# Patient Record
Sex: Female | Born: 1970
Health system: Southern US, Community
[De-identification: ages and names within clinical notes are randomized; demographics above are authoritative.]

## PROBLEM LIST (undated history)

## (undated) DIAGNOSIS — F32A Depression, unspecified: Secondary | ICD-10-CM

## (undated) DIAGNOSIS — A499 Bacterial infection, unspecified: Secondary | ICD-10-CM

## (undated) DIAGNOSIS — J45909 Unspecified asthma, uncomplicated: Secondary | ICD-10-CM

## (undated) DIAGNOSIS — Z87442 Personal history of urinary calculi: Secondary | ICD-10-CM

## (undated) DIAGNOSIS — F329 Major depressive disorder, single episode, unspecified: Secondary | ICD-10-CM

## (undated) DIAGNOSIS — K219 Gastro-esophageal reflux disease without esophagitis: Secondary | ICD-10-CM

## (undated) DIAGNOSIS — I10 Essential (primary) hypertension: Secondary | ICD-10-CM

## (undated) DIAGNOSIS — K5792 Diverticulitis of intestine, part unspecified, without perforation or abscess without bleeding: Secondary | ICD-10-CM

## (undated) DIAGNOSIS — Z8711 Personal history of peptic ulcer disease: Secondary | ICD-10-CM

## (undated) DIAGNOSIS — E785 Hyperlipidemia, unspecified: Secondary | ICD-10-CM

## (undated) DIAGNOSIS — Z8719 Personal history of other diseases of the digestive system: Secondary | ICD-10-CM

## (undated) DIAGNOSIS — R519 Headache, unspecified: Secondary | ICD-10-CM

## (undated) DIAGNOSIS — R51 Headache: Secondary | ICD-10-CM

## (undated) DIAGNOSIS — N390 Urinary tract infection, site not specified: Secondary | ICD-10-CM

## (undated) HISTORY — DX: Essential (primary) hypertension: I10

## (undated) HISTORY — DX: Bacterial infection, unspecified: N39.0

## (undated) HISTORY — DX: Bacterial infection, unspecified: A49.9

## (undated) HISTORY — PX: TONSILLECTOMY: SUR1361

## (undated) HISTORY — DX: Personal history of peptic ulcer disease: Z87.11

## (undated) HISTORY — DX: Personal history of other diseases of the digestive system: Z87.19

## (undated) HISTORY — DX: Unspecified asthma, uncomplicated: J45.909

## (undated) HISTORY — DX: Gastro-esophageal reflux disease without esophagitis: K21.9

## (undated) HISTORY — DX: Personal history of urinary calculi: Z87.442

## (undated) HISTORY — DX: Headache: R51

## (undated) HISTORY — DX: Hyperlipidemia, unspecified: E78.5

## (undated) HISTORY — DX: Major depressive disorder, single episode, unspecified: F32.9

## (undated) HISTORY — DX: Diverticulitis of intestine, part unspecified, without perforation or abscess without bleeding: K57.92

## (undated) HISTORY — PX: LEEP: SHX91

## (undated) HISTORY — DX: Headache, unspecified: R51.9

## (undated) HISTORY — DX: Depression, unspecified: F32.A

---
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
## 2001-07-06 ENCOUNTER — Inpatient Hospital Stay (HOSPITAL_COMMUNITY): Admission: AD | Admit: 2001-07-06 | Discharge: 2001-07-08 | Payer: Self-pay | Admitting: *Deleted

## 2001-07-07 ENCOUNTER — Encounter: Payer: Self-pay | Admitting: Obstetrics & Gynecology

## 2003-07-26 HISTORY — PX: ABDOMINAL HYSTERECTOMY: SHX81

## 2010-07-25 HISTORY — PX: HERNIA REPAIR: SHX51

## 2010-11-11 ENCOUNTER — Other Ambulatory Visit: Payer: Self-pay | Admitting: Surgery

## 2010-11-19 ENCOUNTER — Ambulatory Visit: Payer: Self-pay | Admitting: Surgery

## 2011-06-03 ENCOUNTER — Ambulatory Visit: Payer: Self-pay | Admitting: Unknown Physician Specialty

## 2011-06-06 ENCOUNTER — Ambulatory Visit: Payer: Self-pay | Admitting: Unknown Physician Specialty

## 2011-06-07 LAB — PATHOLOGY REPORT

## 2011-11-28 LAB — HM COLONOSCOPY: HM Colonoscopy: NEGATIVE

## 2013-11-25 LAB — HM PAP SMEAR: HM Pap smear: NORMAL

## 2013-11-25 LAB — HM MAMMOGRAPHY: HM Mammogram: NORMAL

## 2015-04-24 ENCOUNTER — Ambulatory Visit (INDEPENDENT_AMBULATORY_CARE_PROVIDER_SITE_OTHER): Payer: 59 | Admitting: Nurse Practitioner

## 2015-04-24 ENCOUNTER — Ambulatory Visit (INDEPENDENT_AMBULATORY_CARE_PROVIDER_SITE_OTHER)
Admission: RE | Admit: 2015-04-24 | Discharge: 2015-04-24 | Disposition: A | Discharge status: Home / Self Care | Payer: 59 | Source: Ambulatory Visit | Attending: Nurse Practitioner | Admitting: Nurse Practitioner

## 2015-04-24 ENCOUNTER — Encounter: Payer: Self-pay | Admitting: Nurse Practitioner

## 2015-04-24 VITALS — BP 118/88 | HR 92 | Temp 98.4°F | Resp 14 | Ht 62.25 in | Wt 139.2 lb

## 2015-04-24 DIAGNOSIS — R51 Headache: Secondary | ICD-10-CM

## 2015-04-24 DIAGNOSIS — M25561 Pain in right knee: Secondary | ICD-10-CM | POA: Insufficient documentation

## 2015-04-24 DIAGNOSIS — E663 Overweight: Secondary | ICD-10-CM

## 2015-04-24 DIAGNOSIS — Z8711 Personal history of peptic ulcer disease: Secondary | ICD-10-CM

## 2015-04-24 DIAGNOSIS — K219 Gastro-esophageal reflux disease without esophagitis: Secondary | ICD-10-CM | POA: Insufficient documentation

## 2015-04-24 DIAGNOSIS — E1169 Type 2 diabetes mellitus with other specified complication: Secondary | ICD-10-CM | POA: Insufficient documentation

## 2015-04-24 DIAGNOSIS — K5730 Diverticulosis of large intestine without perforation or abscess without bleeding: Secondary | ICD-10-CM | POA: Insufficient documentation

## 2015-04-24 DIAGNOSIS — R519 Headache, unspecified: Secondary | ICD-10-CM | POA: Insufficient documentation

## 2015-04-24 DIAGNOSIS — Z23 Encounter for immunization: Secondary | ICD-10-CM | POA: Diagnosis not present

## 2015-04-24 DIAGNOSIS — Z8709 Personal history of other diseases of the respiratory system: Secondary | ICD-10-CM | POA: Insufficient documentation

## 2015-04-24 DIAGNOSIS — I10 Essential (primary) hypertension: Secondary | ICD-10-CM | POA: Insufficient documentation

## 2015-04-24 DIAGNOSIS — F418 Other specified anxiety disorders: Secondary | ICD-10-CM | POA: Diagnosis not present

## 2015-04-24 DIAGNOSIS — M25562 Pain in left knee: Secondary | ICD-10-CM | POA: Insufficient documentation

## 2015-04-24 DIAGNOSIS — Z87442 Personal history of urinary calculi: Secondary | ICD-10-CM | POA: Insufficient documentation

## 2015-04-24 DIAGNOSIS — E785 Hyperlipidemia, unspecified: Secondary | ICD-10-CM | POA: Insufficient documentation

## 2015-04-24 DIAGNOSIS — Z7689 Persons encountering health services in other specified circumstances: Secondary | ICD-10-CM | POA: Insufficient documentation

## 2015-04-24 DIAGNOSIS — Z8719 Personal history of other diseases of the digestive system: Secondary | ICD-10-CM

## 2015-04-24 DIAGNOSIS — Z7189 Other specified counseling: Secondary | ICD-10-CM

## 2015-04-24 MED ORDER — ESCITALOPRAM OXALATE 10 MG PO TABS: 10.0000 mg | ORAL_TABLET | Freq: Every day | ORAL | Status: DC

## 2015-04-24 MED ORDER — BUSPIRONE HCL 7.5 MG PO TABS: 7.5000 mg | ORAL_TABLET | Freq: Three times a day (TID) | ORAL | Status: DC

## 2015-04-24 NOTE — Patient Instructions (Addendum)
[place] at [facility] Practice Address:  Address: [address] [place], [place] [address]  [phone] X-rays until 4 pm.   See you in 1 month, nice to meet you!

## 2015-04-24 NOTE — Assessment & Plan Note (Signed)
No formal diet or exercise. Pt is a vegetarian. Encouraged exercise.

## 2015-04-24 NOTE — Assessment & Plan Note (Signed)
Will follow. Need records with last labs

## 2015-04-24 NOTE — Assessment & Plan Note (Signed)
No recent concerns. Will watch NSAIDs and prednisone use

## 2015-04-24 NOTE — Progress Notes (Signed)
Pre visit review using our clinic review tool, if applicable. No additional management support is needed unless otherwise documented below in the visit note. 

## 2015-04-24 NOTE — Assessment & Plan Note (Signed)
Switching off of Celexa. Will follow up in 1 month

## 2015-04-24 NOTE — Assessment & Plan Note (Signed)
Colonoscopy in 2013. No concerns since then.

## 2015-04-24 NOTE — Assessment & Plan Note (Addendum)
Wt Readings from Last 3 Encounters:  04/24/15 139 lb 3.2 oz (63.141 kg)   BMI 25. Pt is not doing any formal diet or exercise at this time. Encouraged both. Will follow.

## 2015-04-24 NOTE — Assessment & Plan Note (Signed)
GERD is stable. No formal medications used. Pt tried to control with diet

## 2015-04-24 NOTE — Progress Notes (Signed)
Patient ID: Annette Gonzales, female    DOB: April 16, 1971  Age: 44 y.o. MRN: [id number]  CC: Establish Care   HPI Annette Gonzales presents for establishing care today and CC of Celexa giving her headaches.  1) New pt info:   Immunizations- Unknown, will obtain records  Mammogram- 2015   Pap- 2015, abnormal 2001, LEEP   Colonoscopy- 2013  Eye Exam- not UTD  Dental Exam- Needs to establish care   LMP- 2005 Partial hysterectomy (Still has Cervix)   2) Chronic Problems-  Asthma- Early 20's, not used inhaler in years   Depression/Anxiety- Zoloft and Wellbutrin were not helpful, Celexa- headache, but good results, taking 30 mg, seeing Dr. On demand, Klonopin by Dr. Laurel Dimmer.   Diverticulosis- Colonoscopy in 2013, no "itis"   Frequent HA- 2 months of taking Celexa and having the headaches- frontal to back of skull "like a hat".   GERD- Intermittent, worsening, taking Raw potato and helpful   Ulcers- Long ago 1 episode  Arrhythmias- Tachycardia borderline   HTN/HLD- High triglycerides   Kidney stones- 2 episodes this year  Overweight-  Vegetarian- eats some fish  Exercise- no formal   3) Acute Problems-  Headaches- Celexa stopped it today and no headache   Willing to switch to something else   Right knee- worsening over 2 weeks, grinding, swelling, can't climb steps- very painful.   Dr. Gillian Scarce- Dr. On Demand  Dr. Sima Matas- Gynecology   History Avalie has a past medical history of Asthma; Depression; Diverticulitis; Frequent headaches; GERD (gastroesophageal reflux disease); Hypertension; Hyperlipidemia; Cardiac arrhythmia due to congenital heart disease; History of kidney stones; Urinary tract bacterial infections; and History of stomach ulcers (history of mouth ulcers).   She has past surgical history that includes Appendectomy (1985); Tonsillectomy and adenoidectomy (1989); Abdominal hysterectomy (2005); and Hernia repair (2012).   Her family history includes Arthritis in her  maternal grandmother, mother, and paternal grandmother; Cancer in her maternal grandfather and mother; Diabetes in her maternal grandfather; Heart disease in her father and maternal grandfather; Hyperlipidemia in her father; Hypertension in her father; Mental illness in her maternal grandmother; Stroke in her father and maternal grandfather.She reports that she has never smoked. She has never used smokeless tobacco. She reports that she drinks alcohol. She reports that she does not use illicit drugs.  No outpatient prescriptions prior to visit.   No facility-administered medications prior to visit.    ROS Review of Systems  Constitutional: Negative for fever, chills, diaphoresis and fatigue.  Respiratory: Negative for chest tightness, shortness of breath and wheezing.   Cardiovascular: Negative for chest pain, palpitations and leg swelling.  Gastrointestinal: Negative for nausea, vomiting and diarrhea.  Skin: Negative for rash.  Neurological: Negative for dizziness, weakness, numbness and headaches.  Psychiatric/Behavioral: The patient is not nervous/anxious.     Objective:  BP 118/88 mmHg  Pulse 92  Temp(Src) 98.4 F (36.9 C)  Resp 14  Ht 5' 2.25" (1.581 m)  Wt 139 lb 3.2 oz (63.141 kg)  BMI 25.26 kg/m2  SpO2 97%  Physical Exam  Constitutional: She is oriented to person, place, and time. She appears well-developed and well-nourished. No distress.  HENT:  Head: Normocephalic and atraumatic.  Right Ear: External ear normal.  Left Ear: External ear normal.  Cardiovascular: Normal rate, regular rhythm and normal heart sounds.   Pulmonary/Chest: Effort normal and breath sounds normal. No respiratory distress. She has no wheezes. She has no rales. She exhibits no tenderness.  Neurological: She is alert  and oriented to person, place, and time. No cranial nerve deficit. She exhibits normal muscle tone. Coordination normal.  Skin: Skin is warm and dry. No rash noted. She is not  diaphoretic.  Psychiatric: She has a normal mood and affect. Her behavior is normal. Judgment and thought content normal.   Assessment & Plan:   Annette Gonzales was seen today for establish care.  Diagnoses and all orders for this visit:  Right knee pain -     DG Knee Complete 4 Views Right; Future  History of asthma  Depression with anxiety  Diverticulosis of colon without hemorrhage  Frequent headaches  Gastroesophageal reflux disease without esophagitis  History of gastric ulcer  Benign essential HTN  HLD (hyperlipidemia)  History of nephrolithiasis  Overweight  Encounter to establish care  Other orders -     busPIRone (BUSPAR) 7.5 MG tablet; Take 1 tablet (7.5 mg total) by mouth 3 (three) times daily. -     escitalopram (LEXAPRO) 10 MG tablet; Take 1 tablet (10 mg total) by mouth daily.   I have discontinued Ms. Ode's citalopram, citalopram, and clonazePAM. I am also having her start on busPIRone and escitalopram.  Meds ordered this encounter  Medications  . DISCONTD: citalopram (CELEXA) 20 MG tablet    Sig: Take by mouth daily.    Refill:  2  . DISCONTD: citalopram (CELEXA) 10 MG tablet    Sig: Take by mouth daily.    Refill:  2  . DISCONTD: clonazePAM (KLONOPIN) 1 MG tablet    Sig: Take by mouth as needed.  . busPIRone (BUSPAR) 7.5 MG tablet    Sig: Take 1 tablet (7.5 mg total) by mouth 3 (three) times daily.    Dispense:  90 tablet    Refill:  0    Order Specific Question:  Supervising Marica Trentham    Answer:  Duncan Dull L [[id number]]  . escitalopram (LEXAPRO) 10 MG tablet    Sig: Take 1 tablet (10 mg total) by mouth daily.    Dispense:  30 tablet    Refill:  0    Order Specific Question:  Supervising Ozzy Bohlken    Answer:  Sherlene Shams [[id number]]     Follow-up: Return in about 1 month (around 05/24/2015) for Follow up .

## 2015-04-24 NOTE — Assessment & Plan Note (Signed)
2 episodes this year. Will follow

## 2015-04-24 NOTE — Assessment & Plan Note (Signed)
Discussed acute and chronic issues. Reviewed health maintenance measures, PFSHx, and immunizations. Obtain records from previous facility.   

## 2015-04-24 NOTE — Assessment & Plan Note (Signed)
Pt did not like Celexa due to frequent headaches. She has been on this for 2 months and stopped it this morning and did not have a headache. Will switch (after discussion) to Lexapro 10 mg daily. Stopping Klonopin and switching to Buspar. Will follow up next month

## 2015-04-24 NOTE — Assessment & Plan Note (Signed)
Stable. No inhalers since early 20's. Will follow

## 2015-04-24 NOTE — Assessment & Plan Note (Signed)
Right knee pain worsening over 2 weeks, decreased ROM, swelling. Failed NSAIDs, elevation, and ice. Will obtain right knee exercise.

## 2015-05-06 ENCOUNTER — Encounter: Payer: Self-pay | Admitting: Nurse Practitioner

## 2015-05-16 ENCOUNTER — Emergency Department
Admission: EM | Admit: 2015-05-16 | Discharge: 2015-05-16 | Disposition: A | Discharge status: Home / Self Care | Payer: 59 | Attending: Emergency Medicine | Admitting: Emergency Medicine

## 2015-05-16 ENCOUNTER — Emergency Department: Payer: 59

## 2015-05-16 DIAGNOSIS — Z79899 Other long term (current) drug therapy: Secondary | ICD-10-CM | POA: Diagnosis not present

## 2015-05-16 DIAGNOSIS — R110 Nausea: Secondary | ICD-10-CM | POA: Insufficient documentation

## 2015-05-16 DIAGNOSIS — R109 Unspecified abdominal pain: Secondary | ICD-10-CM | POA: Diagnosis present

## 2015-05-16 DIAGNOSIS — N390 Urinary tract infection, site not specified: Secondary | ICD-10-CM | POA: Insufficient documentation

## 2015-05-16 DIAGNOSIS — I10 Essential (primary) hypertension: Secondary | ICD-10-CM | POA: Insufficient documentation

## 2015-05-16 LAB — URINALYSIS COMPLETE WITH MICROSCOPIC (ARMC ONLY)
BILIRUBIN URINE: NEGATIVE
GLUCOSE, UA: NEGATIVE mg/dL
HGB URINE DIPSTICK: NEGATIVE
Ketones, ur: NEGATIVE mg/dL
NITRITE: NEGATIVE
PH: 7 (ref 5.0–8.0)
Protein, ur: NEGATIVE mg/dL
SPECIFIC GRAVITY, URINE: 1.015 (ref 1.005–1.030)

## 2015-05-16 LAB — CBC
HEMATOCRIT: 41.9 % (ref 35.0–47.0)
Hemoglobin: 14.1 g/dL (ref 12.0–16.0)
MCH: 28.5 pg (ref 26.0–34.0)
MCHC: 33.7 g/dL (ref 32.0–36.0)
MCV: 84.8 fL (ref 80.0–100.0)
Platelets: 271 10*3/uL (ref 150–440)
RBC: 4.94 MIL/uL (ref 3.80–5.20)
RDW: 13.3 % (ref 11.5–14.5)
WBC: 8 10*3/uL (ref 3.6–11.0)

## 2015-05-16 LAB — COMPREHENSIVE METABOLIC PANEL
ALT: 24 U/L (ref 14–54)
ANION GAP: 8 (ref 5–15)
AST: 28 U/L (ref 15–41)
Albumin: 4.2 g/dL (ref 3.5–5.0)
Alkaline Phosphatase: 78 U/L (ref 38–126)
BUN: 14 mg/dL (ref 6–20)
CO2: 24 mmol/L (ref 22–32)
Calcium: 8.9 mg/dL (ref 8.9–10.3)
Chloride: 106 mmol/L (ref 101–111)
Creatinine, Ser: 0.89 mg/dL (ref 0.44–1.00)
GFR calc non Af Amer: >60 mL/min (ref >60)
GLUCOSE: 119 mg/dL — AB (ref 65–99)
POTASSIUM: 3.8 mmol/L (ref 3.5–5.1)
Sodium: 138 mmol/L (ref 135–145)
TOTAL PROTEIN: 7.1 g/dL (ref 6.5–8.1)
Total Bilirubin: 0.5 mg/dL (ref 0.3–1.2)

## 2015-05-16 MED ORDER — MORPHINE SULFATE (PF) 2 MG/ML IV SOLN
INTRAVENOUS | Status: AC
  Administered 2015-05-16: 2 mg via INTRAVENOUS
  Filled 2015-05-16: qty 1

## 2015-05-16 MED ORDER — ONDANSETRON HCL 4 MG/2ML IJ SOLN
INTRAMUSCULAR | Status: AC
  Administered 2015-05-16: 4 mg
  Filled 2015-05-16: qty 2

## 2015-05-16 MED ORDER — HYDROCODONE-ACETAMINOPHEN 5-325 MG PO TABS: 1.0000 | ORAL_TABLET | ORAL | Status: DC | PRN

## 2015-05-16 MED ORDER — MORPHINE SULFATE (PF) 2 MG/ML IV SOLN
2.0000 mg | Freq: Once | INTRAVENOUS | Status: AC
  Administered 2015-05-16: 2 mg via INTRAVENOUS

## 2015-05-16 MED ORDER — KETOROLAC TROMETHAMINE 30 MG/ML IJ SOLN
30.0000 mg | Freq: Once | INTRAMUSCULAR | Status: AC
  Administered 2015-05-16: 30 mg via INTRAVENOUS
  Filled 2015-05-16: qty 1

## 2015-05-16 MED ORDER — SODIUM CHLORIDE 0.9 % IV SOLN
1000.0000 mL | Freq: Once | INTRAVENOUS | Status: AC
  Administered 2015-05-16: 1000 mL via INTRAVENOUS

## 2015-05-16 MED ORDER — CIPROFLOXACIN HCL 500 MG PO TABS: 500.0000 mg | ORAL_TABLET | Freq: Two times a day (BID) | ORAL | Status: DC

## 2015-05-16 NOTE — ED Notes (Signed)
Patient medicated for pain. To CT for renal study.

## 2015-05-16 NOTE — Discharge Instructions (Signed)
Flank Pain Flank pain is pain in your side. The flank is the area of your side between your upper belly (abdomen) and your back. Pain in this area can be caused by many different things. HOME CARE Home care and treatment will depend on the cause of your pain.  Rest as told by your doctor.  Drink enough fluids to keep your pee (urine) clear or pale yellow.  Only take medicine as told by your doctor.  Tell your doctor about any changes in your pain.  Follow up with your doctor. GET HELP RIGHT AWAY IF:   Your pain does not get better with medicine.   You have new symptoms or your symptoms get worse.  Your pain gets worse.   You have belly (abdominal) pain.   You are short of breath.   You always feel sick to your stomach (nauseous).   You keep throwing up (vomiting).   You have puffiness (swelling) in your belly.   You feel light-headed or you pass out (faint).   You have blood in your pee.  You have a fever or lasting symptoms for more than 2-3 days.  You have a fever and your symptoms suddenly get worse. MAKE SURE YOU:   Understand these instructions.  Will watch your condition.  Will get help right away if you are not doing well or get worse.   This information is not intended to replace advice given to you by your health care provider. Make sure you discuss any questions you have with your health care provider.   Document Released: 04/19/2008 Document Revised: 08/01/2014 Document Reviewed: 02/23/2012 Elsevier Interactive Patient Education 2016 Else[employer] Inc[employer]  Urinary Tract Infection Urinary tract infections (UTIs) can develop anywhere along your urinary tract. Your urinary tract is your body's drainage system for removing wastes and extra water. Your urinary tract includes two kidneys, two ureters, a bladder, and a urethra. Your kidneys are a pair of bean-shaped organs. Each kidney is about the size of your fist. They are located below your ribs, one on  each side of your spine. CAUSES Infections are caused by microbes, which are microscopic organisms, including fungi, viruses, and bacteria. These organisms are so small that they can only be seen through a microscope. Bacteria are the microbes that most commonly cause UTIs. SYMPTOMS  Symptoms of UTIs may vary by age and gender of the patient and by the location of the infection. Symptoms in young women typically include a frequent and intense urge to urinate and a painful, burning feeling in the bladder or urethra during urination. Older women and men are more likely to be tired, shaky, and weak and have muscle aches and abdominal pain. A fever may mean the infection is in your kidneys. Other symptoms of a kidney infection include pain in your back or sides below the ribs, nausea, and vomiting. DIAGNOSIS To diagnose a UTI, your caregiver will ask you about your symptoms. Your caregiver will also ask you to provide a urine sample. The urine sample will be tested for bacteria and white blood cells. White blood cells are made by your body to help fight infection. TREATMENT  Typically, UTIs can be treated with medication. Because most UTIs are caused by a bacterial infection, they usually can be treated with the use of antibiotics. The choice of antibiotic and length of treatment depend on your symptoms and the type of bacteria causing your infection. HOME CARE INSTRUCTIONS  If you were prescribed antibiotics, take them exactly as  your caregiver instructs you. Finish the medication even if you feel better after you have only taken some of the medication.  Drink enough water and fluids to keep your urine clear or pale yellow.  Avoid caffeine, tea, and carbonated beverages. They tend to irritate your bladder.  Empty your bladder often. Avoid holding urine for long periods of time.  Empty your bladder before and after sexual intercourse.  After a bowel movement, women should cleanse from front to back.  Use each tissue only once. SEEK MEDICAL CARE IF:   You have back pain.  You develop a fever.  Your symptoms do not begin to resolve within 3 days. SEEK IMMEDIATE MEDICAL CARE IF:   You have severe back pain or lower abdominal pain.  You develop chills.  You have nausea or vomiting.  You have continued burning or discomfort with urination. MAKE SURE YOU:   Understand these instructions.  Will watch your condition.  Will get help right away if you are not doing well or get worse.   This information is not intended to replace advice given to you by your health care provider. Make sure you discuss any questions you have with your health care provider.   Document Released: 04/20/2005 Document Revised: 04/01/2015 Document Reviewed: 08/19/2011 Elsevier Interactive Patient Education [employer]

## 2015-05-16 NOTE — ED Notes (Signed)
Pt c/o right flank pain x 1 day with nausea.

## 2015-05-16 NOTE — ED Provider Notes (Signed)
[facility] Emergency Department Provider Note  ____________________________________________  Time seen: On arrival  I have reviewed the triage vital signs and the nursing notes.   HISTORY  Chief Complaint Flank Pain    HPI Annette Gonzales is a 44 y.o. female who reports she developed right flank pain at approximately 2 AM last night. She reports the pain is moderate to severe and cramping in nature. She reports mild nausea. She has had flank pain before related to a kidney infection but she reports this feels different. She denies fevers. She denies dysuria. She denies hematuria. No abdominal pain. Lying on the right flank makes it worse. Nothing makes it better. She felt well yesterday     Past Medical History  Diagnosis Date  . Asthma   . Depression   . Diverticulitis   . Frequent headaches   . GERD (gastroesophageal reflux disease)   . Hypertension   . Hyperlipidemia   . Cardiac arrhythmia due to congenital heart disease   . History of kidney stones   . Urinary tract bacterial infections   . History of stomach ulcers history of mouth ulcers    Patient Active Problem List   Diagnosis Date Noted  . History of asthma 04/24/2015  . Depression with anxiety 04/24/2015  . Right knee pain 04/24/2015  . Diverticulosis of colon without hemorrhage 04/24/2015  . Frequent headaches 04/24/2015  . GERD (gastroesophageal reflux disease) 04/24/2015  . History of gastric ulcer 04/24/2015  . Benign essential HTN 04/24/2015  . HLD (hyperlipidemia) 04/24/2015  . History of nephrolithiasis 04/24/2015  . Overweight 04/24/2015  . Encounter to establish care 04/24/2015    Past Surgical History  Procedure Laterality Date  . Appendectomy  1985  . Tonsillectomy and adenoidectomy  1989  . Abdominal hysterectomy  2005    Uterus only  . Hernia repair  2012  . Leep      Current Outpatient Rx  Name  Route  Sig  Dispense  Refill  . busPIRone (BUSPAR) 7.5 MG  tablet   Oral   Take 1 tablet (7.5 mg total) by mouth 3 (three) times daily.   90 tablet   0   . escitalopram (LEXAPRO) 10 MG tablet   Oral   Take 1 tablet (10 mg total) by mouth daily.   30 tablet   0     Allergies Review of patient's allergies indicates no known allergies.  Family History  Problem Relation Age of Onset  . Arthritis Mother   . Cancer Mother     Breast Cancer  . Hyperlipidemia Father   . Heart disease Father   . Stroke Father   . Hypertension Father   . Arthritis Maternal Grandmother   . Mental illness Maternal Grandmother   . Cancer Maternal Grandfather     Colon/ Prostate Cancer  . Heart disease Maternal Grandfather   . Stroke Maternal Grandfather   . Diabetes Maternal Grandfather   . Arthritis Paternal Grandmother     Social History Social History  Substance Use Topics  . Smoking status: Never Smoker   . Smokeless tobacco: Never Used  . Alcohol Use: 0.0 oz/week    0 Standard drinks or equivalent per week    Review of Systems  Constitutional: Negative for fever. Eyes: Negative for visual changes. ENT: Negative for sore throat Cardiovascular: Negative for chest pain. Respiratory: Negative for shortness of breath. Gastrointestinal: Right flank pain, nausea Genitourinary: Negative for dysuria. Musculoskeletal: Negative for back pain. Skin: Negative for  rash. Neurological: Negative for headaches or focal weakness Psychiatric: No anxiety    ____________________________________________   PHYSICAL EXAM:  VITAL SIGNS: ED Triage Vitals  Enc Vitals Group     BP 05/16/15 1437 145/103 mmHg     Pulse Rate 05/16/15 1437 105     Resp 05/16/15 1437 22     Temp 05/16/15 1437 98.4 F (36.9 C)     Temp Source 05/16/15 1437 Oral     SpO2 05/16/15 1437 97 %     Weight 05/16/15 1437 138 lb (62.596 kg)     Height 05/16/15 1437  (1.6 m)     Head Cir --      Peak Flow --      Pain Score 05/16/15 1441 7     Pain Loc --      Pain Edu? --       Excl. in GC? --      Constitutional: Alert and oriented. Well appearing and in no distress. Eyes: Conjunctivae are normal.  ENT   Head: Normocephalic and atraumatic.   Mouth/Throat: Mucous membranes are moist. Cardiovascular: Normal rate, regular rhythm. Normal and symmetric distal pulses are present in all extremities. No murmurs, rubs, or gallops. Respiratory: Normal respiratory effort without tachypnea nor retractions. Breath sounds are clear and equal bilaterally.  Gastrointestinal: Soft and non-tender in all quadrants. No distention. Mild right CVA tenderness Genitourinary: deferred Musculoskeletal: Nontender with normal range of motion in all extremities. No lower extremity tenderness nor edema. Neurologic:  Normal speech and language. No gross focal neurologic deficits are appreciated. Skin:  Skin is warm, dry and intact. No rash noted. Psychiatric: Mood and affect are normal. Patient exhibits appropriate insight and judgment.  ____________________________________________    LABS (pertinent positives/negatives)  Labs Reviewed  CBC  COMPREHENSIVE METABOLIC PANEL  URINALYSIS COMPLETEWITH MICROSCOPIC ([facility] ONLY)    ____________________________________________   EKG  None  ____________________________________________    RADIOLOGY I have personally reviewed any xrays that were ordered on this patient: CT renal stone study shows no obstructing stones  ____________________________________________   PROCEDURES  Procedure(s) performed: none  Critical Care performed: none  ____________________________________________   INITIAL IMPRESSION / ASSESSMENT AND PLAN / ED COURSE  Pertinent labs & imaging results that were available during my care of the patient were reviewed by me and considered in my medical decision making (see chart for details).  Patient overall well-appearing. She is mildly tachycardic and complains of right flank pain with some  nausea. Suspicious for ureterolithiasis. We will give IV Toradol, fluids check urine and CT if necessary   ----------------------------------------- 6:23 PM on 05/16/2015 -----------------------------------------  CT unremarkable. Urine suspicious for early urinary tract infection as cause of pain. Labs are benign. Heart rate improved with fluids and pain medication. We will treat the patient with antibiotics and have her follow-up with PCP. I discussed return precautions with her she agrees ____________________________________________   FINAL CLINICAL IMPRESSION(S) / ED DIAGNOSES  Final diagnoses:  Flank pain, acute  UTI (lower urinary tract infection)     Jene Every, MD 05/16/15 1825

## 2015-05-18 ENCOUNTER — Ambulatory Visit (INDEPENDENT_AMBULATORY_CARE_PROVIDER_SITE_OTHER): Payer: 59 | Admitting: Nurse Practitioner

## 2015-05-18 VITALS — BP 112/80 | HR 87 | Temp 98.6°F | Resp 14 | Ht 62.25 in | Wt 140.6 lb

## 2015-05-18 DIAGNOSIS — Z87442 Personal history of urinary calculi: Secondary | ICD-10-CM

## 2015-05-18 DIAGNOSIS — F418 Other specified anxiety disorders: Secondary | ICD-10-CM | POA: Diagnosis not present

## 2015-05-18 MED ORDER — ESCITALOPRAM OXALATE 10 MG PO TABS: 10.0000 mg | ORAL_TABLET | Freq: Every day | ORAL | Status: DC

## 2015-05-18 NOTE — Progress Notes (Signed)
Patient ID: Annette Gonzales Davies, female    DOB: 03/12/1971  Age: 44 y.o. MRN: [id number]  CC: Follow-up   HPI Annette Gonzales Jeansonne presents for ER follow up from UTI.   1) [facility]16/[facility] for right flank pain severe.  CT renal stone study showed no obstructing stones  Given IV toradol, fluids, and given antibiotics for possible UTI.   Left ovarian cyst 3.6 cm, tiny supraumbilical ventral hernia, non-obstructing renal calculi bilaterally.   2) Lexapro- needing refill, pt reports it is helpful and denies side effects or suicidal ideations.   History Juliette AlcideMelinda has a past medical history of Asthma; Depression; Diverticulitis; Frequent headaches; GERD (gastroesophageal reflux disease); Hypertension; Hyperlipidemia; Cardiac arrhythmia due to congenital heart disease; History of kidney stones; Urinary tract bacterial infections; and History of stomach ulcers (history of mouth ulcers).   She has past surgical history that includes Appendectomy (1985); Tonsillectomy and adenoidectomy (1989); Abdominal hysterectomy (2005); Hernia repair (2012); and LEEP.   Her family history includes Arthritis in her maternal grandmother, mother, and paternal grandmother; Cancer in her maternal grandfather and mother; Diabetes in her maternal grandfather; Heart disease in her father and maternal grandfather; Hyperlipidemia in her father; Hypertension in her father; Mental illness in her maternal grandmother; Stroke in her father and maternal grandfather.She reports that she has never smoked. She has never used smokeless tobacco. She reports that she drinks alcohol. She reports that she does not use illicit drugs.  Outpatient Prescriptions Prior to Visit  Medication Sig Dispense Refill  . busPIRone (BUSPAR) 7.5 MG tablet Take 1 tablet (7.5 mg total) by mouth 3 (three) times daily. 90 tablet 0  . ciprofloxacin (CIPRO) 500 MG tablet Take 1 tablet (500 mg total) by mouth 2 (two) times daily. 14 tablet 0  .  HYDROcodone-acetaminophen (NORCO/VICODIN) 5-325 MG tablet Take 1 tablet by mouth every 4 (four) hours as needed for moderate pain. 20 tablet 0  . escitalopram (LEXAPRO) 10 MG tablet Take 1 tablet (10 mg total) by mouth daily. 30 tablet 0   No facility-administered medications prior to visit.    ROS Review of Systems  Constitutional: Negative for fever, chills, diaphoresis and fatigue.  Respiratory: Negative for chest tightness, shortness of breath and wheezing.   Cardiovascular: Negative for chest pain, palpitations and leg swelling.  Gastrointestinal: Negative for nausea, vomiting and diarrhea.  Genitourinary: Positive for flank pain. Negative for hematuria and difficulty urinating.  Skin: Negative for rash.  Neurological: Negative for dizziness, weakness, numbness and headaches.  Psychiatric/Behavioral: The patient is nervous/anxious.        Helped by Lexapro    Objective:  BP 112/80 mmHg  Pulse 87  Temp(Src) 98.6 F (37 C)  Resp 14  Ht 5' 2.25" (1.581 m)  Wt 140 lb 9.6 oz (63.776 kg)  BMI 25.51 kg/m2  SpO2 96%  Physical Exam  Constitutional: She is oriented to person, place, and time. She appears well-developed and well-nourished. No distress.  HENT:  Head: Normocephalic and atraumatic.  Right Ear: External ear normal.  Left Ear: External ear normal.  Cardiovascular: Normal rate, regular rhythm and normal heart sounds.  Exam reveals no gallop and no friction rub.   No murmur heard. Pulmonary/Chest: Effort normal and breath sounds normal. No respiratory distress. She has no wheezes. She has no rales. She exhibits no tenderness.  Abdominal: There is CVA tenderness.  Neurological: She is alert and oriented to person, place, and time. No cranial nerve deficit. She exhibits normal muscle tone. Coordination  normal.  Skin: Skin is warm and dry. No rash noted. She is not diaphoretic.  Psychiatric: She has a normal mood and affect. Her behavior is normal. Judgment and thought  content normal.   Assessment & Plan:   Laresha was seen today for follow-up.  Diagnoses and all orders for this visit:  History of nephrolithiasis -     Ambulatory referral to Urology  Depression with anxiety  Other orders -     escitalopram (LEXAPRO) 10 MG tablet; Take 1 tablet (10 mg total) by mouth daily.   I am having Ms. Abate maintain her busPIRone, HYDROcodone-acetaminophen, ciprofloxacin, and escitalopram.  Meds ordered this encounter  Medications  . escitalopram (LEXAPRO) 10 MG tablet    Sig: Take 1 tablet (10 mg total) by mouth daily.    Dispense:  90 tablet    Refill:  1    Order Specific Question:  Supervising Provider    Answer:  Sherlene Shams [[id number]]     Follow-up: Return if symptoms worsen or fail to improve.

## 2015-05-18 NOTE — Patient Instructions (Signed)
Kidney Stones °Kidney stones (urolithiasis) are deposits that form inside your kidneys. The intense pain is caused by the stone moving through the urinary tract. When the stone moves, the ureter goes into spasm around the stone. The stone is usually passed in the urine.  °CAUSES  °· A disorder that makes certain neck glands produce too much parathyroid hormone (primary hyperparathyroidism). °· A buildup of uric acid crystals, similar to gout in your joints. °· Narrowing (stricture) of the ureter. °· A kidney obstruction present at birth (congenital obstruction). °· Previous surgery on the kidney or ureters. °· Numerous kidney infections. °SYMPTOMS  °· Feeling sick to your stomach (nauseous). °· Throwing up (vomiting). °· Blood in the urine (hematuria). °· Pain that usually spreads (radiates) to the groin. °· Frequency or urgency of urination. °DIAGNOSIS  °· Taking a history and physical exam. °· Blood or urine tests. °· CT scan. °· Occasionally, an examination of the inside of the urinary bladder (cystoscopy) is performed. °TREATMENT  °· Observation. °· Increasing your fluid intake. °· Extracorporeal shock wave lithotripsy--This is a noninvasive procedure that uses shock waves to break up kidney stones. °· Surgery may be needed if you have severe pain or persistent obstruction. There are various surgical procedures. Most of the procedures are performed with the use of small instruments. Only small incisions are needed to accommodate these instruments, so recovery time is minimized. °The size, location, and chemical composition are all important variables that will determine the proper choice of action for you. Talk to your health care provider to better understand your situation so that you will minimize the risk of injury to yourself and your kidney.  °HOME CARE INSTRUCTIONS  °· Drink enough water and fluids to keep your urine clear or pale yellow. This will help you to pass the stone or stone fragments. °· Strain  all urine through the provided strainer. Keep all particulate matter and stones for your health care provider to see. The stone causing the pain may be as small as a grain of salt. It is very important to use the strainer each and every time you pass your urine. The collection of your stone will allow your health care provider to analyze it and verify that a stone has actually passed. The stone analysis will often identify what you can do to reduce the incidence of recurrences. °· Only take over-the-counter or prescription medicines for pain, discomfort, or fever as directed by your health care provider. °· Keep all follow-up visits as told by your health care provider. This is important. °· Get follow-up X-rays if required. The absence of pain does not always mean that the stone has passed. It may have only stopped moving. If the urine remains completely obstructed, it can cause loss of kidney function or even complete destruction of the kidney. It is your responsibility to make sure X-rays and follow-ups are completed. Ultrasounds of the kidney can show blockages and the status of the kidney. Ultrasounds are not associated with any radiation and can be performed easily in a matter of minutes. °· Make changes to your daily diet as told by your health care provider. You may be told to: °¨ Limit the amount of salt that you eat. °¨ Eat 5 or more servings of fruits and vegetables each day. °¨ Limit the amount of meat, poultry, fish, and eggs that you eat. °· Collect a 24-hour urine sample as told by your health care provider. You may need to collect another urine sample every 6-12   months. °SEEK MEDICAL CARE IF: °· You experience pain that is progressive and unresponsive to any pain medicine you have been prescribed. °SEEK IMMEDIATE MEDICAL CARE IF:  °· Pain cannot be controlled with the prescribed medicine. °· You have a fever or shaking chills. °· The severity or intensity of pain increases over 18 hours and is not  relieved by pain medicine. °· You develop a new onset of abdominal pain. °· You feel faint or pass out. °· You are unable to urinate. °  °This information is not intended to replace advice given to you by your health care provider. Make sure you discuss any questions you have with your health care provider. °  °Document Released: 07/11/2005 Document Revised: 04/01/2015 Document Reviewed: 12/12/2012 °Elsevier Interactive Patient Education ©2016 [employer] ° °

## 2015-05-18 NOTE — Progress Notes (Signed)
Pre visit review using our clinic review tool, if applicable. No additional management support is needed unless otherwise documented below in the visit note. 

## 2015-05-21 ENCOUNTER — Encounter: Payer: Self-pay | Admitting: Nurse Practitioner

## 2015-05-22 ENCOUNTER — Other Ambulatory Visit: Payer: Self-pay | Admitting: Nurse Practitioner

## 2015-05-22 DIAGNOSIS — Z87442 Personal history of urinary calculi: Secondary | ICD-10-CM

## 2015-05-25 ENCOUNTER — Ambulatory Visit: Payer: 59 | Admitting: Nurse Practitioner

## 2015-05-27 ENCOUNTER — Encounter: Payer: Self-pay | Admitting: Nurse Practitioner

## 2015-05-27 NOTE — Assessment & Plan Note (Signed)
Lexapro still helpful and she would like a refill today.

## 2015-05-27 NOTE — Assessment & Plan Note (Signed)
Referred to Urology for further work up and care. Pt still taking cipro. Will follow as needed. Discussed staying away from teas and drinking water with lemon.

## 2015-09-14 ENCOUNTER — Other Ambulatory Visit: Payer: Self-pay | Admitting: Family Medicine

## 2015-09-14 MED ORDER — OSELTAMIVIR PHOSPHATE 75 MG PO CAPS: 75.0000 mg | ORAL_CAPSULE | Freq: Every day | ORAL | Status: DC

## 2015-12-01 ENCOUNTER — Other Ambulatory Visit: Payer: Self-pay | Admitting: Nurse Practitioner

## 2015-12-01 ENCOUNTER — Telehealth: Payer: Self-pay | Admitting: *Deleted

## 2015-12-01 ENCOUNTER — Other Ambulatory Visit: Payer: Self-pay

## 2015-12-01 ENCOUNTER — Encounter: Payer: Self-pay | Admitting: Nurse Practitioner

## 2015-12-01 MED ORDER — BUSPIRONE HCL 7.5 MG PO TABS: 7.5000 mg | ORAL_TABLET | Freq: Three times a day (TID) | ORAL | Status: DC

## 2015-12-01 MED ORDER — ESCITALOPRAM OXALATE 10 MG PO TABS: 10.0000 mg | ORAL_TABLET | Freq: Every day | ORAL | Status: DC

## 2015-12-01 MED ORDER — HYDROCODONE-ACETAMINOPHEN 5-325 MG PO TABS
1.0000 | ORAL_TABLET | ORAL | Status: DC | PRN
Start: 2015-12-01 — End: 2016-04-21

## 2015-12-01 NOTE — Telephone Encounter (Signed)
Both refilled in 04/2015 and last seen 04/2015. Please advise? Patient wants refills before you leave.

## 2015-12-04 ENCOUNTER — Other Ambulatory Visit: Payer: Self-pay | Admitting: Nurse Practitioner

## 2016-04-21 ENCOUNTER — Emergency Department: Payer: 59

## 2016-04-21 ENCOUNTER — Encounter: Payer: Self-pay | Admitting: Emergency Medicine

## 2016-04-21 ENCOUNTER — Emergency Department
Admission: EM | Admit: 2016-04-21 | Discharge: 2016-04-21 | Disposition: A | Discharge status: Home / Self Care | Payer: 59 | Attending: Emergency Medicine | Admitting: Emergency Medicine

## 2016-04-21 DIAGNOSIS — R51 Headache: Secondary | ICD-10-CM | POA: Diagnosis present

## 2016-04-21 DIAGNOSIS — Z79899 Other long term (current) drug therapy: Secondary | ICD-10-CM | POA: Diagnosis not present

## 2016-04-21 DIAGNOSIS — J45909 Unspecified asthma, uncomplicated: Secondary | ICD-10-CM | POA: Insufficient documentation

## 2016-04-21 DIAGNOSIS — I10 Essential (primary) hypertension: Secondary | ICD-10-CM | POA: Diagnosis not present

## 2016-04-21 DIAGNOSIS — G4489 Other headache syndrome: Secondary | ICD-10-CM | POA: Insufficient documentation

## 2016-04-21 LAB — CBC
HCT: 42.3 % (ref 35.0–47.0)
HEMOGLOBIN: 15.1 g/dL (ref 12.0–16.0)
MCH: 30 pg (ref 26.0–34.0)
MCHC: 35.6 g/dL (ref 32.0–36.0)
MCV: 84.2 fL (ref 80.0–100.0)
Platelets: 282 10*3/uL (ref 150–440)
RBC: 5.03 MIL/uL (ref 3.80–5.20)
RDW: 13.6 % (ref 11.5–14.5)
WBC: 5.3 10*3/uL (ref 3.6–11.0)

## 2016-04-21 LAB — BASIC METABOLIC PANEL
ANION GAP: 5 (ref 5–15)
BUN: 11 mg/dL (ref 6–20)
CALCIUM: 8.8 mg/dL — AB (ref 8.9–10.3)
CO2: 26 mmol/L (ref 22–32)
Chloride: 107 mmol/L (ref 101–111)
Creatinine, Ser: 0.8 mg/dL (ref 0.44–1.00)
Glucose, Bld: 118 mg/dL — ABNORMAL HIGH (ref 65–99)
POTASSIUM: 4.1 mmol/L (ref 3.5–5.1)
Sodium: 138 mmol/L (ref 135–145)

## 2016-04-21 LAB — TROPONIN I

## 2016-04-21 MED ORDER — KETOROLAC TROMETHAMINE 30 MG/ML IJ SOLN
30.0000 mg | Freq: Once | INTRAMUSCULAR | Status: AC
  Administered 2016-04-21: 30 mg via INTRAVENOUS
  Filled 2016-04-21: qty 1

## 2016-04-21 MED ORDER — METOCLOPRAMIDE HCL 5 MG/ML IJ SOLN
10.0000 mg | Freq: Once | INTRAMUSCULAR | Status: AC
  Administered 2016-04-21: 10 mg via INTRAVENOUS
  Filled 2016-04-21: qty 2

## 2016-04-21 MED ORDER — BUTALBITAL-APAP-CAFFEINE 50-325-40 MG PO TABS: 1.0000 | ORAL_TABLET | Freq: Four times a day (QID) | ORAL | 0 refills | Status: DC | PRN

## 2016-04-21 NOTE — Discharge Instructions (Signed)
Reestablish a local health care provider for management of hypertension.

## 2016-04-21 NOTE — ED Triage Notes (Signed)
[facility] with headache /Dizziness/ htn since last night . Last night chest pain no chest pain currently  .

## 2016-04-21 NOTE — ED Notes (Signed)
CT completed and pt back in room

## 2016-04-21 NOTE — ED Notes (Addendum)
Patient reports onset of dizziness and chest pain last night. Denies any trauma. Patient hypertensive in ER, states she has had same at [facility] office (systolic 150's in past) and was told it was probably due to being anxious about being at office. Patient denies increase in dizziness with turning head or changes in position. No current chest pain.

## 2016-04-21 NOTE — ED Provider Notes (Signed)
[facility] Emergency Department Provider Note   ____________________________________________   None    (approximate)  I have reviewed the triage vital signs and the nursing notes.   HISTORY  Chief Complaint Headache    HPI Annette Gonzales is a 45 y.o. female presents as a referral from your clinic for evaluation of elevated blood pressure headache associated with dizziness. Patient reports she had chest pains last night. Cardiac workup was negative upon arrival. Still complaining of headache with dizziness. All vital signs were reviewed. She denies taking any medication for hypertension at this time. She denies having chest pains at this time.   Past Medical History:  Diagnosis Date  . Asthma   . Cardiac arrhythmia due to congenital heart disease   . Depression   . Diverticulitis   . Frequent headaches   . GERD (gastroesophageal reflux disease)   . History of kidney stones   . History of stomach ulcers history of mouth ulcers  . Hyperlipidemia   . Hypertension   . Urinary tract bacterial infections     Patient Active Problem List   Diagnosis Date Noted  . History of asthma 04/24/2015  . Depression with anxiety 04/24/2015  . Right knee pain 04/24/2015  . Diverticulosis of colon without hemorrhage 04/24/2015  . Frequent headaches 04/24/2015  . GERD (gastroesophageal reflux disease) 04/24/2015  . History of gastric ulcer 04/24/2015  . Benign essential HTN 04/24/2015  . HLD (hyperlipidemia) 04/24/2015  . History of nephrolithiasis 04/24/2015  . Overweight 04/24/2015  . Encounter to establish care 04/24/2015    Past Surgical History:  Procedure Laterality Date  . ABDOMINAL HYSTERECTOMY  2005   Uterus only  . APPENDECTOMY  1985  . HERNIA REPAIR  2012  . LEEP    . TONSILLECTOMY    . TONSILLECTOMY AND ADENOIDECTOMY  1989    Prior to Admission medications   Medication Sig Start Date End Date Taking? Authorizing Provider  busPIRone  (BUSPAR) 7.5 MG tablet Take 1 tablet (7.5 mg total) by mouth 3 (three) times daily. 12/01/15   Carollee Leitz, NP  escitalopram (LEXAPRO) 10 MG tablet Take 1 tablet (10 mg total) by mouth daily. 12/01/15   Carollee Leitz, NP    Allergies Sulfa antibiotics  Family History  Problem Relation Age of Onset  . Arthritis Mother   . Cancer Mother     Breast Cancer  . Hyperlipidemia Father   . Heart disease Father   . Stroke Father   . Hypertension Father   . Arthritis Maternal Grandmother   . Mental illness Maternal Grandmother   . Cancer Maternal Grandfather     Colon/ Prostate Cancer  . Heart disease Maternal Grandfather   . Stroke Maternal Grandfather   . Diabetes Maternal Grandfather   . Arthritis Paternal Grandmother     Social History Social History  Substance Use Topics  . Smoking status: Never Smoker  . Smokeless tobacco: Never Used  . Alcohol use No    Review of Systems Constitutional: No fever/chills Eyes: Positive for blurred vision at times. ENT: No sore throat. Cardiovascular: Positive for chest pains last night, none now. Respiratory: Denies shortness of breath. Gastrointestinal: No abdominal pain.  No nausea, no vomiting.  No diarrhea.  No constipation. Genitourinary: Negative for dysuria. Musculoskeletal: Negative for back pain. Skin: Negative for rash. Neurological: Positive for headaches with associated dizziness.  10-point ROS otherwise negative.  ____________________________________________   PHYSICAL EXAM:  VITAL SIGNS: ED Triage Vitals  Enc  Vitals Group     BP 04/21/16 1133 (!) 163/95     Pulse Rate 04/21/16 1133 87     Resp 04/21/16 1133 18     Temp 04/21/16 1133 98.8 F (37.1 C)     Temp Source 04/21/16 1133 Oral     SpO2 --      Weight 04/21/16 1134 135 lb (61.2 kg)     Height 04/21/16 1134 5\' 3"  (1.6 m)     Head Circumference --      Peak Flow --      Pain Score 04/21/16 1135 5     Pain Loc --      Pain Edu? --      Excl. in GC? --      Constitutional: Alert and oriented. Well appearing and in no acute distress. Eyes: Conjunctivae are normal. PERRL. EOMI .Funduscopy unremarkable. Head: Atraumatic. Nose: No congestion/rhinnorhea. Mouth/Throat: Mucous membranes are moist.  Oropharynx non-erythematous. Neck: No stridor.  No carotid bruits noted. Cardiovascular: Normal rate, regular rhythm. Grossly normal heart sounds.  Good peripheral circulation. Respiratory: Normal respiratory effort.  No retractions. Lungs CTAB. Musculoskeletal: No lower extremity tenderness nor edema.  No joint effusions. Neurologic:  Normal speech and language. No gross focal neurologic deficits are appreciated. No gait instability. Skin:  Skin is warm, dry and intact. No rash noted. Psychiatric: Mood and affect are normal. Speech and behavior are normal.  ____________________________________________   LABS (all labs ordered are listed, but only abnormal results are displayed)  Labs Reviewed  BASIC METABOLIC PANEL - Abnormal; Notable for the following:       Result Value   Glucose, Bld 118 (*)    Calcium 8.8 (*)    All other components within normal limits  CBC  TROPONIN I   ____________________________________________  EKG   ____________________________________________  RADIOLOGY  Head CT with no acute findings. ____________________________________________   PROCEDURES  Procedure(s) performed: None  Procedures  Critical Care performed: No  ____________________________________________   INITIAL IMPRESSION / ASSESSMENT AND PLAN / ED COURSE  Pertinent labs & imaging results that were available during my care of the patient were reviewed by me and considered in my medical decision making (see chart for details).  Discharge prescription for years had a headache. She is encouraged to follow up with PCP in the treated for her hypertension.   Clinical Course     ____________________________________________   FINAL  CLINICAL IMPRESSION(S) / ED DIAGNOSES  Final diagnoses:  Essential hypertension  Other headache syndrome      NEW MEDICATIONS STARTED DURING THIS VISIT:  Current Discharge Medication List       Note:  This document was prepared using Dragon voice recognition software and may include unintentional dictation errors.   Evangeline DakinCharles M Beers, PA-C 04/21/16 1614    Sharman CheekPhillip Stafford, MD 04/27/16 [phone]

## 2016-04-22 ENCOUNTER — Other Ambulatory Visit: Payer: Self-pay | Admitting: Family

## 2016-04-22 ENCOUNTER — Encounter: Payer: Self-pay | Admitting: Family

## 2016-04-22 ENCOUNTER — Telehealth: Payer: Self-pay | Admitting: Family

## 2016-04-22 DIAGNOSIS — I10 Essential (primary) hypertension: Secondary | ICD-10-CM

## 2016-04-22 MED ORDER — LISINOPRIL 5 MG PO TABS: 5.0000 mg | ORAL_TABLET | Freq: Every day | ORAL | 3 refills | Status: DC

## 2016-04-22 NOTE — Telephone Encounter (Signed)
Left message regarding blood pressure and also sent mychart message,

## 2016-04-25 ENCOUNTER — Ambulatory Visit (INDEPENDENT_AMBULATORY_CARE_PROVIDER_SITE_OTHER): Payer: 59 | Admitting: Family

## 2016-04-25 ENCOUNTER — Encounter: Payer: Self-pay | Admitting: Family

## 2016-04-25 VITALS — BP 142/98 | HR 89 | Temp 98.4°F | Ht 63.0 in | Wt 147.4 lb

## 2016-04-25 DIAGNOSIS — I10 Essential (primary) hypertension: Secondary | ICD-10-CM | POA: Diagnosis not present

## 2016-04-25 MED ORDER — LISINOPRIL 10 MG PO TABS: 10.0000 mg | ORAL_TABLET | Freq: Every day | ORAL | 3 refills | Status: DC

## 2016-04-25 NOTE — Progress Notes (Signed)
Pre visit review using our clinic review tool, if applicable. No additional management support is needed unless otherwise documented below in the visit note. 

## 2016-04-25 NOTE — Assessment & Plan Note (Addendum)
Uncontrolled. Concerning symptoms of headache, left arm pain, jaw pain. Reassured by EKG which did not show acute ischemia. I am also reassured by normal neurologic exam. However Patient has family h/o of HTN and CVA. I instructed patient that if any of these symptoms worsen or she has new symptoms, she must go to ED immediately which both patient and husband understood as I have concern for risk for MI, CVA. I have concern for underlying structural and ischemic disease and have placed a referral to cardiology for further evaluation with possible stress test, echo. Patient will come back in couple of weeks to check BMP and keep BP log.

## 2016-04-25 NOTE — Patient Instructions (Addendum)
Pended BMP- come back in one month to recheck lab work.   Please make CPE appt, pap.   If there is no improvement in your symptoms, or if there is any worsening of symptoms, or if you have any additional concerns, please return for re-evaluation; or, if we are closed, consider going to the Emergency Room for evaluation if symptoms urgent.   Managing Your High Blood Pressure Blood pressure is a measurement of how forceful your blood is pressing against the walls of the arteries. Arteries are muscular tubes within the circulatory system. Blood pressure does not stay the same. Blood pressure rises when you are active, excited, or nervous; and it lowers during sleep and relaxation. If the numbers measuring your blood pressure stay above normal most of the time, you are at risk for health problems. High blood pressure (hypertension) is a long-term (chronic) condition in which blood pressure is elevated. A blood pressure reading is recorded as two numbers, such as 120 over 80 (or 120/80). The first, higher number is called the systolic pressure. It is a measure of the pressure in your arteries as the heart beats. The second, lower number is called the diastolic pressure. It is a measure of the pressure in your arteries as the heart relaxes between beats.  Keeping your blood pressure in a normal range is important to your overall health and prevention of health problems, such as heart disease and stroke. When your blood pressure is uncontrolled, your heart has to work harder than normal. High blood pressure is a very common condition in adults because blood pressure tends to rise with age. Men and women are equally likely to have hypertension but at different times in life. Before age 44, men are more likely to have hypertension. After 44 years of age, women are more likely to have it. Hypertension is especially common in African Americans. This condition often has no signs or symptoms. The cause of the condition  is usually not known. Your caregiver can help you come up with a plan to keep your blood pressure in a normal, healthy range. BLOOD PRESSURE STAGES Blood pressure is classified into four stages: normal, prehypertension, stage 1, and stage 2. Your blood pressure reading will be used to determine what type of treatment, if any, is necessary. Appropriate treatment options are tied to these four stages:  Normal  Systolic pressure (mm Hg): below 120.  Diastolic pressure (mm Hg): below 80. Prehypertension  Systolic pressure (mm Hg): 120 to 139.  Diastolic pressure (mm Hg): 80 to 89. Stage1  Systolic pressure (mm Hg): 140 to 159.  Diastolic pressure (mm Hg): 90 to 99. Stage2  Systolic pressure (mm Hg): 160 or above.  Diastolic pressure (mm Hg): 100 or above. RISKS RELATED TO HIGH BLOOD PRESSURE Managing your blood pressure is an important responsibility. Uncontrolled high blood pressure can lead to:  A heart attack.  A stroke.  A weakened blood vessel (aneurysm).  Heart failure.  Kidney damage.  Eye damage.  Metabolic syndrome.  Memory and concentration problems. HOW TO MANAGE YOUR BLOOD PRESSURE Blood pressure can be managed effectively with lifestyle changes and medicines (if needed). Your caregiver will help you come up with a plan to bring your blood pressure within a normal range. Your plan should include the following: Education  Read all information provided by your caregivers about how to control blood pressure.  Educate yourself on the latest guidelines and treatment recommendations. New research is always being done to further define the  risks and treatments for high blood pressure. Lifestylechanges  Control your weight.  Avoid smoking.  Stay physically active.  Reduce the amount of salt in your diet.  Reduce stress.  Control any chronic conditions, such as high cholesterol or diabetes.  Reduce your alcohol intake. Medicines  Several medicines  (antihypertensive medicines) are available, if needed, to bring blood pressure within a normal range. Communication  Review all the medicines you take with your caregiver because there may be side effects or interactions.  Talk with your caregiver about your diet, exercise habits, and other lifestyle factors that may be contributing to high blood pressure.  See your caregiver regularly. Your caregiver can help you create and adjust your plan for managing high blood pressure. RECOMMENDATIONS FOR TREATMENT AND FOLLOW-UP  The following recommendations are based on current guidelines for managing high blood pressure in nonpregnant adults. Use these recommendations to identify the proper follow-up period or treatment option based on your blood pressure reading. You can discuss these options with your caregiver.  Systolic pressure of 120 to 139 or diastolic pressure of 80 to 89: Follow up with your caregiver as directed.  Systolic pressure of 140 to 160 or diastolic pressure of 90 to 100: Follow up with your caregiver within 2 months.  Systolic pressure above 160 or diastolic pressure above 100: Follow up with your caregiver within 1 month.  Systolic pressure above 180 or diastolic pressure above 110: Consider antihypertensive therapy; follow up with your caregiver within 1 week.  Systolic pressure above 200 or diastolic pressure above 120: Begin antihypertensive therapy; follow up with your caregiver within 1 week.   This information is not intended to replace advice given to you by your health care provider. Make sure you discuss any questions you have with your health care provider.   Document Released: 04/04/2012 Document Reviewed: 04/04/2012 Elsevier Interactive Patient Education [employer]

## 2016-04-25 NOTE — Progress Notes (Signed)
Subjective:    Patient ID: Annette Gonzales, female    DOB: [birth year], 45 y.o.   MRN: [id number]  CC: [name] is a 45 y.o. female who presents today for follow up.   HPI: Patient here for acute visit with chief complaint of BP elevation. I started 5mg  lisinopril 3 days ago.   BP has been ranging 178/108 and 160/107 since starting lisinopril. Describes as labile. No known kidney disease. Knows BP goes up has 'mild' HA and notes left arm hurts. Earlier today had blurry vision, since resolved. She has also had an episode of jaw pain which has resolved.  No crushing chest pain, palpitations, SOB.   Has never been on BP blood pressure medication. Has family h/o HTN.   Per chart review, went to ED 9/28 163/95. Head CT had no acute findings. troponin normal. EKG didn't show acute ischemic changes. Normal BMP.      HISTORY:  Past Medical History:  Diagnosis Date  . Asthma   . Cardiac arrhythmia due to congenital heart disease   . Depression   . Diverticulitis   . Frequent headaches   . GERD (gastroesophageal reflux disease)   . History of kidney stones   . History of stomach ulcers history of mouth ulcers  . Hyperlipidemia   . Hypertension   . Urinary tract bacterial infections    Past Surgical History:  Procedure Laterality Date  . ABDOMINAL HYSTERECTOMY  2005   Uterus only  . APPENDECTOMY  1985  . HERNIA REPAIR  2012  . LEEP    . TONSILLECTOMY    . TONSILLECTOMY AND ADENOIDECTOMY  1989   Family History  Problem Relation Age of Onset  . Arthritis Mother   . Cancer Mother     Breast Cancer  . Hyperlipidemia Father   . Heart disease Father   . Stroke Father   . Hypertension Father   . Arthritis Maternal Grandmother   . Mental illness Maternal Grandmother   . Cancer Maternal Grandfather     Colon/ Prostate Cancer  . Heart disease Maternal Grandfather   . Stroke Maternal Grandfather   . Diabetes Maternal Grandfather   . Arthritis Paternal Grandmother      Allergies: Sulfa antibiotics Current Outpatient Prescriptions on File Prior to Visit  Medication Sig Dispense Refill  . busPIRone (BUSPAR) 7.5 MG tablet Take 1 tablet (7.5 mg total) by mouth 3 (three) times daily. 90 tablet 0  . butalbital-acetaminophen-caffeine (FIORICET, ESGIC) 50-325-40 MG tablet Take 1-2 tablets by mouth every 6 (six) hours as needed for headache. 20 tablet 0  . escitalopram (LEXAPRO) 10 MG tablet Take 1 tablet (10 mg total) by mouth daily. 90 tablet 3   No current facility-administered medications on file prior to visit.     Social History  Substance Use Topics  . Smoking status: Never Smoker  . Smokeless tobacco: Never Used  . Alcohol use No    Review of Systems  Constitutional: Negative for chills and fever.  Eyes: Positive for visual disturbance.  Respiratory: Negative for cough and shortness of breath.   Cardiovascular: Negative for chest pain and palpitations.  Gastrointestinal: Negative for nausea and vomiting.  Neurological: Positive for headaches.      Objective:    BP (!) 142/98   Pulse 89   Temp 98.4 F (36.9 C) (Oral)   Ht 5\' 3"  (1.6 m)   Wt 147 lb 6.4 oz (66.9 kg)   SpO2 98%   BMI 26.11 kg/m  BP Readings from Last 3 Encounters:  04/25/16 (!) 142/98  04/21/16 (!) 158/94  05/18/15 112/80   Wt Readings from Last 3 Encounters:  04/25/16 147 lb 6.4 oz (66.9 kg)  04/21/16 135 lb (61.2 kg)  05/18/15 140 lb 9.6 oz (63.8 kg)    Physical Exam  Constitutional: She appears well-developed and well-nourished.  HENT:  Mouth/Throat: Uvula is midline, oropharynx is clear and moist and mucous membranes are normal.  Eyes: Conjunctivae and EOM are normal. Pupils are equal, round, and reactive to light.  Fundus normal bilaterally.   Cardiovascular: Normal rate, regular rhythm, normal heart sounds and normal pulses.   Pulmonary/Chest: Effort normal and breath sounds normal. She has no wheezes. She has no rhonchi. She has no rales.   Neurological: She is alert. She has normal strength. No cranial nerve deficit or sensory deficit. She displays a negative Romberg sign.  Reflex Scores:      Bicep reflexes are 2+ on the right side and 2+ on the left side.      Patellar reflexes are 2+ on the right side and 2+ on the left side. Grip equal and strong bilateral upper extremities. Gait strong and steady. Able to perform rapid alternating movement without difficulty.   Skin: Skin is warm and dry.  Psychiatric: She has a normal mood and affect. Her speech is normal and behavior is normal. Thought content normal.  Vitals reviewed.      Assessment & Plan:   Problem List Items Addressed This Visit      Cardiovascular and Mediastinum   Benign essential HTN    Uncontrolled. Concerning symptoms of headache, left arm pain, jaw pain. Reassured by EKG which did not show acute ischemia. I am also reassured by normal neurologic exam. However Patient has family h/o of HTN and CVA. I instructed patient that if any of these symptoms worsen or new symptoms, she must go to ED immediately which both patient and husband understood. I have concern for underlying structural and ischemic disease and have placed a referral to cardiology for further evaluation with possible stress test, echo. Patient will come back in couple of weeks to check BMP and keep BP log.       Relevant Medications   lisinopril (PRINIVIL,ZESTRIL) 10 MG tablet    Other Visit Diagnoses    Essential hypertension    -  Primary   Relevant Medications   lisinopril (PRINIVIL,ZESTRIL) 10 MG tablet   Other Relevant Orders   Basic metabolic panel   US Renal Artery Stenosis   EKG 12-Lead (Completed)   Ambulatory referral to Cardiology       I have discontinued Ms. Busler's lisinopril. I am also having her start on lisinopril. Additionally, I am having her maintain her busPIRone, escitalopram, butalbital-acetaminophen-caffeine, clonazePAM, and sertraline.   Meds ordered this  encounter  Medications  . clonazePAM (KLONOPIN) 1 MG tablet  . sertraline (ZOLOFT) 100 MG tablet  . lisinopril (PRINIVIL,ZESTRIL) 10 MG tablet    Sig: Take 1 tablet (10 mg total) by mouth daily.    Dispense:  90 tablet    Refill:  3    Order Specific Question:   Supervising Provider    Answer:   Sherlene ShamsULLO, TERESA L [[id number]]    Return precautions given.   Risks, benefits, and alternatives of the medications and treatment plan prescribed today were discussed, and patient expressed understanding.   Education regarding symptom management and diagnosis given to patient on AVS.  Continue to follow with Oleh Geninarrie M  Doss, NP for routine health maintenance.   Annette Gonzales and I agreed with plan.   Rennie Plowman, FNP

## 2016-04-28 NOTE — Progress Notes (Signed)
Patient BP was 130/99 earlier today.

## 2016-05-03 ENCOUNTER — Ambulatory Visit (INDEPENDENT_AMBULATORY_CARE_PROVIDER_SITE_OTHER): Payer: 59 | Admitting: Internal Medicine

## 2016-05-03 ENCOUNTER — Encounter: Payer: Self-pay | Admitting: Internal Medicine

## 2016-05-03 VITALS — BP 140/90 | HR 93 | Ht 63.0 in | Wt 145.0 lb

## 2016-05-03 DIAGNOSIS — R079 Chest pain, unspecified: Secondary | ICD-10-CM | POA: Diagnosis not present

## 2016-05-03 DIAGNOSIS — Z7689 Persons encountering health services in other specified circumstances: Secondary | ICD-10-CM | POA: Diagnosis not present

## 2016-05-03 DIAGNOSIS — R42 Dizziness and giddiness: Secondary | ICD-10-CM

## 2016-05-03 DIAGNOSIS — I10 Essential (primary) hypertension: Secondary | ICD-10-CM | POA: Diagnosis not present

## 2016-05-03 MED ORDER — CARVEDILOL 3.125 MG PO TABS: 3.1250 mg | ORAL_TABLET | Freq: Two times a day (BID) | ORAL | 6 refills | Status: DC

## 2016-05-03 MED ORDER — ASPIRIN EC 81 MG PO TBEC: 81.0000 mg | DELAYED_RELEASE_TABLET | Freq: Every day | ORAL | 3 refills | Status: DC

## 2016-05-03 NOTE — Progress Notes (Signed)
New Outpatient Visit Date: 05/03/2016  Referring Provider: Allegra Grana, FNP [address] [place], [place] [address]  Chief Complaint: chest pain  HPI:  Annette Gonzales is a 45 y.o. year-old female with history of hypertension, hyperlipidemia, GERD, depression, and asthma who has been referred by [name] for evaluation of chest pain.  The patient reports several weeks of severe ripping and stabbing substernal chest pain with occasional radiation to the left arm and jaw.  The pain can occur at any time (rest and with exertion) and is associated with shortness of breath and palpitations.  The pain typically lasts a seconds to minutes before improving spontaneously.  There are no clear precipitants, though her blood pressure is often elevated during one of these episodes.  The patient also reports that her blood pressure has been quite labile and severely elevated at times.  She associates headaches and dizziness with high blood pressures.  This led to an ED visit on 04/21/16, at which time her blood pressure was noted to be 163/95.  She was given "IV caffeine" which did not improve her headache and made it feel as though her heart was racing.  [name] reports having elevated blood pressures for 1-2 years and was recently placed on lisinopril.  This was titrated up from 5 to 10 mg daily after her ED visit with improved blood pressure control.  She notes that home readings remain at or above 140 systolic.  She is currently awaiting renal artery duplex to exclude renal artery stenosis, as she notes that her maternal grandmother had renovascular hypertension and required a stenting and/or bypass procedure.  [name] reports chronic ankle and foot swelling.  She also has intermittent 2-pillow orthopnea and PND.  She snores frequently and has also been noted to have apneic spell by her husband.  He has never undergone a sleep study.  She does not exercise regularly, as she found that her  heart rate would shoot up rapidly with even modest exertion.  The patient's only prior cardiac workup includes a hart monitor several years ago.  She denies prior echo or stress test.  --------------------------------------------------------------------------------------------------  Cardiovascular History & Procedures: Cardiovascular Problems:  Chest pain  Hypertension  Risk Factors:  Family history and sedentary lifestyle  Cath/PCI:  None  CV Surgery:  None  EP Procedures and Devices:  None  Non-Invasive Evaluation(s):  None  Recent CV Pertinent Labs: Lab Results  Component Value Date   K 4.1 04/21/2016   BUN 11 04/21/2016   CREATININE 0.80 04/21/2016    --------------------------------------------------------------------------------------------------  Past Medical History:  Diagnosis Date  . Asthma   . Depression   . Diverticulitis   . Frequent headaches   . GERD (gastroesophageal reflux disease)   . History of kidney stones   . History of stomach ulcers history of mouth ulcers  . Hyperlipidemia   . Hypertension   . Urinary tract bacterial infections     Past Surgical History:  Procedure Laterality Date  . ABDOMINAL HYSTERECTOMY  2005   Uterus only  . APPENDECTOMY  1985  . HERNIA REPAIR  2012  . LEEP    . TONSILLECTOMY    . TONSILLECTOMY AND ADENOIDECTOMY  1989    Outpatient Encounter Prescriptions as of 05/03/2016  Medication Sig  . escitalopram (LEXAPRO) 10 MG tablet Take 1 tablet (10 mg total) by mouth daily.  [name] lisinopril (PRINIVIL,ZESTRIL) 10 MG tablet Take 1 tablet (10 mg total) by mouth daily.  . [DISCONTINUED] butalbital-acetaminophen-caffeine (  FIORICET, ESGIC) 50-325-40 MG tablet Take 1-2 tablets by mouth every 6 (six) hours as needed for headache.  . [DISCONTINUED] clonazePAM (KLONOPIN) 1 MG tablet   . [DISCONTINUED] sertraline (ZOLOFT) 100 MG tablet   . busPIRone (BUSPAR) 7.5 MG tablet Take 1 tablet (7.5 mg total) by mouth 3  (three) times daily. (Patient not taking: Reported on 05/03/2016)   No facility-administered encounter medications on file as of 05/03/2016.     Allergies: Sulfa antibiotics  Social History   Social History  . Marital status: Married    Spouse name: N/A  . Number of children: N/A  . Years of education: N/A   Occupational History  . Not on file.   Social History Main Topics  . Smoking status: Never Smoker  . Smokeless tobacco: Never Used  . Alcohol use No  . Drug use: No  . Sexual activity: Not on file   Other Topics Concern  . Not on file   Social History Narrative   Married   Employed as Pt Care Coordinator   Some College    2 children    Family History  Problem Relation Age of Onset  . Arthritis Mother   . Cancer Mother     Breast Cancer  . Heart disease Mother     Mitral valve disease  . Hyperlipidemia Father   . Heart disease Father   . Stroke Father   . Hypertension Father   . Arthritis Maternal Grandmother   . Mental illness Maternal Grandmother   . Cancer Maternal Grandfather     Colon/ Prostate Cancer  . Heart disease Maternal Grandfather   . Stroke Maternal Grandfather   . Diabetes Maternal Grandfather   . Arthritis Paternal Grandmother   . Hypertension Paternal Grandmother     renal artery stenosis  . Heart attack Paternal Grandfather   . Hypertension Sister   . Heart disease Sister     tachycardia s/p ablation    Review of Systems: Review of Systems  Constitutional: Positive for malaise/fatigue (tired).  Eyes: Positive for blurred vision (with headaches).  Respiratory: Positive for shortness of breath.   Cardiovascular: Positive for chest pain, palpitations, orthopnea, leg swelling and PND. Negative for claudication.  Gastrointestinal: Positive for nausea.  Genitourinary: Negative for hematuria (history of renal stones).  Musculoskeletal: Negative.   Skin: Negative.   Neurological: Positive for dizziness and headaches.    Psychiatric/Behavioral: The patient is nervous/anxious.    --------------------------------------------------------------------------------------------------  Physical Exam: BP 140/90 (BP Location: Right Arm, Patient Position: Sitting, Cuff Size: Normal)   Pulse 93   Ht 5\' 3"  (1.6 m)   Wt 145 lb (65.8 kg)   SpO2 93%   BMI 25.69 kg/m   General:  Well-developed, well-nourished woman, seated comfortably in the exam room.  She is accompanied by her husband. HEENT: No conjunctival pallor or scleral icterus.  Moist mucous membranes.  OP clear. Neck: Supple without lymphadenopathy, thyromegaly, JVD, or HJR.  No carotid bruit. Lungs: Normal work of breathing.  Clear to auscultation bilaterally without wheezes or crackles. Heart: Regular rate and rhythm without murmurs, rubs, or gallops.  Non-displaced PMI. Abd: Bowel sounds present.  Soft, NT/ND without hepatosplenomegaly Ext: No lower extremity edema.  Radial, PT, and DP pulses are 2+ bilaterally Skin: warm and dry without rash Neuro: CNIII-XII intact.  Strength and fine-touch sensation intact in upper and lower extremities bilaterally. Psych: Normal mood and affect.  EKG:  Normal sinus rhythm with left posterior fascicular block.  RSR' is evident on  today's EKG in V1.  Otherwise, there has been no significant change since 04/25/16.  Lab Results  Component Value Date   WBC 5.3 04/21/2016   HGB 15.1 04/21/2016   HCT 42.3 04/21/2016   MCV 84.2 04/21/2016   PLT 282 04/21/2016    Lab Results  Component Value Date   NA 138 04/21/2016   K 4.1 04/21/2016   CL 107 04/21/2016   CO2 26 04/21/2016   BUN 11 04/21/2016   CREATININE 0.80 04/21/2016   GLUCOSE 118 (H) 04/21/2016   ALT 24 05/16/2015    --------------------------------------------------------------------------------------------------  ASSESSMENT AND PLAN: 1. Chest pain The quality and timing of the patient's chest pain is atypical, given that it is brief, sharp, and not  consistently associated with exertion.  Family history (albeit not clearly premature) is her primary risk factor of atherosclerotic cardiovascular disease.  I suspect that her symptoms may in part be due to anxiety.  We have agreed to pursue further evaluation with exercise treadmill stress test.  We will also start aspirin 81 mg daily, with the expectation of discontinuing this if her stress test is negative.  We will also start carvedilol 3.125 mg BID.  2. Dizziness Patient describes elements of both lightheadedness and vertigo.  I doubt that this reflects cerebral hypoperfusion, as the patient states that the dizziness most often occurs when her blood pressure is severely elevated.  We will obtain a transthoracic echocardiogram to evaluate for structural heart abnormalities.  Based on the findings and her symptoms, we will consider placing an event monitor in the future.  3. Essential hypertension Blood pressure is borderline elevated today and seems to be suboptimally controlled at home based on reported readings by the patient.  She is currently on lisinopril 10 mg daily.  Given her occasional palpitations with accompanying chest pain, we have agreed to start carvedilol 3.125 mg BID.  It is reasonable to proceed with renal artery duplex, as previously ordered.  Given her age and the fact that she has has elevated blood pressures for at least 1-2 years, I believe that pheochromocytoma is less likely.  Given her hypertension and constellation of symptoms (including snoring, PND, and possible apnea witnessed by the patient's husband), it would be reasonable to consider a sleep study.  I will defer this to the patient's PCP.  Follow-up: Return to clinic when testing has been completed, in about 4-6 weeks.  Yvonne Kendallhristopher Trevaris Pennella, MD 05/03/2016 3:42 PM

## 2016-05-03 NOTE — Patient Instructions (Addendum)
Medication Instructions:  Your physician has recommended you make the following change in your medication:  1. START Aspirin 81 mg Once daily 2. START Carvedilol 3.125 mg Twice a day   Testing/Procedures: Your physician has requested that you have an echocardiogram. Echocardiography is a painless test that uses sound waves to create images of your heart. It provides your doctor with information about the size and shape of your heart and how well your heart's chambers and valves are working. This procedure takes approximately one hour. There are no restrictions for this procedure.  Your physician has requested that you have a stress echocardiogram. For further information please visit [email or web address]. Please follow instruction sheet as given.   Do not drink or eat foods with caffeine for 24 hours before the test. (Chocolate, coffee, tea, or energy drinks)  If you use an inhaler, bring it with you to the test.  Do not smoke for 4 hours before the test.  Wear comfortable shoes and clothing.  Follow-Up: Your physician recommends that you schedule a follow-up appointment with Dr. Okey DupreEnd after testing.   It was a pleasure seeing you today here in the office. Please do not hesitate to give us a call back if you have any further questions. [phone]  [place] CellarPamela A. RN, BSN     Echocardiogram An echocardiogram, or echocardiography, uses sound waves (ultrasound) to produce an image of your heart. The echocardiogram is simple, painless, obtained within a short period of time, and offers valuable information to your health care provider. The images from an echocardiogram can provide information such as:  Evidence of coronary artery disease (CAD).  Heart size.  Heart muscle function.  Heart valve function.  Aneurysm detection.  Evidence of a past heart attack.  Fluid buildup around the heart.  Heart muscle thickening.  Assess heart valve function. [facility] KNOW  ABOUT:  Any allergies you have.  All medicines you are taking, including vitamins, herbs, eye drops, creams, and over-the-counter medicines.  Previous problems you or members of your family have had with the use of anesthetics.  Any blood disorders you have.  Previous surgeries you have had.  Medical conditions you have.  Possibility of pregnancy, if this applies. BEFORE THE PROCEDURE  No special preparation is needed. Eat and drink normally.  PROCEDURE   In order to produce an image of your heart, gel will be applied to your chest and a wand-like tool (transducer) will be moved over your chest. The gel will help transmit the sound waves from the transducer. The sound waves will harmlessly bounce off your heart to allow the heart images to be captured in real-time motion. These images will then be recorded.  You may need an IV to receive a medicine that improves the quality of the pictures. AFTER THE PROCEDURE You may return to your normal schedule including diet, activities, and medicines, unless your health care provider tells you otherwise.   This information is not intended to replace advice given to you by your health care provider. Make sure you discuss any questions you have with your health care provider.   Document Released: 07/08/2000 Document Revised: 08/01/2014 Document Reviewed: 03/18/2013 Elsevier Interactive Patient Education 2016 [employer]   Exercise Stress Echocardiogram An exercise stress echocardiogram is a heart (cardiac) test used to check the function of your heart. This test may also be called an exercise stress echocardiography or stress echo. This stress test will check how well your heart muscle and valves are  working and determine if your heart muscle is getting enough blood. You will exercise on a treadmill to naturally increase or stress the functioning of your heart.  An echocardiogram uses sound waves (ultrasound) to produce an image of your heart.  If your heart does not work normally, it may indicate coronary artery disease with poor coronary blood supply. The coronary arteries are the arteries that bring blood and oxygen to your heart. LET [facility] CARE PROVIDER KNOW ABOUT:  Any allergies you have.  All medicines you are taking, including vitamins, herbs, eye drops, creams, and over-the-counter medicines.  Previous problems you or members of your family have had with the use of anesthetics.  Any blood disorders you have.  Previous surgeries you have had.  Medical conditions you have.  Possibility of pregnancy, if this applies. RISKS AND COMPLICATIONS Generally, this is a safe procedure. However, as with any procedure, complications can occur. Possible complications can include:  You develop pain or pressure in the following areas:  Chest.  Jaw or neck.  Between your shoulder blades.  Radiating down your left arm.  Dizziness or lightheadedness.  Shortness of breath.  Increased or irregular heartbeat.  Nausea or vomiting.  Heart attack (rare). BEFORE THE PROCEDURE  Avoid all forms of caffeine for 24 hours before your test or as directed by your health care provider. This includes coffee, tea (even decaffeinated tea), caffeinated sodas, chocolate, cocoa, and certain pain medicines.  Follow your health care provider's instructions regarding eating and drinking before the test.  Take your medicines as directed at regular times with water unless instructed otherwise. Exceptions may include:  If you have diabetes, ask how you are to take your insulin or pills. It is common to adjust insulin dosing the morning of the test.  If you are taking beta-blocker medicines, it is important to talk to your health care provider about these medicines well before the date of your test. Taking beta-blocker medicines may interfere with the test. In some cases, these medicines need to be changed or stopped 24 hours or more before  the test.  If you wear a nitroglycerin patch, it may need to be removed prior to the test. Ask your health care provider if the patch should be removed before the test.  If you use an inhaler for any breathing condition, bring it with you to the test.  If you are an outpatient, bring a snack so you can eat right after the stress phase of the test.  Do not smoke for 4 hours prior to the test or as directed by your health care provider.  Wear loose-fitting clothes and comfortable shoes for the test. This test involves walking on a treadmill. PROCEDURE   Multiple electrodes will be put on your chest. If needed, small areas of your chest may be shaved to get better contact with the electrodes. Once the electrodes are attached to your body, multiple wires will be attached to the electrodes, and your heart rate will be monitored.  You will have an echocardiogram done at rest.  To produce this image of your heart, gel is applied to your chest, and a wand-like tool (transducer) is moved over the chest. The transducer sends the sound waves through the chest to create the moving images of your heart.  You may need an IV to receive a medication that improves the quality of the pictures.  You will then walk on a treadmill. The treadmill will be started at a slow pace.  The treadmill speed and incline will gradually be increased to raise your heart rate.  At the peak of exercise, the treadmill will be stopped. You will lie down immediately on a bed so that a second echocardiogram can be done to visualize your heart's motion with exercise.  The test usually takes 30-60 minutes to complete. AFTER THE PROCEDURE  Your heart rate and blood pressure will be monitored after the test.  You may return to your normal schedule, including diet, activities, and medicines, unless your health care provider tells you otherwise.   This information is not intended to replace advice given to you by your health care  provider. Make sure you discuss any questions you have with your health care provider.   Document Released: 07/15/2004 Document Revised: 07/16/2013 Document Reviewed: 03/18/2013 Elsevier Interactive Patient Education [employer]

## 2016-05-04 ENCOUNTER — Other Ambulatory Visit: Payer: Self-pay | Admitting: *Deleted

## 2016-05-04 DIAGNOSIS — I10 Essential (primary) hypertension: Secondary | ICD-10-CM

## 2016-05-05 ENCOUNTER — Encounter: Payer: Self-pay | Admitting: Family

## 2016-05-05 ENCOUNTER — Other Ambulatory Visit: Payer: Self-pay | Admitting: Family

## 2016-05-05 DIAGNOSIS — R0683 Snoring: Secondary | ICD-10-CM

## 2016-05-06 ENCOUNTER — Other Ambulatory Visit (INDEPENDENT_AMBULATORY_CARE_PROVIDER_SITE_OTHER): Payer: 59

## 2016-05-06 DIAGNOSIS — I10 Essential (primary) hypertension: Secondary | ICD-10-CM | POA: Diagnosis not present

## 2016-05-07 LAB — LIPID PANEL
CHOLESTEROL TOTAL: 185 mg/dL (ref 100–199)
Chol/HDL Ratio: 4.9 ratio units — ABNORMAL HIGH (ref 0.0–4.4)
HDL: 38 mg/dL — ABNORMAL LOW (ref >39)
LDL Calculated: 112 mg/dL — ABNORMAL HIGH (ref 0–99)
TRIGLYCERIDES: 174 mg/dL — AB (ref 0–149)
VLDL Cholesterol Cal: 35 mg/dL (ref 5–40)

## 2016-05-13 ENCOUNTER — Encounter (INDEPENDENT_AMBULATORY_CARE_PROVIDER_SITE_OTHER): Payer: 59

## 2016-05-13 DIAGNOSIS — I10 Essential (primary) hypertension: Secondary | ICD-10-CM

## 2016-05-19 ENCOUNTER — Encounter (INDEPENDENT_AMBULATORY_CARE_PROVIDER_SITE_OTHER): Payer: Self-pay

## 2016-05-27 ENCOUNTER — Ambulatory Visit (INDEPENDENT_AMBULATORY_CARE_PROVIDER_SITE_OTHER): Payer: 59

## 2016-05-27 ENCOUNTER — Other Ambulatory Visit: Payer: Self-pay

## 2016-05-27 DIAGNOSIS — R42 Dizziness and giddiness: Secondary | ICD-10-CM

## 2016-05-27 DIAGNOSIS — R079 Chest pain, unspecified: Secondary | ICD-10-CM

## 2016-05-27 LAB — ECHOCARDIOGRAM STRESS TEST
CHL CUP RESTING HR STRESS: 96 {beats}/min
CSEPED: 7 min
CSEPEDS: 39 s
CSEPHR: 94 %
Estimated workload: 9.5 METS
MPHR: 176 {beats}/min
Peak HR: 166 {beats}/min

## 2016-06-01 ENCOUNTER — Telehealth: Payer: 59 | Admitting: Family

## 2016-06-01 DIAGNOSIS — J069 Acute upper respiratory infection, unspecified: Secondary | ICD-10-CM

## 2016-06-01 MED ORDER — BENZONATATE 100 MG PO CAPS: 100.0000 mg | ORAL_CAPSULE | Freq: Two times a day (BID) | ORAL | 0 refills | Status: DC | PRN

## 2016-06-01 MED ORDER — ALBUTEROL SULFATE HFA 108 (90 BASE) MCG/ACT IN AERS: 2.0000 | INHALATION_SPRAY | Freq: Four times a day (QID) | RESPIRATORY_TRACT | 0 refills | Status: DC | PRN

## 2016-06-01 MED ORDER — PREDNISONE 10 MG (21) PO TBPK: ORAL_TABLET | ORAL | 0 refills | Status: DC

## 2016-06-01 NOTE — Addendum Note (Signed)
Addended by: Jannifer RodneyHAWKS, Brandii Lakey A on: 06/01/2016 11:56 AM   Modules accepted: Orders

## 2016-06-01 NOTE — Progress Notes (Signed)
We are sorry that you are not feeling well.  Here is how we plan to help!  Based on what you have shared with me it looks like you have upper respiratory tract inflammation that has resulted in a significant cough.  Inflammation and infection in the upper respiratory tract is commonly called bronchitis and has four common causes:  Allergies, Viral Infections, Acid Reflux and Bacterial Infections.  Allergies, viruses and acid reflux are treated by controlling symptoms or eliminating the cause. An example might be a cough caused by taking certain blood pressure medications. You stop the cough by changing the medication. Another example might be a cough caused by acid reflux. Controlling the reflux helps control the cough.  Based on your presentation I believe you most likely have A cough due to a virus.  This is called viral bronchitis and is best treated by rest, plenty of fluids and control of the cough.  You may use Ibuprofen or Tylenol as directed to help your symptoms.     In addition you may use A non-prescription cough medication called Robitussin DAC. Take 2 teaspoons every 8 hours or Delsym: take 2 teaspoons every 12 hours., A non-prescription cough medication called Mucinex DM: take 2 tablets every 12 hours. and A prescription cough medication called Tessalon Perles 100mg. You may take 1-2 capsules every 8 hours as needed for your cough.  Sterapred 10 mg dosepak  USE OF BRONCHODILATOR ("RESCUE") INHALERS: There is a risk from using your bronchodilator too frequently.  The risk is that over-reliance on a medication which only relaxes the muscles surrounding the breathing tubes can reduce the effectiveness of medications prescribed to reduce swelling and congestion of the tubes themselves.  Although you feel brief relief from the bronchodilator inhaler, your asthma may actually be worsening with the tubes becoming more swollen and filled with mucus.  This can delay other crucial treatments, such as  oral steroid medications. If you need to use a bronchodilator inhaler daily, several times per day, you should discuss this with your provider.  There are probably better treatments that could be used to keep your asthma under control.     HOME CARE . Only take medications as instructed by your medical team. . Complete the entire course of an antibiotic. . Drink plenty of fluids and get plenty of rest. . Avoid close contacts especially the very young and the elderly . Cover your mouth if you cough or cough into your sleeve. . Always remember to wash your hands . A steam or ultrasonic humidifier can help congestion.   GET HELP RIGHT AWAY IF: . You develop worsening fever. . You become short of breath . You cough up blood. . Your symptoms persist after you have completed your treatment plan MAKE SURE YOU   Understand these instructions.  Will watch your condition.  Will get help right away if you are not doing well or get worse.  Your e-visit answers were reviewed by a board certified advanced clinical practitioner to complete your personal care plan.  Depending on the condition, your plan could have included both over the counter or prescription medications. If there is a problem please reply  once you have received a response from your provider. Your safety is important to us.  If you have drug allergies check your prescription carefully.    You can use MyChart to ask questions about today's visit, request a non-urgent call back, or ask for a work or school excuse for 24 hours   related to this e-Visit. If it has been greater than 24 hours you will need to follow up with your provider, or enter a new e-Visit to address those concerns. You will get an e-mail in the next two days asking about your experience.  I hope that your e-visit has been valuable and will speed your recovery. Thank you for using e-visits.   

## 2016-06-03 MED ORDER — FLUTICASONE PROPIONATE 50 MCG/ACT NA SUSP: 2.0000 | Freq: Every day | NASAL | 0 refills | Status: DC

## 2016-06-03 MED ORDER — AMOXICILLIN-POT CLAVULANATE 875-125 MG PO TABS: 1.0000 | ORAL_TABLET | Freq: Two times a day (BID) | ORAL | 0 refills | Status: AC

## 2016-06-03 NOTE — Addendum Note (Signed)
Addended by: Beau FannyWITHROW, Rayden Scheper C on: 06/03/2016 02:34 PM   Modules accepted: Orders

## 2016-06-08 ENCOUNTER — Ambulatory Visit: Payer: 59 | Admitting: Internal Medicine

## 2016-06-20 DIAGNOSIS — M25519 Pain in unspecified shoulder: Secondary | ICD-10-CM | POA: Insufficient documentation

## 2016-07-07 ENCOUNTER — Ambulatory Visit: Payer: 59

## 2016-07-07 ENCOUNTER — Encounter: Payer: Self-pay | Admitting: Family

## 2016-07-07 ENCOUNTER — Ambulatory Visit (INDEPENDENT_AMBULATORY_CARE_PROVIDER_SITE_OTHER): Payer: 59 | Admitting: Family

## 2016-07-07 VITALS — BP 132/88 | HR 101 | Temp 98.0°F | Ht 63.0 in | Wt 150.4 lb

## 2016-07-07 DIAGNOSIS — N393 Stress incontinence (female) (male): Secondary | ICD-10-CM

## 2016-07-07 LAB — POCT URINALYSIS DIPSTICK
Bilirubin, UA: NEGATIVE
Blood, UA: NEGATIVE
Glucose, UA: NEGATIVE
KETONES UA: NEGATIVE
Nitrite, UA: NEGATIVE
PH UA: 5
PROTEIN UA: NEGATIVE
SPEC GRAV UA: 1.02
UROBILINOGEN UA: 0.2

## 2016-07-07 NOTE — Progress Notes (Signed)
Subjective:    Patient ID: Annette Gonzales, female    DOB: 09/18/1970, 45 y.o.   MRN: [id number]  CC: Annette Gonzales is a 45 y.o. female who presents today for an acute visit.    HPI: CC: urinary incontinence for past week. Always had 'little bladder leakage'. Worse over the past week. Endorses pelvic 'ache' which improved with heat. Ache is dull and intermittent. No fever, abdominal pain. No urgency, dysuria, nocturia.No excessive caffeine or alcohol  S/p hysterectomy 2 vaginal deliveries Mild dryness with intercourse         HISTORY:  Past Medical History:  Diagnosis Date  . Asthma   . Depression   . Diverticulitis   . Frequent headaches   . GERD (gastroesophageal reflux disease)   . History of kidney stones   . History of stomach ulcers history of mouth ulcers  . Hyperlipidemia   . Hypertension   . Urinary tract bacterial infections    Past Surgical History:  Procedure Laterality Date  . ABDOMINAL HYSTERECTOMY  2005   Uterus only  . APPENDECTOMY  1985  . HERNIA REPAIR  2012  . LEEP    . TONSILLECTOMY    . TONSILLECTOMY AND ADENOIDECTOMY  1989   Family History  Problem Relation Age of Onset  . Arthritis Mother   . Cancer Mother     Breast Cancer  . Heart disease Mother     Mitral valve disease  . Hyperlipidemia Father   . Heart disease Father   . Stroke Father   . Hypertension Father   . Arthritis Maternal Grandmother   . Mental illness Maternal Grandmother   . Cancer Maternal Grandfather     Colon/ Prostate Cancer  . Heart disease Maternal Grandfather   . Stroke Maternal Grandfather   . Diabetes Maternal Grandfather   . Arthritis Paternal Grandmother   . Hypertension Paternal Grandmother     renal artery stenosis  . Heart attack Paternal Grandfather   . Hypertension Sister   . Heart disease Sister     tachycardia s/p ablation    Allergies: Sulfa antibiotics Current Outpatient Prescriptions on File Prior to Visit  Medication Sig Dispense  Refill  . albuterol (PROVENTIL HFA;VENTOLIN HFA) 108 (90 Base) MCG/ACT inhaler Inhale 2 puffs into the lungs every 6 (six) hours as needed for wheezing or shortness of breath. 1 Inhaler 0  . aspirin EC 81 MG tablet Take 1 tablet (81 mg total) by mouth daily. 90 tablet 3  . benzonatate (TESSALON) 100 MG capsule Take 1 capsule (100 mg total) by mouth 2 (two) times daily as needed for cough. 20 capsule 0  . busPIRone (BUSPAR) 7.5 MG tablet Take 1 tablet (7.5 mg total) by mouth 3 (three) times daily. 90 tablet 0  . carvedilol (COREG) 3.125 MG tablet Take 1 tablet (3.125 mg total) by mouth 2 (two) times daily. 60 tablet 6  . escitalopram (LEXAPRO) 10 MG tablet Take 1 tablet (10 mg total) by mouth daily. 90 tablet 3  . fluticasone (FLONASE) 50 MCG/ACT nasal spray Place 2 sprays into both nostrils daily. 16 g 0  . lisinopril (PRINIVIL,ZESTRIL) 10 MG tablet Take 1 tablet (10 mg total) by mouth daily. 90 tablet 3  . predniSONE (STERAPRED UNI-PAK 21 TAB) 10 MG (21) TBPK tablet As directed x 6 days 21 tablet 0   No current facility-administered medications on file prior to visit.     Social History  Substance Use Topics  . Smoking status: Never Smoker  .  Smokeless tobacco: Never Used  . Alcohol use No    Review of Systems  Constitutional: Negative for chills and fever.  Respiratory: Negative for cough.   Cardiovascular: Negative for chest pain and palpitations.  Gastrointestinal: Negative for abdominal distention, abdominal pain, nausea and vomiting.  Genitourinary: Negative for dyspareunia, dysuria, frequency, hematuria, urgency and vaginal bleeding.      Objective:    BP 132/88   Pulse (!) 101   Temp 98 F (36.7 C) (Oral)   Ht 5\' 3"  (1.6 m)   Wt 150 lb 6.4 oz (68.2 kg)   SpO2 98%   BMI 26.64 kg/m    Physical Exam  Constitutional: She appears well-developed and well-nourished.  Eyes: Conjunctivae are normal.  Cardiovascular: Normal rate, regular rhythm, normal heart sounds and  normal pulses.   Pulmonary/Chest: Effort normal and breath sounds normal. She has no wheezes. She has no rhonchi. She has no rales.  Genitourinary:  Genitourinary Comments: No cervix. Unable to appreciate prolapse, ovaries. No tenderness noted during bimanual exam.   Neurological: She is alert.  Skin: Skin is warm and dry.  Psychiatric: She has a normal mood and affect. Her speech is normal and behavior is normal. Thought content normal.  Vitals reviewed.      Assessment & Plan:   Problem List Items Addressed This Visit      Other   Stress incontinence of urine - Primary    UA is negative for nitrites, blood. Trace leukocytes. Patient is not having dysuria. Jointly agreed to wait on urine culture as does not appear patient has a UTI. Symptoms were consistent with stress incontinence. No symptoms of urgency to indicate overactive bladder. Pelvic exam unable to appreciate prolapse although still considering this is part of the differential. Alternatively, considering vaginal atrophy exacerbating symptom. Patient's mother has a history of hormone replacement therapy and subsequent breast cancer and declined any trial of vaginal premarin. Patient also politely declined transvaginal ultrasound is pelvic "ache "is intermittent and 'dull'. We will continue to monitor this symptom. and Patient and I jointly agreed due to impact on quality of life and worsening of urinary incontinence, consult with GYN for further evaluation for prolapse is necessary.      Relevant Orders   POCT Urinalysis Dipstick (Completed)   Urine culture   Ambulatory referral to Gynecology       I am having Annette Gonzales maintain her busPIRone, escitalopram, lisinopril, aspirin EC, carvedilol, predniSONE, benzonatate, albuterol, and fluticasone.   No orders of the defined types were placed in this encounter.   Return precautions given.   Risks, benefits, and alternatives of the medications and treatment plan prescribed  today were discussed, and patient expressed understanding.   Education regarding symptom management and diagnosis given to patient on AVS.  Continue to follow with Annette PlowmanMargaret Lanecia Sliva, Annette Gonzales for routine health maintenance.   Annette LimerickMelinda B Cuen and I agreed with plan.   Annette PlowmanMargaret Lamarr Feenstra, Annette Gonzales

## 2016-07-07 NOTE — Assessment & Plan Note (Addendum)
UA is negative for nitrites, blood. Trace leukocytes. Patient is not having dysuria. Jointly agreed to wait on urine culture as does not appear patient has a UTI. Symptoms were consistent with stress incontinence. No symptoms of urgency to indicate overactive bladder. Pelvic exam unable to appreciate prolapse although still considering this is part of the differential. Alternatively, considering vaginal atrophy exacerbating symptom. Patient's mother has a history of hormone replacement therapy and subsequent breast cancer and declined any trial of vaginal premarin. Patient also politely declined transvaginal ultrasound is pelvic "ache "is intermittent and 'dull'. We will continue to monitor this symptom. and Patient and I jointly agreed due to impact on quality of life and worsening of urinary incontinence, consult with GYN for further evaluation for prolapse is necessary.

## 2016-07-07 NOTE — Progress Notes (Signed)
Pre visit review using our clinic review tool, if applicable. No additional management support is needed unless otherwise documented below in the visit note. 

## 2016-07-07 NOTE — Patient Instructions (Signed)
Symptoms consistent with stress incontinence   Referral to GYN  Urine culture  Urinary Incontinence Urinary incontinence is the involuntary loss of urine from your bladder. CAUSES  There are many causes of urinary incontinence. They include:  Medicines.  Infections.  Prostatic enlargement, leading to overflow of urine from your bladder.  Surgery.  Neurological diseases.  Emotional factors. SIGNS AND SYMPTOMS Urinary Incontinence can be divided into four types: 1. Urge incontinence. Urge incontinence is the involuntary loss of urine before you have the opportunity to go to the bathroom. There is a sudden urge to void but not enough time to reach a bathroom. 2. Stress incontinence. Stress incontinence is the sudden loss of urine with any activity that forces urine to pass. It is commonly caused by anatomical changes to the pelvis and sphincter areas of your body. 3. Overflow incontinence. Overflow incontinence is the loss of urine from an obstructed opening to your bladder. This results in a backup of urine and a resultant buildup of pressure within the bladder. When the pressure within the bladder exceeds the closing pressure of the sphincter, the urine overflows, which causes incontinence, similar to water overflowing a dam. 4. Total incontinence. Total incontinence is the loss of urine as a result of the inability to store urine within your bladder. DIAGNOSIS  Evaluating the cause of incontinence may require:  A thorough and complete medical and obstetric history.  A complete physical exam.  Laboratory tests such as a urine culture and sensitivities. When additional tests are indicated, they can include:  An ultrasound exam.  Kidney and bladder X-rays.  Cystoscopy. This is an exam of the bladder using a narrow scope.  Urodynamic testing to test the nerve function to the bladder and sphincter areas. TREATMENT  Treatment for urinary incontinence depends on the  cause:  For urge incontinence caused by a bacterial infection, antibiotics will be prescribed. If the urge incontinence is related to medicines you take, your health care provider may have you change the medicine.  For stress incontinence, surgery to re-establish anatomical support to the bladder or sphincter, or both, will often correct the condition.  For overflow incontinence caused by an enlarged prostate, an operation to open the channel through the enlarged prostate will allow the flow of urine out of the bladder. In women with fibroids, a hysterectomy may be recommended.  For total incontinence, surgery on your urinary sphincter may help. An artificial urinary sphincter (an inflatable cuff placed around the urethra) may be required. In women who have developed a hole-like passage between their bladder and vagina (vesicovaginal fistula), surgery to close the fistula often is required. HOME CARE INSTRUCTIONS  Normal daily hygiene and the use of pads or adult diapers that are changed regularly will help prevent odors and skin damage.  Avoid caffeine. It can overstimulate your bladder.  Use the bathroom regularly. Try about every 2-3 hours to go to the bathroom, even if you do not feel the need to do so. Take time to empty your bladder completely. After urinating, wait a minute. Then try to urinate again.  For causes involving nerve dysfunction, keep a log of the medicines you take and a journal of the times you go to the bathroom. SEEK MEDICAL CARE IF:  You experience worsening of pain instead of improvement in pain after your procedure.  Your incontinence becomes worse instead of better. SEE IMMEDIATE MEDICAL CARE IF:  You experience fever or shaking chills.  You are unable to pass your urine.  You have redness spreading into your groin or down into your thighs. MAKE SURE YOU:  ?Understand these instructions.   Will watch your condition.  Will get help right away if you are  not doing well or get worse. This information is not intended to replace advice given to you by your health care provider. Make sure you discuss any questions you have with your health care provider. Document Released: 08/18/2004 Document Revised: 08/01/2014 Document Reviewed: 12/18/2012 Elsevier Interactive Patient Education  2017 [employer]

## 2016-07-08 LAB — URINE CULTURE

## 2016-07-11 ENCOUNTER — Encounter: Payer: Self-pay | Admitting: Family

## 2016-08-31 ENCOUNTER — Encounter: Payer: Self-pay | Admitting: Obstetrics and Gynecology

## 2016-09-09 ENCOUNTER — Encounter: Payer: Self-pay | Admitting: Family

## 2016-09-12 ENCOUNTER — Ambulatory Visit: Payer: 59 | Admitting: Family

## 2016-09-12 ENCOUNTER — Other Ambulatory Visit: Payer: 59

## 2016-09-12 DIAGNOSIS — Z0289 Encounter for other administrative examinations: Secondary | ICD-10-CM

## 2016-09-14 ENCOUNTER — Other Ambulatory Visit: Payer: Self-pay | Admitting: Family

## 2016-09-21 ENCOUNTER — Encounter: Payer: Self-pay | Admitting: Obstetrics and Gynecology

## 2016-12-02 ENCOUNTER — Other Ambulatory Visit: Payer: Self-pay | Admitting: Nurse Practitioner

## 2016-12-03 ENCOUNTER — Encounter: Payer: Self-pay | Admitting: Family

## 2017-03-09 DIAGNOSIS — M7542 Impingement syndrome of left shoulder: Secondary | ICD-10-CM | POA: Diagnosis not present

## 2017-03-22 ENCOUNTER — Encounter: Payer: Self-pay | Admitting: Family

## 2017-03-22 MED ORDER — ESCITALOPRAM OXALATE 10 MG PO TABS: 10.0000 mg | ORAL_TABLET | Freq: Every day | ORAL | 1 refills | Status: DC

## 2017-03-23 DIAGNOSIS — M7542 Impingement syndrome of left shoulder: Secondary | ICD-10-CM | POA: Diagnosis not present

## 2017-03-29 ENCOUNTER — Ambulatory Visit: Payer: 59 | Admitting: Family

## 2017-04-03 ENCOUNTER — Ambulatory Visit (INDEPENDENT_AMBULATORY_CARE_PROVIDER_SITE_OTHER): Payer: BLUE CROSS/BLUE SHIELD | Admitting: Family

## 2017-04-03 ENCOUNTER — Encounter: Payer: Self-pay | Admitting: Family

## 2017-04-03 VITALS — BP 130/80 | HR 82 | Temp 98.7°F | Ht 63.0 in | Wt 149.8 lb

## 2017-04-03 DIAGNOSIS — N393 Stress incontinence (female) (male): Secondary | ICD-10-CM

## 2017-04-03 DIAGNOSIS — F418 Other specified anxiety disorders: Secondary | ICD-10-CM | POA: Diagnosis not present

## 2017-04-03 DIAGNOSIS — I10 Essential (primary) hypertension: Secondary | ICD-10-CM | POA: Diagnosis not present

## 2017-04-03 DIAGNOSIS — Z1211 Encounter for screening for malignant neoplasm of colon: Secondary | ICD-10-CM | POA: Insufficient documentation

## 2017-04-03 MED ORDER — OXYBUTYNIN CHLORIDE 5 MG PO TABS: 2.5000 mg | ORAL_TABLET | Freq: Two times a day (BID) | ORAL | 2 refills | Status: DC

## 2017-04-03 MED ORDER — LISINOPRIL 10 MG PO TABS: 10.0000 mg | ORAL_TABLET | Freq: Every day | ORAL | 3 refills | Status: DC

## 2017-04-03 MED ORDER — ESCITALOPRAM OXALATE 10 MG PO TABS: 10.0000 mg | ORAL_TABLET | Freq: Every day | ORAL | 2 refills | Status: DC

## 2017-04-03 MED ORDER — BUSPIRONE HCL 7.5 MG PO TABS: 7.5000 mg | ORAL_TABLET | Freq: Three times a day (TID) | ORAL | 1 refills | Status: DC

## 2017-04-03 NOTE — Progress Notes (Signed)
Subjective:    Patient ID: Annette Gonzales, female    DOB: 05/31/1971, 46 y.o.   MRN: [id number]  CC: Annette Gonzales is a 46 y.o. female who presents today for follow up.   HPI: HTN- compliant with medication. BP at home 120/80. Denies exertional chest pain or pressure, numbness or tingling radiating to left arm or jaw, palpitations, dizziness, frequent headaches, changes in vision, or shortness of breath.    Depression- well controlled on regimen. No thoughts of hurting herself or anyone else.   OAB- urinary incontinence with laugh, sneeze. Endorses urgency. No dysuria, hematuria. Pelvic pain has resolved.   Due for mammogram. Unsure of family history colon cancer, polyps.       HISTORY:  Past Medical History:  Diagnosis Date  . Asthma   . Depression   . Diverticulitis   . Frequent headaches   . GERD (gastroesophageal reflux disease)   . History of kidney stones   . History of stomach ulcers history of mouth ulcers  . Hyperlipidemia   . Hypertension   . Urinary tract bacterial infections    Past Surgical History:  Procedure Laterality Date  . ABDOMINAL HYSTERECTOMY  2005   Uterus only  . APPENDECTOMY  1985  . HERNIA REPAIR  2012  . LEEP    . TONSILLECTOMY    . TONSILLECTOMY AND ADENOIDECTOMY  1989   Family History  Problem Relation Age of Onset  . Arthritis Mother   . Cancer Mother        Breast Cancer  . Heart disease Mother        Mitral valve disease  . Hyperlipidemia Father   . Heart disease Father   . Stroke Father   . Hypertension Father   . Arthritis Maternal Grandmother   . Mental illness Maternal Grandmother   . Cancer Maternal Grandfather        Colon/ Prostate Cancer  . Heart disease Maternal Grandfather   . Stroke Maternal Grandfather   . Diabetes Maternal Grandfather   . Arthritis Paternal Grandmother   . Hypertension Paternal Grandmother        renal artery stenosis  . Heart attack Paternal Grandfather   . Hypertension Sister   .  Heart disease Sister        tachycardia s/p ablation    Allergies: Sulfa antibiotics Current Outpatient Prescriptions on File Prior to Visit  Medication Sig Dispense Refill  . albuterol (PROVENTIL HFA;VENTOLIN HFA) 108 (90 Base) MCG/ACT inhaler Inhale 2 puffs into the lungs every 6 (six) hours as needed for wheezing or shortness of breath. 1 Inhaler 0  . fluticasone (FLONASE) 50 MCG/ACT nasal spray Place 2 sprays into both nostrils daily. 16 g 0   No current facility-administered medications on file prior to visit.     Social History  Substance Use Topics  . Smoking status: Never Smoker  . Smokeless tobacco: Never Used  . Alcohol use No    Review of Systems  Constitutional: Negative for chills and fever.  Eyes: Negative for visual disturbance.  Respiratory: Negative for cough.   Cardiovascular: Negative for chest pain and palpitations.  Gastrointestinal: Negative for nausea and vomiting.  Genitourinary: Positive for urgency. Negative for difficulty urinating, dysuria and frequency.  Neurological: Negative for headaches.  Psychiatric/Behavioral: Negative for suicidal ideas.      Objective:    BP 130/80   Pulse 82   Temp 98.7 F (37.1 C) (Oral)   Ht 5\' 3"  (1.6 m)   Wt  149 lb 12.8 oz (67.9 kg)   SpO2 98%   BMI 26.54 kg/m  BP Readings from Last 3 Encounters:  04/03/17 130/80  07/07/16 132/88  05/03/16 140/90   Wt Readings from Last 3 Encounters:  04/03/17 149 lb 12.8 oz (67.9 kg)  07/07/16 150 lb 6.4 oz (68.2 kg)  05/03/16 145 lb (65.8 kg)    Physical Exam  Constitutional: She appears well-developed and well-nourished.  Eyes: Conjunctivae are normal.  Cardiovascular: Normal rate, regular rhythm, normal heart sounds and normal pulses.   Pulmonary/Chest: Effort normal and breath sounds normal. She has no wheezes. She has no rhonchi. She has no rales.  Neurological: She is alert.  Skin: Skin is warm and dry.  Psychiatric: She has a normal mood and affect. Her  speech is normal and behavior is normal. Thought content normal.  Vitals reviewed.      Assessment & Plan:   Problem List Items Addressed This Visit      Cardiovascular and Mediastinum   Benign essential HTN    Controlled. Continue current regimen      Relevant Medications   lisinopril (PRINIVIL,ZESTRIL) 10 MG tablet     Other   Depression with anxiety    Stable. Continue current regimen.       Relevant Medications   escitalopram (LEXAPRO) 10 MG tablet   busPIRone (BUSPAR) 7.5 MG tablet   Stress incontinence of urine - Primary    Unchanged. Never went to  Urology. Declines referral today. Would like to trial oxybutnin. Will let me know how she does. Pending  Urine studies.       Relevant Medications   oxybutynin (DITROPAN) 5 MG tablet   Other Relevant Orders   Urinalysis, Routine w reflex microscopic   CULTURE, URINE COMPREHENSIVE   MM SCREENING BREAST TOMO BILATERAL   Screen for colon cancer    Unsure of family h/o. Will let me know.        Other Visit Diagnoses    Essential hypertension       Relevant Medications   lisinopril (PRINIVIL,ZESTRIL) 10 MG tablet       I have discontinued Ms. Tramell's aspirin EC, carvedilol, predniSONE, and benzonatate. I am also having her start on oxybutynin. Additionally, I am having her maintain her albuterol, fluticasone, lisinopril, escitalopram, and busPIRone.   Meds ordered this encounter  Medications  . lisinopril (PRINIVIL,ZESTRIL) 10 MG tablet    Sig: Take 1 tablet (10 mg total) by mouth daily.    Dispense:  90 tablet    Refill:  3  . escitalopram (LEXAPRO) 10 MG tablet    Sig: Take 1 tablet (10 mg total) by mouth daily.    Dispense:  90 tablet    Refill:  2  . busPIRone (BUSPAR) 7.5 MG tablet    Sig: Take 1 tablet (7.5 mg total) by mouth 3 (three) times daily.    Dispense:  270 tablet    Refill:  1  . oxybutynin (DITROPAN) 5 MG tablet    Sig: Take 0.5 tablets (2.5 mg total) by mouth 2 (two) times daily.     Dispense:  60 tablet    Refill:  2    Order Specific Question:   Supervising Provider    Answer:   Sherlene Shams [[id number]]    Return precautions given.   Risks, benefits, and alternatives of the medications and treatment plan prescribed today were discussed, and patient expressed understanding.   Education regarding symptom management and diagnosis given to patient  on AVS.  Continue to follow with Allegra Grana, FNP for routine health maintenance.   Annette Limerick and I agreed with plan.   Rennie Plowman, FNP

## 2017-04-03 NOTE — Assessment & Plan Note (Signed)
Stable  Continue current regimen  

## 2017-04-03 NOTE — Assessment & Plan Note (Signed)
Unchanged. Never went to  Urology. Declines referral today. Would like to trial oxybutnin. Will let me know how she does. Pending  Urine studies.

## 2017-04-03 NOTE — Addendum Note (Signed)
Addended by: Warden FillersWRIGHT, Donyae Kilner S on: 04/03/2017 03:55 PM   Modules accepted: Orders

## 2017-04-03 NOTE — Addendum Note (Signed)
Addended by: Warden FillersWRIGHT, Sedra Morfin S on: 04/03/2017 03:56 PM   Modules accepted: Orders

## 2017-04-03 NOTE — Assessment & Plan Note (Signed)
Controlled. Continue current regimen. 

## 2017-04-03 NOTE — Addendum Note (Signed)
Addended by: WRIGHT, LATOYA S on: 04/03/2017 04:05 PM   Modules accepted: Orders  

## 2017-04-03 NOTE — Addendum Note (Signed)
Addended by: Elise BenneBOOTH, Macy Polio T on: 04/03/2017 04:02 PM   Modules accepted: Orders

## 2017-04-03 NOTE — Patient Instructions (Addendum)
Please let me know about family history colon cancer/polyps. If any positivem call office and I will order colonoscopy.  Trial of oxybutnin. Let me know if you would like to see urology  Come back for physical, fasting labs  Lab, urine studies.   We placed a referral for mammogram this year. I asked that you call one the below locations and schedule this when it is convenient for you.   As discussed, I would like you to ask for 3D mammogram over the traditional 2D mammogram as new evidence suggest 3D is superior.   Please note that NOT all insurance companies cover 3D and you may have to pay a higher copay. You may call your insurance company to further clarify your benefits.   Options for Mammogram.    [facility]  [address]  [place], [place]  [phone]  * [facility]*   [facility] [address] [place], [place] [phone] * Note if you ask for 3D mammogram at this location, you must request [place], [place] location*

## 2017-04-03 NOTE — Addendum Note (Signed)
Addended by: Warden FillersWRIGHT, Kyndra Condron S on: 04/03/2017 04:05 PM   Modules accepted: Orders

## 2017-04-03 NOTE — Addendum Note (Signed)
Addended by: Warden FillersWRIGHT, Obi Scrima S on: 04/03/2017 03:53 PM   Modules accepted: Orders

## 2017-04-03 NOTE — Assessment & Plan Note (Signed)
Unsure of family h/o. Will let me know.

## 2017-04-04 LAB — URINE CULTURE
MICRO NUMBER: 80993507
SPECIMEN QUALITY:: ADEQUATE

## 2017-04-04 LAB — BASIC METABOLIC PANEL
BUN: 11 mg/dL (ref 6–23)
CHLORIDE: 104 meq/L (ref 96–112)
CO2: 24 mEq/L (ref 19–32)
CREATININE: 0.72 mg/dL (ref 0.40–1.20)
Calcium: 9.3 mg/dL (ref 8.4–10.5)
GFR: 92.82 mL/min (ref >60.00)
Glucose, Bld: 84 mg/dL (ref 70–99)
Potassium: 3.9 mEq/L (ref 3.5–5.1)
Sodium: 136 mEq/L (ref 135–145)

## 2017-04-04 LAB — URINALYSIS, ROUTINE W REFLEX MICROSCOPIC
BACTERIA UA: NONE SEEN /HPF
BILIRUBIN URINE: NEGATIVE
Glucose, UA: NEGATIVE
HGB URINE DIPSTICK: NEGATIVE
Hyaline Cast: NONE SEEN /LPF
KETONES UR: NEGATIVE
Nitrite: NEGATIVE
PH: 6.5 (ref 5.0–8.0)
Protein, ur: NEGATIVE
Specific Gravity, Urine: 1.01 (ref 1.001–1.03)

## 2017-04-05 DIAGNOSIS — M7542 Impingement syndrome of left shoulder: Secondary | ICD-10-CM | POA: Diagnosis not present

## 2017-04-14 ENCOUNTER — Encounter: Payer: Self-pay | Admitting: Family

## 2017-05-04 ENCOUNTER — Ambulatory Visit: Payer: BLUE CROSS/BLUE SHIELD

## 2017-05-24 ENCOUNTER — Encounter: Payer: Self-pay | Admitting: Physical Therapy

## 2017-05-24 ENCOUNTER — Ambulatory Visit: Payer: BLUE CROSS/BLUE SHIELD | Attending: Specialist | Admitting: Physical Therapy

## 2017-05-24 DIAGNOSIS — M25512 Pain in left shoulder: Secondary | ICD-10-CM | POA: Diagnosis not present

## 2017-05-24 DIAGNOSIS — G8929 Other chronic pain: Secondary | ICD-10-CM | POA: Diagnosis not present

## 2017-05-24 DIAGNOSIS — M62838 Other muscle spasm: Secondary | ICD-10-CM | POA: Insufficient documentation

## 2017-05-24 DIAGNOSIS — M6281 Muscle weakness (generalized): Secondary | ICD-10-CM | POA: Insufficient documentation

## 2017-05-24 NOTE — Therapy (Signed)
[facility] PHYSICAL AND SPORTS MEDICINE [address] [place], [place], [address] Phone: [phone]   Fax:  [phone]  Physical Therapy Evaluation  Patient Details  Name: Annette Gonzales MRN: [id number] Date of Birth: 07/05/1971 Referring Provider: Valma Cavaollins, Andrew  Encounter Date: 05/24/2017      PT End of Session - 05/24/17 1026    Visit Number 1   Number of Visits 12   Date for PT Re-Evaluation 07/05/17   PT Start Time 0855   PT Stop Time 1000   PT Time Calculation (min) 65 min   Activity Tolerance Patient tolerated treatment well   Behavior During Therapy [facility] for tasks assessed/performed      Past Medical History:  Diagnosis Date  . Asthma   . Depression   . Diverticulitis   . Frequent headaches   . GERD (gastroesophageal reflux disease)   . History of kidney stones   . History of stomach ulcers history of mouth ulcers  . Hyperlipidemia   . Hypertension   . Urinary tract bacterial infections     Past Surgical History:  Procedure Laterality Date  . ABDOMINAL HYSTERECTOMY  2005   Uterus only  . APPENDECTOMY  1985  . HERNIA REPAIR  2012  . LEEP    . TONSILLECTOMY    . TONSILLECTOMY AND ADENOIDECTOMY  1989    There were no vitals filed for this visit.       Subjective Assessment - 05/24/17 0907    Subjective no pain with rest, up to a 2/10 with movement and worst pain 6-7/10 with certain movements.    Pertinent History 02/2016 began left shoulder pain; remodeling kitchen at that time. initially was having pain with reaching and raising arm out to side and pain and limitations have gotten progressively worse. had injection 03/2017 with good results.    Limitations Lifting;House hold activities;Other (comment)  personal care   Diagnostic tests MRI     Patient Stated Goals improve ROM and be pain free to return to prior level of function   Currently in Pain? Yes   Pain Score 2    Pain Location Shoulder   Pain  Orientation Right   Pain Descriptors / Indicators Aching   Pain Type Chronic pain   Pain Onset More than a month ago   Pain Frequency Intermittent   Aggravating Factors  lifting, raising left arm   Pain Relieving Factors rest, heat, medication   Effect of Pain on Daily Activities limits activities at work, home, personal care            [facility] Assessment - 05/24/17 0917      Assessment   Medical Diagnosis Impingement syndrome of left shoulder   Referring Provider Benny Lennertollins, Andrew R MD   Onset Date/Surgical Date 02/23/16   Hand Dominance Right   Prior Therapy none     Balance Screen   Has the patient fallen in the past 6 months No     Prior Function   Level of Independence Independent   Vocation Full time employment   Vocation Requirements computer   Leisure family, spend time with pets     Cognition   Overall Cognitive Status Within Functional Limits for tasks assessed     Observation/Other Assessments   Quick DASH  48% (0 = no self perceived disability)     Posture/Postural Control   Posture Comments guarded with forward rounded left shoulder     ROM / Strength   AROM /  PROM / Strength AROM;PROM;Strength     AROM   Overall AROM Comments left shoulder limited forward elevation 120, abduction 90, decreased functional IR to L4, ER to ear; pain limits further motion     PROM   Overall PROM Comments left shoulder forward elevation, abduction, ER and IR WFL with pain at end ranges     Strength   Overall Strength Comments decreased left shoulder forward elevation 4-/5, abduction 4-/5 with pain      Palpation   Palpation comment point tender anterior aspect left shoulder      Special Tests    Special Tests Rotator Cuff Impingement   Rotator Cuff Impingment tests Neer impingement test;Hawkins- Kyung Rudd test;Empty Can test     Neer Impingement test    Findings Positive   Side Left     Hawkins-Kennedy test   Findings Positive   Side Left     Empty Can test    Findings Positive   Side Left            Objective measurements completed on examination: See above findings.   Treatment:  Modalities: Electrical stimulation: 15 min.; Russian stim. 10/10 cycle applied (2) electrodes to left shoulder periscapular and lower trapezius muscle with patient seated with UE supported on pillow; pt. Performing scapular retraction with each cycle; goal muscle re education  Ultrasound x 10 min. Applied  to left shoulder anterior and lateral aspect 1.1 w/cm2 50% patient seated with left UE supported on pillow goal: pain, inflammation Ice pack applied to left shoulder in conjunction with electrical stimulation for pain control; no adverse reactions noted  Patient response to treatment: decreased pain and able to raise left UE above shoulder level >120 degrees         PT Education - 05/24/17 1024    Education provided Yes   Education Details POC; posture awareness, positioning to avoid impingement, scapular retraction, AAROM supine lying: modalities to assist with pain control, ice or heat with precautions for barrier between skin and modalitiy, ice massage with written instructions   Person(s) Educated Patient   Methods Explanation;Demonstration;Verbal cues;Handout   Comprehension Verbal cues required;Returned demonstration;Verbalized understanding             PT Long Term Goals - 05/24/17 1039      PT LONG TERM GOAL #1   Title  Patient will demonstrate improved function and decreased pain in left shoulder as indicated by QuickDash score of 30% or better   Baseline quickDash 48%   Status New   Target Date 06/14/17     PT LONG TERM GOAL #2   Title  Patient will demonstrate improved function and decreased pain in shoulder as indicated by QuickDash score of <18%   Baseline 48%   Status New   Target Date 07/05/17     PT LONG TERM GOAL #3   Title of  Patient will be independent with home program for pain control, exercises for flexibility  and strength to allow transition to self management once discharged from physical therapy   Status New   Target Date 07/05/17                Plan - 05/24/17 1027    Clinical Impression Statement Patient is a 46 year old right hand dominant female who presents with chronic left shoulder pain with limited AROM, strength and pain that limit function with daily activities. Her QuickDash score of 48% indicates moderate to severe self perceived limitations using left UE.  She has limited knowledge of appropriate pain control, posture awareness, exercises and progression and will benefit from physical therapy intervention.   History and Personal Factors relevant to plan of care: 02/2016 began left shoulder pain; remodeling kitchen at that time. initially was having pain with reaching and raising arm out to side and pain and limitations have gotten progressively worse. had injection 03/2017 with good results.    Clinical Presentation Evolving   Clinical Presentation due to: pain and tingling radiating into left UE intermittently   Clinical Decision Making Low   Rehab Potential Good   Clinical Impairments Affecting Rehab Potential (+)age, motivated, prior level of function   PT Frequency 2x / week   PT Duration 6 weeks   PT Treatment/Interventions Iontophoresis 4mg /ml Dexamethasone;Electrical Stimulation;Cryotherapy;Moist Heat;Ultrasound;Patient/family education;Neuromuscular re-education;Therapeutic exercise;Manual techniques;Dry needling   PT Next Visit Plan pain control, manual techniques, progress exercises for scapular stabilization   PT Home Exercise Plan scapular retraction, AAROM left shoulder supine, ice massage, precautions for impingment with proper positioning lying, sitting   Consulted and Agree with Plan of Care Patient      Patient will benefit from skilled therapeutic intervention in order to improve the following deficits and impairments:  Decreased strength, Pain, Impaired  perceived functional ability, Decreased activity tolerance, Decreased knowledge of precautions, Increased muscle spasms, Impaired UE functional use, Decreased range of motion  Visit Diagnosis: Chronic left shoulder pain - Plan: PT plan of care cert/re-cert  Other muscle spasm - Plan: PT plan of care cert/re-cert  Muscle weakness (generalized) - Plan: PT plan of care cert/re-cert     Problem List Patient Active Problem List   Diagnosis Date Noted  . Screen for colon cancer 04/03/2017  . Stress incontinence of urine 07/07/2016  . History of asthma 04/24/2015  . Depression with anxiety 04/24/2015  . Right knee pain 04/24/2015  . Diverticulosis of colon without hemorrhage 04/24/2015  . Frequent headaches 04/24/2015  . GERD (gastroesophageal reflux disease) 04/24/2015  . History of gastric ulcer 04/24/2015  . Benign essential HTN 04/24/2015  . HLD (hyperlipidemia) 04/24/2015  . History of nephrolithiasis 04/24/2015  . Overweight 04/24/2015  . Encounter to establish care 04/24/2015    Beacher May PT 05/24/2017, 10:49 AM  [place] [facility] PH[facility] AND SPORTS MEDICINE [address] [place], [address] Phone: [phone]   Fax:  [phone]  Name: Annette Gonzales MRN: [id number] Date of Birth: February 06, 1971

## 2017-05-26 DIAGNOSIS — M7502 Adhesive capsulitis of left shoulder: Secondary | ICD-10-CM | POA: Diagnosis not present

## 2017-05-26 DIAGNOSIS — M7542 Impingement syndrome of left shoulder: Secondary | ICD-10-CM | POA: Diagnosis not present

## 2017-05-29 ENCOUNTER — Ambulatory Visit: Payer: BLUE CROSS/BLUE SHIELD | Attending: Specialist | Admitting: Physical Therapy

## 2017-05-29 ENCOUNTER — Encounter: Payer: Self-pay | Admitting: Physical Therapy

## 2017-05-29 DIAGNOSIS — M62838 Other muscle spasm: Secondary | ICD-10-CM | POA: Diagnosis not present

## 2017-05-29 DIAGNOSIS — M6281 Muscle weakness (generalized): Secondary | ICD-10-CM | POA: Insufficient documentation

## 2017-05-29 DIAGNOSIS — G8929 Other chronic pain: Secondary | ICD-10-CM | POA: Insufficient documentation

## 2017-05-29 DIAGNOSIS — M25512 Pain in left shoulder: Secondary | ICD-10-CM | POA: Insufficient documentation

## 2017-05-30 NOTE — Therapy (Signed)
[facility] [address] [place], [place], [address] Phone: [phone]   Fax:  [phone]  Physical Therapy Treatment  Patient Details  Name: Annette LimerickMelinda B Papadakis MRN: [id number] Date of Birth: 01/01/1971 Referring Provider: Benny Lennertollins, Andrew R MD   Encounter Date: 05/29/2017  PT End of Session - 05/29/17 1107    Visit Number  2    Number of Visits  12    Date for PT Re-Evaluation  07/05/17    PT Start Time  1038    PT Stop Time  1107    PT Time Calculation (min)  29 min    Activity Tolerance  Patient tolerated treatment well    Behavior During Therapy  [facility] for tasks assessed/performed       Past Medical History:  Diagnosis Date  . Asthma   . Depression   . Diverticulitis   . Frequent headaches   . GERD (gastroesophageal reflux disease)   . History of kidney stones   . History of stomach ulcers history of mouth ulcers  . Hyperlipidemia   . Hypertension   . Urinary tract bacterial infections     Past Surgical History:  Procedure Laterality Date  . ABDOMINAL HYSTERECTOMY  2005   Uterus only  . APPENDECTOMY  1985  . HERNIA REPAIR  2012  . LEEP    . TONSILLECTOMY    . TONSILLECTOMY AND ADENOIDECTOMY  1989    There were no vitals filed for this visit.  Subjective Assessment - 05/29/17 1040    Subjective  Patient arriving late due to mis reading MyChart appointment date/time; 25 min. late. She reports she is doing better and has an appointment to see MD for another injection and needs to have therapy scheduled within ~24 hours of receiving injection.     Pertinent History  02/2016 began left shoulder pain; remodeling kitchen at that time. initially was having pain with reaching and raising arm out to side and pain and limitations have gotten progressively worse. had injection 03/2017 with good results.     Limitations  Lifting;House hold activities;Other (comment) personal care   personal care   Diagnostic  tests  MRI      Patient Stated Goals  improve ROM and be pain free to return to prior level of function    Currently in Pain?  Yes    Pain Score  2     Pain Location  Shoulder    Pain Orientation  Left    Pain Descriptors / Indicators  Aching    Pain Type  Chronic pain    Pain Onset  More than a month ago    Pain Frequency  Intermittent       Objective: Pre treatment: FF left shoulder AROM 130;  post treatment: 145 degreess     Treatment:  Modalities: Electrical stimulation: 20 min.; Russian stim. 10/10 cycle applied (2) electrodes to left shoulder periscapular and lower trapezius muscle with patient seated with UE supported on pillow; pt. Performing scapular retraction with each cycle; goal muscle re education  Ultrasound x 10 min. Applied  to left shoulder anterior and lateral aspect 3MHz 1.1 w/cm2 50% patient seated with left UE supported on pillow goal: pain, inflammation; performed US in conjunction with electrical stimulation for pain control; no adverse reactions noted  Patient response to treatment: Patient with decreased soreness/pain in left shoulder and able to raise above shoulder by 10 more degrees following session.     PT  Education - 05/29/17 1110    Education provided  Yes    Education Details  re assessed home exercises and posture     Person(s) Educated  Patient    Methods  Explanation    Comprehension  Verbalized understanding          PT Long Term Goals - 05/24/17 1039      PT LONG TERM GOAL #1   Title   Patient will demonstrate improved function and decreased pain in left shoulder as indicated by QuickDash score of 30% or better    Baseline  quickDash 48%    Status  New    Target Date  06/14/17      PT LONG TERM GOAL #2   Title   Patient will demonstrate improved function and decreased pain in shoulder as indicated by QuickDash score of <18%    Baseline  48%    Status  New    Target Date  07/05/17      PT LONG TERM GOAL #3   Title  of   Patient will be independent with home program for pain control, exercises for flexibility and strength to allow transition to self management once discharged from physical therapy    Status  New    Target Date  07/05/17            Plan - 05/29/17 1107    Clinical Impression Statement Patient demonstrates improving pain and functional use of left UE with current treatment. Limited time for session due to late arrival therefore focused of muscle re education and pain control with good results noted. .     Rehab Potential  Good    Clinical Impairments Affecting Rehab Potential  (+)age, motivated, prior level of function    PT Frequency  2x / week    PT Duration  6 weeks    PT Treatment/Interventions  Iontophoresis 4mg /ml Dexamethasone;Electrical Stimulation;Cryotherapy;Moist Heat;Ultrasound;Patient/family education;Neuromuscular re-education;Therapeutic exercise;Manual techniques;Dry needling    PT Next Visit Plan  pain control, manual techniques, progress exercises for scapular stabilization    PT Home Exercise Plan  scapular retraction, AAROM left shoulder supine, ice massage, precautions for impingment with proper positioning lying, sitting       Patient will benefit from skilled therapeutic intervention in order to improve the following deficits and impairments:  Decreased strength, Pain, Impaired perceived functional ability, Decreased activity tolerance, Decreased knowledge of precautions, Increased muscle spasms, Impaired UE functional use, Decreased range of motion  Visit Diagnosis: Chronic left shoulder pain  Other muscle spasm  Muscle weakness (generalized)     Problem List Patient Active Problem List   Diagnosis Date Noted  . Screen for colon cancer 04/03/2017  . Stress incontinence of urine 07/07/2016  . History of asthma 04/24/2015  . Depression with anxiety 04/24/2015  . Right knee pain 04/24/2015  . Diverticulosis of colon without hemorrhage 04/24/2015  .  Frequent headaches 04/24/2015  . GERD (gastroesophageal reflux disease) 04/24/2015  . History of gastric ulcer 04/24/2015  . Benign essential HTN 04/24/2015  . HLD (hyperlipidemia) 04/24/2015  . History of nephrolithiasis 04/24/2015  . Overweight 04/24/2015  . Encounter to establish care 04/24/2015    Beacher MayBrooks, Marie PT 05/30/2017, 10:09 AM  [place] [facility] [address] [place], [place], [address] Phone: [phone]   Fax:  [phone]  Name: Annette LimerickMelinda B Bazzi MRN: [id number] Date of Birth: 07/24/1971

## 2017-06-01 ENCOUNTER — Ambulatory Visit: Payer: BLUE CROSS/BLUE SHIELD | Admitting: Physical Therapy

## 2017-06-01 ENCOUNTER — Encounter: Payer: Self-pay | Admitting: Physical Therapy

## 2017-06-01 DIAGNOSIS — M62838 Other muscle spasm: Secondary | ICD-10-CM | POA: Diagnosis not present

## 2017-06-01 DIAGNOSIS — M6281 Muscle weakness (generalized): Secondary | ICD-10-CM | POA: Diagnosis not present

## 2017-06-01 DIAGNOSIS — M25512 Pain in left shoulder: Principal | ICD-10-CM

## 2017-06-01 DIAGNOSIS — G8929 Other chronic pain: Secondary | ICD-10-CM

## 2017-06-01 NOTE — Therapy (Signed)
[facility] [address], [place], [address] Phone: [phone]   Fax:  [phone]  Physical Therapy Treatment  Patient Details  Name: Annette Gonzales MRN: [id number] Date of Birth: [birth year] Referring Provider: Benny Lennert MD   Encounter Date: 06/01/2017  PT End of Session - 06/01/17 1118    Visit Number  3    Number of Visits  12    Date for PT Re-Evaluation  07/05/17    PT Start Time  1033    PT Stop Time  1114    PT Time Calculation (min)  41 min    Activity Tolerance  Patient tolerated treatment well    Behavior During Therapy  [facility] for tasks assessed/performed       Past Medical History:  Diagnosis Date  . Asthma   . Depression   . Diverticulitis   . Frequent headaches   . GERD (gastroesophageal reflux disease)   . History of kidney stones   . History of stomach ulcers history of mouth ulcers  . Hyperlipidemia   . Hypertension   . Urinary tract bacterial infections     Past Surgical History:  Procedure Laterality Date  . ABDOMINAL HYSTERECTOMY  2005   Uterus only  . APPENDECTOMY  1985  . HERNIA REPAIR  2012  . LEEP    . TONSILLECTOMY    . TONSILLECTOMY AND ADENOIDECTOMY  1989    There were no vitals filed for this visit.  Subjective Assessment - 06/01/17 1038    Subjective  Patient reports she is doing much better with left shoulder pain and is now able to raise arm above shoulder level with less stiffness and pain.    Pertinent History  02/2016 began left shoulder pain; remodeling kitchen at that time. initially was having pain with reaching and raising arm out to side and pain and limitations have gotten progressively worse. had injection 03/2017 with good results.     Limitations  Lifting;House hold activities;Other (comment) personal care    Diagnostic tests  MRI      Patient Stated Goals  improve ROM and be pain free to return to prior level of function    Currently in  Pain?  Yes    Pain Score  2     Pain Location  Shoulder    Pain Orientation  Left    Pain Descriptors / Indicators  Aching    Pain Type  Chronic pain    Pain Onset  More than a month ago    Pain Frequency  Intermittent       Objective: Pre treatment: FF left shoulder AROM 140;  post treatment: 145 degrees  Palpation: + spasms left upper trapezius and along medial border of scapula   Treatment: Modalities: Electrical stimulation:15 min.;Russian stim. 10/10 cycle applied (2) electrodes toleft shoulder periscapular and lower trapezius musclewith patient seatedwithUE supported on pillow; pt. Performingscapular retractionwith each cycle; goal muscle re education  Ultrasound x 10 min. Applied to left shoulder anterior and lateral aspect 1.1 w/cm2 50% patient seated with left UE supported on pillow goal: pain, inflammation; performed US in conjunction with electrical stimulation for pain control; no adverse reactions noted  Manual therapy: 5 min. Goal; soft tissue elasticity, spasms STM performed to left shoulder girdle with patient right side lying, superficial techniques, scapular mobilization with rotation upward and down and back performed x 10 reps, left upper trapezius stretch 3 x 10-20 seconds performed  Therapeutic  exercises: patient performed with demonstration, instruction, VC and tactile cues of therapist: goal: improve functional use left UE, QuickDash, ROM and strength Supine lying:  Rhythmic stabilization at 90 degrees and neutral rotations with UE in scapular plane: 2 sets 10 reps each Side lying:  Scapular control exercises elevation/depression and protraction/retraction x 10 reps each Standing at wall:  Scapular control with resistive band for pizza carry ER of shoulders Scapular retraction with red resistive band at door x 10 reps following demonstration and with VC  Patient response to treatment: patient demonstrated improved technique with exercises  with repeated demonstration and moderate VC for correct alignment.  Patient with decreased spasms by 30% following STM. Improved motor control and scapular retraction with electrical stimulation. Improved AROM by 5 degrees forward elevation at end of session.    PT Education - 06/01/17 1315    Education provided  Yes    Education Details  exercise instruction HEP; standing shoulder ER pizza carry, scapular retraction with resistive band    Person(s) Educated  Patient    Methods  Explanation;Demonstration;Verbal cues    Comprehension  Verbal cues required;Returned demonstration;Verbalized understanding          PT Long Term Goals - 05/24/17 1039      PT LONG TERM GOAL #1   Title   Patient will demonstrate improved function and decreased pain in left shoulder as indicated by QuickDash score of 30% or better    Baseline  quickDash 48%    Status  New    Target Date  06/14/17      PT LONG TERM GOAL #2   Title   Patient will demonstrate improved function and decreased pain in shoulder as indicated by QuickDash score of <18%    Baseline  48%    Status  New    Target Date  07/05/17      PT LONG TERM GOAL #3   Title  of  Patient will be independent with home program for pain control, exercises for flexibility and strength to allow transition to self management once discharged from physical therapy    Status  New    Target Date  07/05/17            Plan - 06/01/17 1318    Clinical Impression Statement  Patient is progressing well with goals with decreasing pain, improving ROM above shoulder level and improving functional use of left UE. She continues with pain and weakness and will benefit from continued physical therapy intervention to achieve goals.   Rehab Potential  Good    Clinical Impairments Affecting Rehab Potential  (+)age, motivated, prior level of function    PT Frequency  2x / week    PT Duration  6 weeks    PT Treatment/Interventions  Iontophoresis 4mg /ml  Dexamethasone;Electrical Stimulation;Cryotherapy;Moist Heat;Ultrasound;Patient/family education;Neuromuscular re-education;Therapeutic exercise;Manual techniques;Dry needling    PT Next Visit Plan  pain control, manual techniques, progress exercises for scapular stabilization    PT Home Exercise Plan  scapular retraction, AAROM left shoulder supine, ice massage, precautions for impingment with proper positioning lying, sitting       Patient will benefit from skilled therapeutic intervention in order to improve the following deficits and impairments:  Decreased strength, Pain, Impaired perceived functional ability, Decreased activity tolerance, Decreased knowledge of precautions, Increased muscle spasms, Impaired UE functional use, Decreased range of motion  Visit Diagnosis: Chronic left shoulder pain  Other muscle spasm  Muscle weakness (generalized)     Problem List Patient Active Problem  List   Diagnosis Date Noted  . Screen for colon cancer 04/03/2017  . Stress incontinence of urine 07/07/2016  . History of asthma 04/24/2015  . Depression with anxiety 04/24/2015  . Right knee pain 04/24/2015  . Diverticulosis of colon without hemorrhage 04/24/2015  . Frequent headaches 04/24/2015  . GERD (gastroesophageal reflux disease) 04/24/2015  . History of gastric ulcer 04/24/2015  . Benign essential HTN 04/24/2015  . HLD (hyperlipidemia) 04/24/2015  . History of nephrolithiasis 04/24/2015  . Overweight 04/24/2015  . Encounter to establish care 04/24/2015    Beacher MayBrooks, [place] PT 06/01/2017, 11:48 PM  [facility] [address] [place], [place], [address] Phone: [phone]   Fax:  [phone]  Name: Annette Gonzales MRN: [id number] Date of Birth: 06/21/1971

## 2017-06-05 ENCOUNTER — Ambulatory Visit: Payer: BLUE CROSS/BLUE SHIELD | Admitting: Physical Therapy

## 2017-06-06 ENCOUNTER — Other Ambulatory Visit: Payer: Self-pay

## 2017-06-06 ENCOUNTER — Ambulatory Visit: Payer: BLUE CROSS/BLUE SHIELD | Admitting: Physical Therapy

## 2017-06-06 ENCOUNTER — Encounter: Payer: Self-pay | Admitting: Physical Therapy

## 2017-06-06 DIAGNOSIS — M25512 Pain in left shoulder: Secondary | ICD-10-CM | POA: Diagnosis not present

## 2017-06-06 DIAGNOSIS — M62838 Other muscle spasm: Secondary | ICD-10-CM

## 2017-06-06 DIAGNOSIS — G8929 Other chronic pain: Secondary | ICD-10-CM | POA: Diagnosis not present

## 2017-06-06 DIAGNOSIS — M6281 Muscle weakness (generalized): Secondary | ICD-10-CM

## 2017-06-07 ENCOUNTER — Ambulatory Visit: Payer: BLUE CROSS/BLUE SHIELD | Admitting: Physical Therapy

## 2017-06-07 NOTE — Therapy (Signed)
[facility] [address], [place], [address] Phone: [phone]   Fax:  [phone]  Physical Therapy Treatment  Patient Details  Name: Annette Gonzales MRN: [id number] Date of Birth: [birth year] Referring Provider: Benny Lennert MD   Encounter Date: 06/06/2017  PT End of Session - 06/06/17 0927    Visit Number  4    Number of Visits  12    Date for PT Re-Evaluation  07/05/17    PT Start Time  0917    PT Stop Time  1005    PT Time Calculation (min)  48 min    Activity Tolerance  Patient tolerated treatment well    Behavior During Therapy  [facility] for tasks assessed/performed       Past Medical History:  Diagnosis Date  . Asthma   . Depression   . Diverticulitis   . Frequent headaches   . GERD (gastroesophageal reflux disease)   . History of kidney stones   . History of stomach ulcers history of mouth ulcers  . Hyperlipidemia   . Hypertension   . Urinary tract bacterial infections     Past Surgical History:  Procedure Laterality Date  . ABDOMINAL HYSTERECTOMY  2005   Uterus only  . APPENDECTOMY  1985  . HERNIA REPAIR  2012  . LEEP    . TONSILLECTOMY    . TONSILLECTOMY AND ADENOIDECTOMY  1989    There were no vitals filed for this visit.  Subjective Assessment - 06/06/17 0919    Subjective  Patient reports she is having increased pain in her left shoulder over the past few days and she woke up with increased soreness and decreased mobility in her left shoulder; may be due to cold rainy weather.     Pertinent History  02/2016 began left shoulder pain; remodeling kitchen at that time. initially was having pain with reaching and raising arm out to side and pain and limitations have gotten progressively worse. had injection 03/2017 with good results.     Limitations  Lifting;House hold activities;Other (comment) personal care    Diagnostic tests  MRI      Patient Stated Goals  improve ROM and be  pain free to return to prior level of function    Currently in Pain?  Yes    Pain Score  4     Pain Location  Shoulder    Pain Orientation  Left    Pain Descriptors / Indicators  Aching    Pain Type  Chronic pain    Pain Onset  More than a month ago    Pain Frequency  Intermittent         Objective: Pretreatment:FF left shoulder AROM 140;post treatment:160 degrees Palpation: + spasms left upper trapezius and along medial border of scapula and lats.   Treatment: Modalities: Electrical stimulation:46min.;Russian stim. 10/10 cycle applied (2) electrodes toleft shoulder periscapular and lower trapezius musclewith patient seatedwithUE supported on pillow; pt. Performingscapular retractionwith each cycle; goal muscle re education  Ultrasound x 10 min. Applied to left shoulder anterior and lateral aspect 1.1 w/cm2 50% patient seated with left UE supported on pillow goal: pain, inflammation; performed US in conjunction with electrical stimulation for pain control; no adverse reactions noted  Manual therapy: 8 min. Goal; soft tissue elasticity, spasms STM performed to left shoulder girdle with concentration on lats.with patient supine lying, superficial techniques, in conjunction with exercises: AAROM to improve forward elevation with decreased pain end  range  Therapeutic exercises: patient performed with demonstration, instruction, VC and tactile cues of therapist: goal: improve functional use left UE, QuickDash, ROM and strength Supine lying:  Rhythmic stabilization at 90 degrees and neutral rotations with UE in scapular plane: 2 sets 10 reps each AAROM forward elevation left shoulder multiple sets/reps Standing at wall:  Scapular control with resistive band for pizza carry ER of shoulders OMEGA: Standing straight arm pull downs 15# x 15 reps Standing single arm row each UE x 15 reps 10#   Patient response to treatment: Patient demonstrated improved technique  and alignment with demonstration, tactile and VC. Improved motor control with repetition. Decreased spasms and improved soft tissue elasticity by 50%, allowing improved overhead forward elevation by 15 degrees with decreased shoulder pain. Full contraction achieved with improved motor control following estim.        PT Education - 06/06/17 [phone]    Education provided  Yes    Education Details  exercise instruction; re assessed home exercises    Person(s) Educated  Patient    Methods  Explanation;Demonstration;Verbal cues    Comprehension  Verbal cues required;Returned demonstration;Verbalized understanding          PT Long Term Goals - 05/24/17 1039      PT LONG TERM GOAL #1   Title   Patient will demonstrate improved function and decreased pain in left shoulder as indicated by QuickDash score of 30% or better    Baseline  quickDash 48%    Status  New    Target Date  06/14/17      PT LONG TERM GOAL #2   Title   Patient will demonstrate improved function and decreased pain in shoulder as indicated by QuickDash score of <18%    Baseline  48%    Status  New    Target Date  07/05/17      PT LONG TERM GOAL #3   Title  of  Patient will be independent with home program for pain control, exercises for flexibility and strength to allow transition to self management once discharged from physical therapy    Status  New    Target Date  07/05/17            Plan - 06/06/17 0928    Clinical Impression Statement  Patient demonstrates improving strength, motor control, ROM and decreasing pain with current treatment. She has improved AROM from 140 to 160 degrees elevation following STM and exercises today and should continue to progress with additional physical therapy intervention to address limitations.    Rehab Potential  Good    Clinical Impairments Affecting Rehab Potential  (+)age, motivated, prior level of function    PT Frequency  2x / week    PT Duration  6 weeks    PT  Treatment/Interventions  Iontophoresis 4mg /ml Dexamethasone;Electrical Stimulation;Cryotherapy;Moist Heat;Ultrasound;Patient/family education;Neuromuscular re-education;Therapeutic exercise;Manual techniques;Dry needling    PT Next Visit Plan  pain control, manual techniques, progress exercises for scapular stabilization    PT Home Exercise Plan  scapular retraction, AAROM left shoulder supine, strengthening with resistive band for scapular retraction       Patient will benefit from skilled therapeutic intervention in order to improve the following deficits and impairments:  Decreased strength, Pain, Impaired perceived functional ability, Decreased activity tolerance, Decreased knowledge of precautions, Increased muscle spasms, Impaired UE functional use, Decreased range of motion  Visit Diagnosis: Chronic left shoulder pain  Other muscle spasm  Muscle weakness (generalized)     Problem List Patient  Active Problem List   Diagnosis Date Noted  . Screen for colon cancer 04/03/2017  . Stress incontinence of urine 07/07/2016  . History of asthma 04/24/2015  . Depression with anxiety 04/24/2015  . Right knee pain 04/24/2015  . Diverticulosis of colon without hemorrhage 04/24/2015  . Frequent headaches 04/24/2015  . GERD (gastroesophageal reflux disease) 04/24/2015  . History of gastric ulcer 04/24/2015  . Benign essential HTN 04/24/2015  . HLD (hyperlipidemia) 04/24/2015  . History of nephrolithiasis 04/24/2015  . Overweight 04/24/2015  . Encounter to establish care 04/24/2015    Beacher MayBrooks, Marie PT 06/07/2017, 10:46 AM   [facility] [address] [place], [place], [address] Phone: [phone]   Fax:  [phone]  Name: Annette Gonzales MRN: [id number] Date of Birth: 03/22/1971

## 2017-06-08 ENCOUNTER — Ambulatory Visit: Payer: BLUE CROSS/BLUE SHIELD | Admitting: Physical Therapy

## 2017-06-13 ENCOUNTER — Encounter: Payer: Self-pay | Admitting: Physical Therapy

## 2017-06-13 ENCOUNTER — Ambulatory Visit: Payer: BLUE CROSS/BLUE SHIELD | Admitting: Physical Therapy

## 2017-06-13 ENCOUNTER — Other Ambulatory Visit: Payer: Self-pay

## 2017-06-13 DIAGNOSIS — M6281 Muscle weakness (generalized): Secondary | ICD-10-CM | POA: Diagnosis not present

## 2017-06-13 DIAGNOSIS — M25512 Pain in left shoulder: Secondary | ICD-10-CM | POA: Diagnosis not present

## 2017-06-13 DIAGNOSIS — G8929 Other chronic pain: Secondary | ICD-10-CM | POA: Diagnosis not present

## 2017-06-13 DIAGNOSIS — M62838 Other muscle spasm: Secondary | ICD-10-CM

## 2017-06-14 NOTE — Therapy (Signed)
[place] [facility] [address] [place], [address] Phone: [phone]   Fax:  [phone]  Physical Therapy Treatment  Patient Details  Name: Annette Gonzales MRN: [id number] Date of Birth: 01-Jun-1971 Referring Provider: Benny Lennert MD   Encounter Date: 06/13/2017  PT End of Session - 06/13/17 1200    Visit Number  5    Number of Visits  12    Date for PT Re-Evaluation  07/05/17    PT Start Time  0950    PT Stop Time  1033    PT Time Calculation (min)  43 min    Activity Tolerance  Patient tolerated treatment well    Behavior During Therapy  [facility] for tasks assessed/performed       Past Medical History:  Diagnosis Date  . Asthma   . Depression   . Diverticulitis   . Frequent headaches   . GERD (gastroesophageal reflux disease)   . History of kidney stones   . History of stomach ulcers history of mouth ulcers  . Hyperlipidemia   . Hypertension   . Urinary tract bacterial infections     Past Surgical History:  Procedure Laterality Date  . ABDOMINAL HYSTERECTOMY  2005   Uterus only  . APPENDECTOMY  1985  . HERNIA REPAIR  2012  . LEEP    . TONSILLECTOMY    . TONSILLECTOMY AND ADENOIDECTOMY  1989    There were no vitals filed for this visit.  Subjective Assessment - 06/13/17 1000    Subjective  Still pain with movement moderate intensity and at rest intermittently pain wakes her up and she has a dull ache currently    Pertinent History  02/2016 began left shoulder pain; remodeling kitchen at that time. initially was having pain with reaching and raising arm out to side and pain and limitations have gotten progressively worse. had injection 03/2017 with good results.     Limitations  Lifting;House hold activities;Other (comment) personal care    Diagnostic tests  MRI      Patient Stated Goals  improve ROM and be pain free to return to prior level of function    Currently in Pain?  Yes    Pain  Score  4     Pain Location  Shoulder    Pain Orientation  Left    Pain Descriptors / Indicators  Aching;Dull    Pain Type  Chronic pain    Pain Onset  More than a month ago    Pain Frequency  Intermittent       Objective: Pretreatment:FF left shoulder AROM 160 with pain end range;post treatment 160 without pain Palpation: + spasms left upper trapezius and along medial border of scapula and lats.   Treatment: Modalities: Electrical stimulation:98min.;Russian stim. 10/10 cycle applied (2) electrodes toleft shoulder periscapular and lower trapezius musclewith patient seatedwithUE supported on pillow; pt. Performingscapular retractionwith each cycle; goal muscle re education  Ultrasound x 10 min. Applied to left shoulder anterior and lateral aspect 1.1 w/cm2 50% patient seated with left UE supported on pillow goal: pain, inflammation; performed US prior to exercise for pain control; no adverse reactions noted  Manual therapy: 8 min. Goal; soft tissue elasticity, spasms STM performed to left shoulder girdle with concentration on lats.with patient supine lying, superficial techniques, in conjunction with exercises: AAROM to improve forward elevation with decreased pain end range  Therapeutic exercises: patient performed with demonstration, instruction, VC and tactile cues of therapist:  goal: improve functional use left UE, QuickDash, ROM and strength Supine lying:  Rhythmic stabilization at 90 degrees and neutral rotations with UE in scapular plane: 2 sets 10 reps each AAROM forward elevation left shoulder multiple sets/reps  Patient response to treatment: Improved pain free ROM following manual therapy and US. No pain at end of session.    PT Education - 06/13/17 1100    Education provided  Yes    Education Details  exercise instruction, re assessed home program    Person(s) Educated  Patient    Methods  Explanation    Comprehension  Verbalized  understanding          PT Long Term Goals - 05/24/17 1039      PT LONG TERM GOAL #1   Title   Patient will demonstrate improved function and decreased pain in left shoulder as indicated by QuickDash score of 30% or better    Baseline  quickDash 48%    Status  New    Target Date  06/14/17      PT LONG TERM GOAL #2   Title   Patient will demonstrate improved function and decreased pain in shoulder as indicated by QuickDash score of <18%    Baseline  48%    Status  New    Target Date  07/05/17      PT LONG TERM GOAL #3   Title  of  Patient will be independent with home program for pain control, exercises for flexibility and strength to allow transition to self management once discharged from physical therapy    Status  New    Target Date  07/05/17            Plan - 06/13/17 1210    Clinical Impression Statement  Patient demonstrated decreased pain to 0/10 with treatment. she continues to progress well towards goals and should continue to improve with additional physical therapy intervention.     Rehab Potential  Good    Clinical Impairments Affecting Rehab Potential  (+)age, motivated, prior level of function    PT Frequency  2x / week    PT Duration  6 weeks    PT Treatment/Interventions  Iontophoresis 4mg /ml Dexamethasone;Electrical Stimulation;Cryotherapy;Moist Heat;Ultrasound;Patient/family education;Neuromuscular re-education;Therapeutic exercise;Manual techniques;Dry needling    PT Next Visit Plan  pain control, manual techniques, progress exercises for scapular stabilization    PT Home Exercise Plan  scapular retraction, AAROM left shoulder supine, strengthening with resistive band for scapular retraction       Patient will benefit from skilled therapeutic intervention in order to improve the following deficits and impairments:  Decreased strength, Pain, Impaired perceived functional ability, Decreased activity tolerance, Decreased knowledge of precautions, Increased  muscle spasms, Impaired UE functional use, Decreased range of motion  Visit Diagnosis: Chronic left shoulder pain  Other muscle spasm  Muscle weakness (generalized)     Problem List Patient Active Problem List   Diagnosis Date Noted  . Screen for colon cancer 04/03/2017  . Stress incontinence of urine 07/07/2016  . History of asthma 04/24/2015  . Depression with anxiety 04/24/2015  . Right knee pain 04/24/2015  . Diverticulosis of colon without hemorrhage 04/24/2015  . Frequent headaches 04/24/2015  . GERD (gastroesophageal reflux disease) 04/24/2015  . History of gastric ulcer 04/24/2015  . Benign essential HTN 04/24/2015  . HLD (hyperlipidemia) 04/24/2015  . History of nephrolithiasis 04/24/2015  . Overweight 04/24/2015  . Encounter to establish care 04/24/2015    Beacher MayBrooks, Marie PT 06/14/2017, 5:00 PM   [facility] [address] [place], [place], [address] Phone: [phone]   Fax:  [phone]  Name: Duanne LimerickMelinda B Treanor MRN: [id number] Date of Birth: 09/04/1970

## 2017-06-20 ENCOUNTER — Encounter: Payer: Self-pay | Admitting: Physical Therapy

## 2017-06-20 ENCOUNTER — Ambulatory Visit: Payer: BLUE CROSS/BLUE SHIELD | Admitting: Physical Therapy

## 2017-06-20 DIAGNOSIS — M62838 Other muscle spasm: Secondary | ICD-10-CM | POA: Diagnosis not present

## 2017-06-20 DIAGNOSIS — M25512 Pain in left shoulder: Secondary | ICD-10-CM | POA: Diagnosis not present

## 2017-06-20 DIAGNOSIS — M6281 Muscle weakness (generalized): Secondary | ICD-10-CM | POA: Diagnosis not present

## 2017-06-20 DIAGNOSIS — G8929 Other chronic pain: Secondary | ICD-10-CM | POA: Diagnosis not present

## 2017-06-20 NOTE — Therapy (Signed)
[place] [facility] [address] [place], [address] Phone: [phone]   Fax:  [phone]  Physical Therapy Treatment  Patient Details  Name: ERYN MARANDOLA MRN: [id number] Date of Birth: 04-30-1971 Referring Provider: Benny Lennert MD   Encounter Date: 06/20/2017  PT End of Session - 06/20/17 1128    Visit Number  6    Number of Visits  12    Date for PT Re-Evaluation  07/05/17    PT Start Time  1119    PT Stop Time  1210    PT Time Calculation (min)  51 min    Activity Tolerance  Patient tolerated treatment well    Behavior During Therapy  [facility] for tasks assessed/performed       Past Medical History:  Diagnosis Date  . Asthma   . Depression   . Diverticulitis   . Frequent headaches   . GERD (gastroesophageal reflux disease)   . History of kidney stones   . History of stomach ulcers history of mouth ulcers  . Hyperlipidemia   . Hypertension   . Urinary tract bacterial infections     Past Surgical History:  Procedure Laterality Date  . ABDOMINAL HYSTERECTOMY  2005   Uterus only  . APPENDECTOMY  1985  . HERNIA REPAIR  2012  . LEEP    . TONSILLECTOMY    . TONSILLECTOMY AND ADENOIDECTOMY  1989    There were no vitals filed for this visit.  Subjective Assessment - 06/20/17 1119    Subjective  Patient reports chief concern is left shoulder pain with sleeping: if she rolls over onto left side the pain wakes her up. functional use during the day continues to improve.     Pertinent History  02/2016 began left shoulder pain; remodeling kitchen at that time. initially was having pain with reaching and raising arm out to side and pain and limitations have gotten progressively worse. had injection 03/2017 with good results.     Limitations  Lifting;House hold activities;Other (comment) personal care    Diagnostic tests  MRI      Patient Stated Goals  improve ROM and be pain free to return to prior level  of function    Currently in Pain?  Yes    Pain Score  4     Pain Location  Shoulder    Pain Orientation  Left    Pain Descriptors / Indicators  Aching;Dull    Pain Type  Chronic pain    Pain Onset  More than a month ago    Pain Frequency  Intermittent       Objective: Pretreatment:FF left shoulder AROM 145 degrees  with pain end range;post treatment 160 with pain end range Palpation: moderate spasms palpable right upper trapezius, posterior aspect right shoulder teres and lats; increased tenderness anterior aspect right shoulder    Treatment: Modalities: Electrical stimulation:1min.;Russian stim. 10/10 cycle applied (2) electrodes toleft shoulder periscapular and lower trapezius musclewith patient seatedwithUE supported on pillow; pt. Performingscapular retractionwith each cycle; goal muscle re education Moist heat applied to left shoulder in conjunction with estim. : goal pain, reduce muscle spasm  Ultrasound x 10 min. Applied to left shoulder anterior and lateral aspect 1.1 w/cm2 50% pulsed with patient seated with left UE supported on pillow goal: pain, inflammation; performed US prior to exercise for pain control; no adverse reactions noted  Manual therapy:43min. Goal; soft tissue elasticity, spasms STM performed to left shoulder girdlewith  concentration upper trapezius muscle and anterior aspect left shoulder with patient seated and  on lats.with patientsupinelying, superficial techniques, in conjunction with exercises: AAROM to improve forward elevation with decreased pain end range  Therapeutic exercises: patient performed with demonstration, instruction, VC and tactile cues of therapist: goal: improve functional use left UE, QuickDash, ROM and strength Supine lying:  Rhythmic stabilization at 90 degrees and neutral rotations with UE in scapular plane: 2 sets 10 reps each AAROM forward elevation left shoulder multiple sets/reps  Patient response  to treatment:Decreased spasms in upper trapezius to mild and decreased tenderness to mild following STM. Improved abiltiy to perform exercises with decreased pain following US. Mild pain continued following treatment, at end of session.      PT Education - 06/20/17 1127    Education provided  Yes    Education Details  exercise instruction     Person(s) Educated  Patient    Methods  Explanation    Comprehension  Verbalized understanding          PT Long Term Goals - 05/24/17 1039      PT LONG TERM GOAL #1   Title   Patient will demonstrate improved function and decreased pain in left shoulder as indicated by QuickDash score of 30% or better    Baseline  quickDash 48%    Status  New    Target Date  06/14/17      PT LONG TERM GOAL #2   Title   Patient will demonstrate improved function and decreased pain in shoulder as indicated by QuickDash score of <18%    Baseline  48%    Status  New    Target Date  07/05/17      PT LONG TERM GOAL #3   Title  of  Patient will be independent with home program for pain control, exercises for flexibility and strength to allow transition to self management once discharged from physical therapy    Status  New    Target Date  07/05/17            Plan - 06/20/17 1215    Clinical Impression Statement  Patient demonstrated decreased spasms in upper trapezius and improving strengt hwith exercises with minimal VC of therapist for corrrect alignment and technique. She continues with pain at night and will benefit from additional physical therapy intervention for pain control and progressive exercises to address limitations.    Rehab Potential  Good    Clinical Impairments Affecting Rehab Potential  (+)age, motivated, prior level of function    PT Frequency  2x / week    PT Duration  6 weeks    PT Treatment/Interventions  Iontophoresis 4mg /ml Dexamethasone;Electrical Stimulation;Cryotherapy;Moist Heat;Ultrasound;Patient/family  education;Neuromuscular re-education;Therapeutic exercise;Manual techniques;Dry needling    PT Next Visit Plan  pain control, manual techniques, progress exercises for scapular stabilization    PT Home Exercise Plan  scapular retraction, AAROM left shoulder supine, strengthening with resistive band for scapular retraction       Patient will benefit from skilled therapeutic intervention in order to improve the following deficits and impairments:  Decreased strength, Pain, Impaired perceived functional ability, Decreased activity tolerance, Decreased knowledge of precautions, Increased muscle spasms, Impaired UE functional use, Decreased range of motion  Visit Diagnosis: Chronic left shoulder pain  Other muscle spasm  Muscle weakness (generalized)     Problem List Patient Active Problem List   Diagnosis Date Noted  . Screen for colon cancer 04/03/2017  . Stress incontinence of urine 07/07/2016  .  History of asthma 04/24/2015  . Depression with anxiety 04/24/2015  . Right knee pain 04/24/2015  . Diverticulosis of colon without hemorrhage 04/24/2015  . Frequent headaches 04/24/2015  . GERD (gastroesophageal reflux disease) 04/24/2015  . History of gastric ulcer 04/24/2015  . Benign essential HTN 04/24/2015  . HLD (hyperlipidemia) 04/24/2015  . History of nephrolithiasis 04/24/2015  . Overweight 04/24/2015  . Encounter to establish care 04/24/2015    Beacher MayBrooks, Essa Malachi PT 06/21/2017, 6:30 AM  [place] [facility] [address] [place], [place], [address] Phone: [phone]   Fax:  [phone]  Name: Duanne LimerickMelinda B Hora MRN: [id number] Date of Birth: 06/23/1971

## 2017-06-22 ENCOUNTER — Encounter: Payer: Self-pay | Admitting: Physical Therapy

## 2017-06-22 ENCOUNTER — Ambulatory Visit: Payer: BLUE CROSS/BLUE SHIELD | Admitting: Physical Therapy

## 2017-06-22 DIAGNOSIS — G8929 Other chronic pain: Secondary | ICD-10-CM | POA: Diagnosis not present

## 2017-06-22 DIAGNOSIS — M6281 Muscle weakness (generalized): Secondary | ICD-10-CM

## 2017-06-22 DIAGNOSIS — M25512 Pain in left shoulder: Principal | ICD-10-CM

## 2017-06-22 DIAGNOSIS — M62838 Other muscle spasm: Secondary | ICD-10-CM | POA: Diagnosis not present

## 2017-06-22 NOTE — Therapy (Signed)
[place] [facility] [address] [place], [place], [address] Phone: [phone]   Fax:  [phone]  Physical Therapy Treatment  Patient Details  Name: Annette Gonzales MRN: [id number] Date of Birth: 08/11/1970 Referring Provider: Benny Lennertollins, Andrew R MD   Encounter Date: 06/22/2017  PT End of Session - 06/22/17 1124    Visit Number  7    Number of Visits  12    Date for PT Re-Evaluation  07/05/17    PT Start Time  1119    PT Stop Time  1210    PT Time Calculation (min)  51 min    Activity Tolerance  Patient tolerated treatment well    Behavior During Therapy  [facility] for tasks assessed/performed       Past Medical History:  Diagnosis Date  . Asthma   . Depression   . Diverticulitis   . Frequent headaches   . GERD (gastroesophageal reflux disease)   . History of kidney stones   . History of stomach ulcers history of mouth ulcers  . Hyperlipidemia   . Hypertension   . Urinary tract bacterial infections     Past Surgical History:  Procedure Laterality Date  . ABDOMINAL HYSTERECTOMY  2005   Uterus only  . APPENDECTOMY  1985  . HERNIA REPAIR  2012  . LEEP    . TONSILLECTOMY    . TONSILLECTOMY AND ADENOIDECTOMY  1989    There were no vitals filed for this visit.  Subjective Assessment - 06/22/17 1121    Subjective  Patient reports she is seeing some improvement with decreasing pain in left shoulder today.    Pertinent History  02/2016 began left shoulder pain; remodeling kitchen at that time. initially was having pain with reaching and raising arm out to side and pain and limitations have gotten progressively worse. had injection 03/2017 with good results.     Limitations  Lifting;House hold activities;Other (comment) personal care    Diagnostic tests  MRI      Patient Stated Goals  improve ROM and be pain free to return to prior level of function    Currently in Pain?  Yes    Pain Score  2     Pain Location   Shoulder    Pain Orientation  Left    Pain Descriptors / Indicators  Sore with movement    Pain Type  Chronic pain    Pain Onset  More than a month ago    Pain Frequency  Intermittent       Objective: Pretreatment:FF left shoulder AROM 165 degrees with pain end range Palpation: mild spasms palpable right upper trapezius, posterior aspect right shoulder teres and lats; mild tenderness anterior aspect right shoulder    Treatment: Modalities: performed at end of session Electrical stimulation:5615min.;Russian stim. 10/10 cycle applied (2) electrodes toleft shoulder periscapular and lower trapezius musclewith patient seatedwithUE supported on pillow; pt. Performingscapular retractionwith each cycle; goal muscle re education High volt muscle spasm clinical protocol applied to left upper trapezius and anterior aspect of left shoulder with patient seated, pillow supporting UE.: goal: spasm, pain Moist heat applied to left shoulder in conjunction with estim. : goal pain, reduce muscle spasm  Ultrasound x 10 min. Applied to left shoulder anterior and lateral aspect and upper trapeizus 3MHz 1.1 w/cm2 50% pulsed with patient seated with left UE supported on pillow goal: pain, inflammation; performed US prior to exercise forpain control; no adverse reactions noted  Manual therapy:510min.  Goal; soft tissue elasticity, spasms STM performed to left shoulder girdlewith concentration upper trapezius muscle and anterior aspect left shoulder with patient seated and on lats.with patientsupinelying, superficial techniques, in conjunction with exercises: AAROM to improve forward elevation with decreased pain end range  Therapeutic exercises: patient performed with demonstration, instruction, VC and tactile cues of therapist: goal: improve functional use left UE, QuickDash, ROM and strength Supine lying:  Rhythmic stabilization at 90 degrees and neutral rotations with UE in scapular plane:  2 sets 10 reps each Side lying upper trapezius stretch 3 x 15 seconds Side lying scapular control exercise: protraction, retraction x 10 AAROM forward elevation left shoulder multiple sets/reps  Patient response to treatment:Improved soft tissue elasticity with decreased spasms noted in upper trapezius and left shoulder following [place], STM. Improved scapular retraction following estim.    PT Education - 06/22/17 1123    Education provided  Yes    Education Details  exercise instruction; instruction for posture with decrease hiking of shoulder to decrease spasms and allow improved ROM with less shoulder pain   Person(s) Educated  Patient    Methods  Explanation;Verbal cues    Comprehension  Verbalized understanding;Verbal cues required          PT Long Term Goals - 05/24/17 1039      PT LONG TERM GOAL #1   Title   Patient will demonstrate improved function and decreased pain in left shoulder as indicated by QuickDash score of 30% or better    Baseline  quickDash 48%    Status  New    Target Date  06/14/17      PT LONG TERM GOAL #2   Title   Patient will demonstrate improved function and decreased pain in shoulder as indicated by QuickDash score of <18%    Baseline  48%    Status  New    Target Date  07/05/17      PT LONG TERM GOAL #3   Title  of  Patient will be independent with home program for pain control, exercises for flexibility and strength to allow transition to self management once discharged from physical therapy    Status  New    Target Date  07/05/17            Plan - 06/22/17 1554    Clinical Impression Statement  Patient demonstrates improvement with decreasing pain from previous session and improved AROM to 165 left shoulder flexion with mild discomfort. She is more aware of posture and hiking of left shoulder as a contributing factor to continue pain in her shoulder. She will benefit from continued physical therapy intervention to address limitations and  achieve goals .    Rehab Potential  Good    Clinical Impairments Affecting Rehab Potential  (+)age, motivated, prior level of function    PT Frequency  2x / week    PT Duration  6 weeks    PT Treatment/Interventions  Iontophoresis 4mg /ml Dexamethasone;Electrical Stimulation;Cryotherapy;Moist Heat;Ultrasound;Patient/family education;Neuromuscular re-education;Therapeutic exercise;Manual techniques;Dry needling    PT Next Visit Plan  pain control, manual techniques, progress exercises for scapular stabilization    PT Home Exercise Plan  scapular retraction, AAROM left shoulder supine, strengthening with resistive band for scapular retraction       Patient will benefit from skilled therapeutic intervention in order to improve the following deficits and impairments:  Decreased strength, Pain, Impaired perceived functional ability, Decreased activity tolerance, Decreased knowledge of precautions, Increased muscle spasms, Impaired UE functional use, Decreased range of  motion  Visit Diagnosis: Chronic left shoulder pain  Other muscle spasm  Muscle weakness (generalized)     Problem List Patient Active Problem List   Diagnosis Date Noted  . Screen for colon cancer 04/03/2017  . Stress incontinence of urine 07/07/2016  . History of asthma 04/24/2015  . Depression with anxiety 04/24/2015  . Right knee pain 04/24/2015  . Diverticulosis of colon without hemorrhage 04/24/2015  . Frequent headaches 04/24/2015  . GERD (gastroesophageal reflux disease) 04/24/2015  . History of gastric ulcer 04/24/2015  . Benign essential HTN 04/24/2015  . HLD (hyperlipidemia) 04/24/2015  . History of nephrolithiasis 04/24/2015  . Overweight 04/24/2015  . Encounter to establish care 04/24/2015    Beacher MayBrooks, Jalaiya Oyster PT 06/22/2017, 3:57 PM  [place] [facility] [address] [place], [place], [address] Phone: [phone]   Fax:  [phone]  Name:  Annette Gonzales MRN: [id number] Date of Birth: 02/17/1971

## 2017-06-26 ENCOUNTER — Encounter: Payer: BLUE CROSS/BLUE SHIELD | Admitting: Physical Therapy

## 2017-06-26 ENCOUNTER — Ambulatory Visit: Payer: BLUE CROSS/BLUE SHIELD | Admitting: Physical Therapy

## 2017-06-27 ENCOUNTER — Encounter: Payer: BLUE CROSS/BLUE SHIELD | Admitting: Physical Therapy

## 2017-06-29 ENCOUNTER — Encounter: Payer: BLUE CROSS/BLUE SHIELD | Admitting: Physical Therapy

## 2017-07-03 ENCOUNTER — Ambulatory Visit: Payer: BLUE CROSS/BLUE SHIELD | Admitting: Physical Therapy

## 2017-07-05 ENCOUNTER — Ambulatory Visit: Payer: BLUE CROSS/BLUE SHIELD | Admitting: Physical Therapy

## 2017-10-18 ENCOUNTER — Encounter: Payer: Self-pay | Admitting: Family

## 2017-11-24 ENCOUNTER — Telehealth: Payer: Self-pay | Admitting: Family

## 2017-11-24 NOTE — Telephone Encounter (Unsigned)
Copied from CRM [id number]. Topic: Quick Communication - Rx Refill/Question >> Nov 24, 2017  5:05 PM Raquel Sarna wrote: lisinopril (PRINIVIL,ZESTRIL) 10 MG tablet  Out of refills - needing asap  [facility] [employer] - Phone: [phone]

## 2017-11-27 ENCOUNTER — Other Ambulatory Visit: Payer: Self-pay

## 2017-11-27 DIAGNOSIS — I10 Essential (primary) hypertension: Secondary | ICD-10-CM

## 2017-11-27 MED ORDER — LISINOPRIL 10 MG PO TABS: 10.0000 mg | ORAL_TABLET | Freq: Every day | ORAL | 0 refills | Status: DC

## 2017-12-21 ENCOUNTER — Other Ambulatory Visit: Payer: Self-pay

## 2017-12-21 DIAGNOSIS — F418 Other specified anxiety disorders: Secondary | ICD-10-CM

## 2017-12-21 DIAGNOSIS — I10 Essential (primary) hypertension: Secondary | ICD-10-CM

## 2017-12-21 MED ORDER — ESCITALOPRAM OXALATE 10 MG PO TABS: 10.0000 mg | ORAL_TABLET | Freq: Every day | ORAL | 0 refills | Status: DC

## 2017-12-21 MED ORDER — BUSPIRONE HCL 7.5 MG PO TABS: 7.5000 mg | ORAL_TABLET | Freq: Three times a day (TID) | ORAL | 0 refills | Status: DC

## 2017-12-21 MED ORDER — LISINOPRIL 10 MG PO TABS: 10.0000 mg | ORAL_TABLET | Freq: Every day | ORAL | 0 refills | Status: DC

## 2018-03-07 ENCOUNTER — Other Ambulatory Visit: Payer: Self-pay | Admitting: Family

## 2018-03-07 DIAGNOSIS — I10 Essential (primary) hypertension: Secondary | ICD-10-CM

## 2018-03-07 DIAGNOSIS — F418 Other specified anxiety disorders: Secondary | ICD-10-CM

## 2018-06-03 ENCOUNTER — Other Ambulatory Visit: Payer: Self-pay | Admitting: Family

## 2018-06-03 DIAGNOSIS — I10 Essential (primary) hypertension: Secondary | ICD-10-CM

## 2018-06-03 DIAGNOSIS — F418 Other specified anxiety disorders: Secondary | ICD-10-CM

## 2018-09-03 ENCOUNTER — Other Ambulatory Visit: Payer: Self-pay | Admitting: Family

## 2018-09-03 DIAGNOSIS — F418 Other specified anxiety disorders: Secondary | ICD-10-CM

## 2018-09-03 NOTE — Telephone Encounter (Signed)
Refilled: 06/04/2018 Last OV: 04/03/2017 Next OV: not scheduled

## 2018-09-05 NOTE — Telephone Encounter (Signed)
Call pt She needs apppt for medication.   30 day supply of lexapro given

## 2018-09-13 ENCOUNTER — Encounter: Payer: Self-pay | Admitting: Family

## 2018-09-13 NOTE — Telephone Encounter (Signed)
Tried to call patient twice & was sent to VM. LM to call back to schedule f/u appointment.

## 2018-09-17 ENCOUNTER — Ambulatory Visit: Payer: PRIVATE HEALTH INSURANCE | Admitting: Family

## 2018-09-17 ENCOUNTER — Encounter: Payer: Self-pay | Admitting: Family

## 2018-09-17 VITALS — BP 98/64 | HR 104 | Temp 98.9°F | Wt 147.6 lb

## 2018-09-17 DIAGNOSIS — J309 Allergic rhinitis, unspecified: Secondary | ICD-10-CM | POA: Insufficient documentation

## 2018-09-17 DIAGNOSIS — M25562 Pain in left knee: Secondary | ICD-10-CM | POA: Diagnosis not present

## 2018-09-17 DIAGNOSIS — F418 Other specified anxiety disorders: Secondary | ICD-10-CM

## 2018-09-17 DIAGNOSIS — M25561 Pain in right knee: Secondary | ICD-10-CM | POA: Diagnosis not present

## 2018-09-17 DIAGNOSIS — J3089 Other allergic rhinitis: Secondary | ICD-10-CM | POA: Insufficient documentation

## 2018-09-17 DIAGNOSIS — I10 Essential (primary) hypertension: Secondary | ICD-10-CM | POA: Diagnosis not present

## 2018-09-17 MED ORDER — FLUTICASONE PROPIONATE 50 MCG/ACT NA SUSP: 2.0000 | Freq: Every day | NASAL | 3 refills | Status: DC

## 2018-09-17 MED ORDER — MONTELUKAST SODIUM 10 MG PO TABS: 10.0000 mg | ORAL_TABLET | Freq: Every day | ORAL | 3 refills | Status: DC

## 2018-09-17 NOTE — Progress Notes (Signed)
Subjective:    Patient ID: Annette Gonzales, female    DOB: Aug 31, 1970, 48 y.o.   MRN: [id number]  CC: [name] is a 48 y.o. female who presents today for follow up and to establish care.   HPI: Accompanied by partner.   HTN- doing well on lisinopril. No Cp.   Depression- doing well on lexapro. Concerned with weight gain. Had been on zoloft in past however said face became numb. Doing well on buspar prn. No si/ hi. No h/o seizures. 2 glasses per week.   Diagnosed with [facility] with [facility]. She is concern for autoimmune arthritis.   Bilateral knees are achy, worse in the morning,  for a long time, worsened over past 4 months. Stairs are hard. No swelling. Occasional right ankle will hurt. No injury. No pain in wrists, hand.   sister has RA.   Complains sneezing year round, worsened over the past 3 months. Taking benadryl, claritin without relief. Worse with dust. Ears itch all the time. Sometimes will have clear nasal congestion. No fever, severe ha, vision loss, wheezing, sob, epigastric burning. Has dogs at home.   H/o asthma. Has albuterol inhaler.      HISTORY:  Past Medical History:  Diagnosis Date  . Asthma   . Depression   . Diverticulitis   . Frequent headaches   . GERD (gastroesophageal reflux disease)   . History of kidney stones   . History of stomach ulcers history of mouth ulcers  . Hyperlipidemia   . Hypertension   . Urinary tract bacterial infections    Past Surgical History:  Procedure Laterality Date  . ABDOMINAL HYSTERECTOMY  2005   Uterus only  . APPENDECTOMY  1985  . HERNIA REPAIR  2012  . LEEP    . TONSILLECTOMY    . TONSILLECTOMY AND ADENOIDECTOMY  1989   Family History  Problem Relation Age of Onset  . Arthritis Mother   . Cancer Mother        Breast Cancer  . Heart disease Mother        Mitral valve disease  . Hyperlipidemia Father   . Heart disease Father   . Stroke Father   . Hypertension Father   . Arthritis Maternal  Grandmother   . Mental illness Maternal Grandmother   . Cancer Maternal Grandfather        Colon/ Prostate Cancer  . Heart disease Maternal Grandfather   . Stroke Maternal Grandfather   . Diabetes Maternal Grandfather   . Arthritis Paternal Grandmother   . Hypertension Paternal Grandmother        renal artery stenosis  . Heart attack Paternal Grandfather   . Hypertension Sister   . Heart disease Sister        tachycardia s/p ablation    Allergies: Sulfa antibiotics Current Outpatient Medications on File Prior to Visit  Medication Sig Dispense Refill  . albuterol (PROVENTIL HFA;VENTOLIN HFA) 108 (90 Base) MCG/ACT inhaler Inhale 2 puffs into the lungs every 6 (six) hours as needed for wheezing or shortness of breath. 1 Inhaler 0  . busPIRone (BUSPAR) 7.5 MG tablet Take 1 tablet (7.5 mg total) by mouth 3 (three) times daily. 270 tablet 0  . escitalopram (LEXAPRO) 10 MG tablet Take 1 tablet by mouth daily. 30 tablet 0  . lisinopril (PRINIVIL,ZESTRIL) 10 MG tablet Take 1 tablet by mouth daily. 90 tablet 0   No current facility-administered medications on file prior to visit.     Social History  Tobacco Use  . Smoking status: Never Smoker  . Smokeless tobacco: Never Used  Substance Use Topics  . Alcohol use: No    Alcohol/week: 0.0 standard drinks  . Drug use: No    Review of Systems  Constitutional: Negative for chills and fever.  HENT: Positive for postnasal drip and sneezing. Negative for ear discharge, ear pain and sinus pressure.   Respiratory: Negative for cough, shortness of breath and wheezing.   Cardiovascular: Negative for chest pain and palpitations.  Gastrointestinal: Negative for nausea and vomiting.  Musculoskeletal: Positive for arthralgias (knees).      Objective:    BP 98/64 (BP Location: Left Arm, Patient Position: Sitting, Cuff Size: Large)   Pulse (!) 104   Temp 98.9 F (37.2 C)   Wt 147 lb 9.6 oz (67 kg)   SpO2 98%   BMI 26.15 kg/m  BP Readings  from Last 3 Encounters:  09/17/18 98/64  04/03/17 130/80  07/07/16 132/88   Wt Readings from Last 3 Encounters:  09/17/18 147 lb 9.6 oz (67 kg)  04/03/17 149 lb 12.8 oz (67.9 kg)  07/07/16 150 lb 6.4 oz (68.2 kg)    Physical Exam Vitals signs reviewed.  Constitutional:      Appearance: She is well-developed.  HENT:     Head: Normocephalic and atraumatic.     Right Ear: Hearing, tympanic membrane, ear canal and external ear normal. No decreased hearing noted. No drainage, swelling or tenderness. No middle ear effusion. No foreign body. Tympanic membrane is not erythematous or bulging.     Left Ear: Hearing, tympanic membrane, ear canal and external ear normal. No decreased hearing noted. No drainage, swelling or tenderness.  No middle ear effusion. No foreign body. Tympanic membrane is not erythematous or bulging.     Nose: Nose normal. No rhinorrhea.     Right Sinus: No maxillary sinus tenderness or frontal sinus tenderness.     Left Sinus: No maxillary sinus tenderness or frontal sinus tenderness.     Mouth/Throat:     Pharynx: Uvula midline. No oropharyngeal exudate or posterior oropharyngeal erythema.     Tonsils: No tonsillar abscesses.  Eyes:     Conjunctiva/sclera: Conjunctivae normal.  Cardiovascular:     Rate and Rhythm: Regular rhythm.     Pulses: Normal pulses.     Heart sounds: Normal heart sounds.  Pulmonary:     Effort: Pulmonary effort is normal.     Breath sounds: Normal breath sounds. No wheezing, rhonchi or rales.  Lymphadenopathy:     Head:     Right side of head: No submental, submandibular, tonsillar, preauricular, posterior auricular or occipital adenopathy.     Left side of head: No submental, submandibular, tonsillar, preauricular, posterior auricular or occipital adenopathy.     Cervical: No cervical adenopathy.  Skin:    General: Skin is warm and dry.  Neurological:     Mental Status: She is alert.  Psychiatric:        Speech: Speech normal.         Behavior: Behavior normal.        Thought Content: Thought content normal.        Assessment & Plan:   Problem List Items Addressed This Visit      Cardiovascular and Mediastinum   Benign essential HTN    At goal. Continue regimen.       Relevant Orders   Comprehensive metabolic panel (Completed)     Respiratory   Allergic rhinitis - Primary  Normal HEENT exam. Trial of flonase, singulair. If no improvement, will consult ENT, allergy.      Relevant Medications   fluticasone (FLONASE) 50 MCG/ACT nasal spray     Other   Depression with anxiety    Wean off of lexapro; trial of wellbutrin. Close follow up.      Relevant Medications   buPROPion (WELLBUTRIN XL) 150 MG 24 hr tablet   Arthralgia of both knees    Discussed likely OA versus RA however with positive family history, we agreed to pursue autoimmune labs and consult with rheumatology.       Relevant Orders   ANA (Completed)   C-reactive protein (Completed)   CYCLIC CITRUL PEPTIDE ANTIBODY, IGG/IGA (Completed)   Rheumatoid factor (Completed)   Sedimentation rate (Completed)   Ambulatory referral to Rheumatology       I have discontinued Dessie B. Furey's fluticasone and oxybutynin. I am also having her start on fluticasone and buPROPion. Additionally, I am having her maintain her albuterol, busPIRone, lisinopril, and escitalopram.   Meds ordered this encounter  Medications  . fluticasone (FLONASE) 50 MCG/ACT nasal spray    Sig: Place 2 sprays into both nostrils daily.    Dispense:  16 g    Refill:  3    Order Specific Question:   Supervising Provider    Answer:   Darrick Huntsman, TERESA L [[id number]]  . DISCONTD: montelukast (SINGULAIR) 10 MG tablet    Sig: Take 1 tablet (10 mg total) by mouth at bedtime.    Dispense:  30 tablet    Refill:  3    Order Specific Question:   Supervising Provider    Answer:   Duncan Dull L [[id number]]  . buPROPion (WELLBUTRIN XL) 150 MG 24 hr tablet    Sig: Start 150 mg ER PO qam,  increase after 3 days to 300 mg qam.    Dispense:  60 tablet    Refill:  3    Order Specific Question:   Supervising Provider    Answer:   Sherlene Shams [[id number]]    Return precautions given.   Risks, benefits, and alternatives of the medications and treatment plan prescribed today were discussed, and patient expressed understanding.   Education regarding symptom management and diagnosis given to patient on AVS.  Continue to follow with Allegra Grana, FNP for routine health maintenance.   Annette Gonzales and I agreed with plan.   Rennie Plowman, FNP

## 2018-09-17 NOTE — Patient Instructions (Addendum)
Start 5mg  lexapro tomorrow and stay on 5mg  for 4-5 days, then start taking 5mg  lexapro every other day, for 5 days, then you may stop.   Start wellbutrin on wedsnesday and increase per instructions.   Trial of flonase, singulair. Let me know if not better

## 2018-09-18 ENCOUNTER — Other Ambulatory Visit: Payer: Self-pay

## 2018-09-18 DIAGNOSIS — J309 Allergic rhinitis, unspecified: Secondary | ICD-10-CM

## 2018-09-18 LAB — COMPREHENSIVE METABOLIC PANEL WITH GFR
ALT: 19 U/L (ref 0–35)
AST: 23 U/L (ref 0–37)
Albumin: 4.5 g/dL (ref 3.5–5.2)
Alkaline Phosphatase: 74 U/L (ref 39–117)
BUN: 10 mg/dL (ref 6–23)
CO2: 23 meq/L (ref 19–32)
Calcium: 9 mg/dL (ref 8.4–10.5)
Chloride: 104 meq/L (ref 96–112)
Creatinine, Ser: 0.76 mg/dL (ref 0.40–1.20)
GFR: 81.52 mL/min
Glucose, Bld: 137 mg/dL — ABNORMAL HIGH (ref 70–99)
Potassium: 4.3 meq/L (ref 3.5–5.1)
Sodium: 138 meq/L (ref 135–145)
Total Bilirubin: 0.4 mg/dL (ref 0.2–1.2)
Total Protein: 7.4 g/dL (ref 6.0–8.3)

## 2018-09-18 LAB — RHEUMATOID FACTOR: Rheumatoid fact SerPl-aCnc: <14 [IU]/mL

## 2018-09-18 LAB — C-REACTIVE PROTEIN: CRP: <1 mg/dL (ref 0.5–20.0)

## 2018-09-18 LAB — ANA: Anti Nuclear Antibody(ANA): NEGATIVE

## 2018-09-18 LAB — SEDIMENTATION RATE: Sed Rate: 10 mm/h (ref 0–20)

## 2018-09-18 MED ORDER — MONTELUKAST SODIUM 10 MG PO TABS: 10.0000 mg | ORAL_TABLET | Freq: Every day | ORAL | 3 refills | Status: DC

## 2018-09-19 ENCOUNTER — Other Ambulatory Visit: Payer: Self-pay

## 2018-09-19 ENCOUNTER — Encounter: Payer: Self-pay | Admitting: Family

## 2018-09-19 DIAGNOSIS — I10 Essential (primary) hypertension: Secondary | ICD-10-CM

## 2018-09-19 DIAGNOSIS — F418 Other specified anxiety disorders: Secondary | ICD-10-CM

## 2018-09-19 LAB — CYCLIC CITRUL PEPTIDE ANTIBODY, IGG/IGA: Cyclic Citrullin Peptide Ab: 8 units (ref 0–19)

## 2018-09-19 MED ORDER — BUPROPION HCL ER (XL) 150 MG PO TB24: ORAL_TABLET | ORAL | 3 refills | Status: DC

## 2018-09-19 MED ORDER — LISINOPRIL 10 MG PO TABS: 10.0000 mg | ORAL_TABLET | Freq: Every day | ORAL | 0 refills | Status: DC

## 2018-09-19 NOTE — Assessment & Plan Note (Signed)
Normal HEENT exam. Trial of flonase, singulair. If no improvement, will consult ENT, allergy.

## 2018-09-19 NOTE — Assessment & Plan Note (Signed)
Discussed likely OA versus RA however with positive family history, we agreed to pursue autoimmune labs and consult with rheumatology.

## 2018-09-19 NOTE — Assessment & Plan Note (Signed)
Wean off of lexapro; trial of wellbutrin. Close follow up.

## 2018-09-19 NOTE — Assessment & Plan Note (Signed)
At goal.  Continue regimen. 

## 2018-10-01 ENCOUNTER — Encounter: Payer: Self-pay | Admitting: Family

## 2018-10-03 ENCOUNTER — Other Ambulatory Visit: Payer: Self-pay

## 2018-10-03 ENCOUNTER — Encounter: Payer: Self-pay | Admitting: Family

## 2018-10-03 DIAGNOSIS — J309 Allergic rhinitis, unspecified: Secondary | ICD-10-CM

## 2018-10-03 MED ORDER — FLUTICASONE PROPIONATE 50 MCG/ACT NA SUSP: 2.0000 | Freq: Every day | NASAL | 3 refills | Status: DC

## 2018-10-15 ENCOUNTER — Encounter: Payer: Self-pay | Admitting: Family

## 2018-10-17 ENCOUNTER — Ambulatory Visit: Payer: PRIVATE HEALTH INSURANCE | Admitting: Family

## 2018-11-02 ENCOUNTER — Other Ambulatory Visit: Payer: Self-pay | Admitting: Family

## 2018-11-02 DIAGNOSIS — F418 Other specified anxiety disorders: Secondary | ICD-10-CM

## 2018-12-02 ENCOUNTER — Other Ambulatory Visit: Payer: Self-pay | Admitting: Family

## 2018-12-02 DIAGNOSIS — I10 Essential (primary) hypertension: Secondary | ICD-10-CM

## 2018-12-02 DIAGNOSIS — F418 Other specified anxiety disorders: Secondary | ICD-10-CM

## 2019-01-01 ENCOUNTER — Other Ambulatory Visit: Payer: Self-pay | Admitting: Family

## 2019-01-01 DIAGNOSIS — J309 Allergic rhinitis, unspecified: Secondary | ICD-10-CM

## 2019-01-01 DIAGNOSIS — F418 Other specified anxiety disorders: Secondary | ICD-10-CM

## 2019-01-14 ENCOUNTER — Other Ambulatory Visit: Payer: Self-pay | Admitting: Family

## 2019-01-14 DIAGNOSIS — F418 Other specified anxiety disorders: Secondary | ICD-10-CM

## 2019-01-31 ENCOUNTER — Other Ambulatory Visit: Payer: Self-pay | Admitting: Family

## 2019-01-31 DIAGNOSIS — F418 Other specified anxiety disorders: Secondary | ICD-10-CM

## 2019-01-31 DIAGNOSIS — J309 Allergic rhinitis, unspecified: Secondary | ICD-10-CM

## 2019-01-31 DIAGNOSIS — I10 Essential (primary) hypertension: Secondary | ICD-10-CM

## 2019-02-27 ENCOUNTER — Other Ambulatory Visit: Payer: Self-pay | Admitting: Family

## 2019-02-27 DIAGNOSIS — J309 Allergic rhinitis, unspecified: Secondary | ICD-10-CM

## 2019-05-09 ENCOUNTER — Other Ambulatory Visit (INDEPENDENT_AMBULATORY_CARE_PROVIDER_SITE_OTHER): Payer: PRIVATE HEALTH INSURANCE

## 2019-05-09 ENCOUNTER — Other Ambulatory Visit: Payer: Self-pay

## 2019-05-09 ENCOUNTER — Ambulatory Visit (INDEPENDENT_AMBULATORY_CARE_PROVIDER_SITE_OTHER): Payer: PRIVATE HEALTH INSURANCE | Admitting: Primary Care

## 2019-05-09 ENCOUNTER — Other Ambulatory Visit: Payer: Self-pay | Admitting: Primary Care

## 2019-05-09 ENCOUNTER — Ambulatory Visit
Admission: RE | Admit: 2019-05-09 | Discharge: 2019-05-09 | Disposition: A | Discharge status: Home / Self Care | Payer: PRIVATE HEALTH INSURANCE | Source: Ambulatory Visit | Attending: Primary Care | Admitting: Primary Care

## 2019-05-09 VITALS — Temp 97.0°F | Wt 147.5 lb

## 2019-05-09 DIAGNOSIS — R109 Unspecified abdominal pain: Secondary | ICD-10-CM

## 2019-05-09 DIAGNOSIS — K5792 Diverticulitis of intestine, part unspecified, without perforation or abscess without bleeding: Secondary | ICD-10-CM

## 2019-05-09 LAB — CBC WITH DIFFERENTIAL/PLATELET
Basophils Absolute: 0.1 10*3/uL (ref 0.0–0.1)
Basophils Relative: 0.7 % (ref 0.0–3.0)
Eosinophils Absolute: 0.1 10*3/uL (ref 0.0–0.7)
Eosinophils Relative: 0.9 % (ref 0.0–5.0)
HCT: 40.6 % (ref 36.0–46.0)
Hemoglobin: 13.6 g/dL (ref 12.0–15.0)
Lymphocytes Relative: 17.3 % (ref 12.0–46.0)
Lymphs Abs: 1.6 10*3/uL (ref 0.7–4.0)
MCHC: 33.6 g/dL (ref 30.0–36.0)
MCV: 86.7 fl (ref 78.0–100.0)
Monocytes Absolute: 0.7 10*3/uL (ref 0.1–1.0)
Monocytes Relative: 7.3 % (ref 3.0–12.0)
Neutro Abs: 6.8 10*3/uL (ref 1.4–7.7)
Neutrophils Relative %: 73.8 % (ref 43.0–77.0)
Platelets: 320 10*3/uL (ref 150.0–400.0)
RBC: 4.69 Mil/uL (ref 3.87–5.11)
RDW: 13.6 % (ref 11.5–15.5)
WBC: 9.3 10*3/uL (ref 4.0–10.5)

## 2019-05-09 LAB — BASIC METABOLIC PANEL
BUN: 12 mg/dL (ref 6–23)
CO2: 28 mEq/L (ref 19–32)
Calcium: 9.2 mg/dL (ref 8.4–10.5)
Chloride: 102 mEq/L (ref 96–112)
Creatinine, Ser: 0.81 mg/dL (ref 0.40–1.20)
GFR: 75.54 mL/min (ref >60.00)
Glucose, Bld: 101 mg/dL — ABNORMAL HIGH (ref 70–99)
Potassium: 4 mEq/L (ref 3.5–5.1)
Sodium: 136 mEq/L (ref 135–145)

## 2019-05-09 MED ORDER — CIPROFLOXACIN HCL 500 MG PO TABS: 500.0000 mg | ORAL_TABLET | Freq: Two times a day (BID) | ORAL | 0 refills | Status: DC

## 2019-05-09 MED ORDER — METRONIDAZOLE 500 MG PO TABS: 500.0000 mg | ORAL_TABLET | Freq: Three times a day (TID) | ORAL | 0 refills | Status: DC

## 2019-05-09 NOTE — Progress Notes (Signed)
Subjective:    Patient ID: Annette Gonzales, female    DOB: 07-13-1971, 48 y.o.   MRN: [id number]  HPI  Virtual Visit via Video Note  I connected with Annette Gonzales on 05/09/19 at 10:20 AM EDT by a video enabled telemedicine application and verified that I am speaking with the correct person using two identifiers.  Location: Patient: Home Provider: Office   I discussed the limitations of evaluation and management by telemedicine and the availability of in person appointments. The patient expressed understanding and agreed to proceed.  History of Present Illness:  Annette Gonzales is a 48 year old female with a history of hypertension, allergic rhinitis, asthma, GERD, nephrolithiasis, incontinence of urine, diverticulosis, anxiety who presents today with a chief complaint of flank pain.  Over the last several days she's noticed bilateral flank pain with radiation over to ovaries, sometimes with pain to the ribcage. She describes her pain as a dull ache. Last night she started running a fever (100.7) with body aches, increased dizziness, headaches, urinary frequency, and nausea. Her temperature was 97.0 this morning, took Advil last night. This morning when urinating she felt a "twinge" in her bladder.   She denies dysuria, hematuria, abdominal pain, cough, loss of taste/smell, nasal congestion, known or potential exposure to Covid-19. These symptoms feel similar to her prior kidney stones.    Observations/Objective:  Alert and oriented. Appears well, not sickly. Appears mildly uncomfortable during visit.  Speaking in complete sentences. No cough.  Assessment and Plan:  Bilateral flank pain x several days with radiation to groin. Given history of renal stones will work up. Labs including UA, CBC, BMP pending. Stat CT renal stone study pending. Tylenol and Ibuprofen PRN. Push water intake. Await results.  Follow Up Instructions:  Call our office to schedule a lab appointment as  discussed.  You will be contacted regarding your CT scan.  Please let us know if you have not been contacted within a few hours.  I'll be in touch with your results once received.  It was a pleasure meeting you! Allie Bossier, NP-C    I discussed the assessment and treatment plan with the patient. The patient was provided an opportunity to ask questions and all were answered. The patient agreed with the plan and demonstrated an understanding of the instructions.   The patient was advised to call back or seek an in-person evaluation if the symptoms worsen or if the condition fails to improve as anticipated.    Pleas Koch, NP     Review of Systems  Constitutional: Positive for fever.  HENT: Negative for congestion and sore throat.   Respiratory: Negative for cough.   Gastrointestinal: Positive for nausea. Negative for vomiting.  Genitourinary: Positive for frequency. Negative for dysuria, hematuria and vaginal discharge.       Bilateral flank pain with radiation to groin       Past Medical History:  Diagnosis Date  . Asthma   . Depression   . Diverticulitis   . Frequent headaches   . GERD (gastroesophageal reflux disease)   . History of kidney stones   . History of stomach ulcers history of mouth ulcers  . Hyperlipidemia   . Hypertension   . Urinary tract bacterial infections      Social History   Socioeconomic History  . Marital status: Married    Spouse name: Not on file  . Number of children: Not on file  . Years of education: Not on file  .  Highest education level: Not on file  Occupational History  . Not on file  Social Needs  . Financial resource strain: Not on file  . Food insecurity    Worry: Not on file    Inability: Not on file  . Transportation needs    Medical: Not on file    Non-medical: Not on file  Tobacco Use  . Smoking status: Never Smoker  . Smokeless tobacco: Never Used  Substance and Sexual Activity  . Alcohol use: No     Alcohol/week: 0.0 standard drinks  . Drug use: No  . Sexual activity: Not on file  Lifestyle  . Physical activity    Days per week: Not on file    Minutes per session: Not on file  . Stress: Not on file  Relationships  . Social Musician on phone: Not on file    Gets together: Not on file    Attends religious service: Not on file    Active member of club or organization: Not on file    Attends meetings of clubs or organizations: Not on file    Relationship status: Not on file  . Intimate partner violence    Fear of current or ex partner: Not on file    Emotionally abused: Not on file    Physically abused: Not on file    Forced sexual activity: Not on file  Other Topics Concern  . Not on file  Social History Narrative   Married   Employed as Pt Care Coordinator   Some College    2 children    Past Surgical History:  Procedure Laterality Date  . ABDOMINAL HYSTERECTOMY  2005   Uterus only  . APPENDECTOMY  1985  . HERNIA REPAIR  2012  . LEEP    . TONSILLECTOMY    . TONSILLECTOMY AND ADENOIDECTOMY  1989    Family History  Problem Relation Age of Onset  . Arthritis Mother   . Cancer Mother        Breast Cancer  . Heart disease Mother        Mitral valve disease  . Hyperlipidemia Father   . Heart disease Father   . Stroke Father   . Hypertension Father   . Arthritis Maternal Grandmother   . Mental illness Maternal Grandmother   . Cancer Maternal Grandfather        Colon/ Prostate Cancer  . Heart disease Maternal Grandfather   . Stroke Maternal Grandfather   . Diabetes Maternal Grandfather   . Arthritis Paternal Grandmother   . Hypertension Paternal Grandmother        renal artery stenosis  . Heart attack Paternal Grandfather   . Hypertension Sister   . Heart disease Sister        tachycardia s/p ablation    Allergies  Allergen Reactions  . Sulfa Antibiotics Other (See Comments)    Nausea and hot flashes    Current Outpatient Medications on  File Prior to Visit  Medication Sig Dispense Refill  . albuterol (PROVENTIL HFA;VENTOLIN HFA) 108 (90 Base) MCG/ACT inhaler Inhale 2 puffs into the lungs every 6 (six) hours as needed for wheezing or shortness of breath. 1 Inhaler 0  . buPROPion (WELLBUTRIN XL) 150 MG 24 hr tablet Start 150 mg ER PO qam, increase after 3 days to 300 mg qam. 60 tablet 3  . busPIRone (BUSPAR) 7.5 MG tablet Take 1 tablet (7.5 mg total) by mouth 3 (three) times daily. 270 tablet  0  . escitalopram (LEXAPRO) 10 MG tablet Take 1 tablet by mouth daily. 90 tablet 1  . fluticasone (FLONASE) 50 MCG/ACT nasal spray Place 2 sprays into both nostrils daily. 16 g 2  . lisinopril (ZESTRIL) 10 MG tablet Take 1 tablet (10 mg total) by mouth daily. 90 tablet 1  . montelukast (SINGULAIR) 10 MG tablet Take 1 tablet (  total) by mouth at bedtime. 30 tablet 2   No current facility-administered medications on file prior to visit.     Temp (!) 97 F (36.1 C) (Oral)   Wt 147 lb 8 oz (66.9 kg)   BMI 26.13 kg/m    Objective:   Physical Exam  Constitutional: She is oriented to person, place, and time. She appears well-nourished.  Appears mildly uncomfortable.  Respiratory: Effort normal. No respiratory distress.  Neurological: She is alert and oriented to person, place, and time.  Psychiatric: She has a normal mood and affect.           Assessment & Plan:

## 2019-05-09 NOTE — Assessment & Plan Note (Signed)
Bilateral flank pain x several days with radiation to groin. Given history of renal stones will work up. Labs including UA, CBC, BMP pending. Stat CT renal stone study pending. Tylenol and Ibuprofen PRN. Push water intake. Await results.

## 2019-05-09 NOTE — Patient Instructions (Signed)
Call our office to schedule a lab appointment as discussed.  You will be contacted regarding your CT scan.  Please let us know if you have not been contacted within a few hours.  I'll be in touch with your results once received.  It was a pleasure meeting you! Allie Bossier, NP-C

## 2019-05-10 ENCOUNTER — Other Ambulatory Visit: Payer: PRIVATE HEALTH INSURANCE

## 2019-07-24 ENCOUNTER — Other Ambulatory Visit: Payer: Self-pay | Admitting: Family

## 2019-07-24 DIAGNOSIS — J309 Allergic rhinitis, unspecified: Secondary | ICD-10-CM

## 2019-08-23 ENCOUNTER — Other Ambulatory Visit: Payer: Self-pay | Admitting: Family

## 2019-08-23 DIAGNOSIS — J309 Allergic rhinitis, unspecified: Secondary | ICD-10-CM

## 2019-09-22 ENCOUNTER — Other Ambulatory Visit: Payer: Self-pay | Admitting: Family

## 2019-09-22 DIAGNOSIS — F418 Other specified anxiety disorders: Secondary | ICD-10-CM

## 2019-09-22 DIAGNOSIS — I10 Essential (primary) hypertension: Secondary | ICD-10-CM

## 2019-09-22 DIAGNOSIS — J309 Allergic rhinitis, unspecified: Secondary | ICD-10-CM

## 2019-10-23 ENCOUNTER — Other Ambulatory Visit: Payer: Self-pay | Admitting: Family

## 2019-10-23 DIAGNOSIS — J309 Allergic rhinitis, unspecified: Secondary | ICD-10-CM

## 2019-11-22 ENCOUNTER — Other Ambulatory Visit: Payer: Self-pay | Admitting: Family

## 2019-11-22 DIAGNOSIS — J309 Allergic rhinitis, unspecified: Secondary | ICD-10-CM

## 2019-12-21 ENCOUNTER — Other Ambulatory Visit: Payer: Self-pay | Admitting: Family

## 2019-12-21 DIAGNOSIS — J309 Allergic rhinitis, unspecified: Secondary | ICD-10-CM

## 2020-01-20 ENCOUNTER — Other Ambulatory Visit: Payer: Self-pay | Admitting: Family

## 2020-01-20 DIAGNOSIS — J309 Allergic rhinitis, unspecified: Secondary | ICD-10-CM

## 2020-02-19 ENCOUNTER — Other Ambulatory Visit: Payer: Self-pay | Admitting: Family

## 2020-02-19 DIAGNOSIS — J309 Allergic rhinitis, unspecified: Secondary | ICD-10-CM

## 2020-03-20 ENCOUNTER — Other Ambulatory Visit: Payer: Self-pay | Admitting: Family

## 2020-03-20 DIAGNOSIS — J309 Allergic rhinitis, unspecified: Secondary | ICD-10-CM

## 2020-03-20 DIAGNOSIS — F418 Other specified anxiety disorders: Secondary | ICD-10-CM

## 2020-03-20 DIAGNOSIS — I10 Essential (primary) hypertension: Secondary | ICD-10-CM

## 2020-04-19 ENCOUNTER — Other Ambulatory Visit: Payer: Self-pay | Admitting: Family

## 2020-04-19 DIAGNOSIS — J309 Allergic rhinitis, unspecified: Secondary | ICD-10-CM

## 2020-04-21 ENCOUNTER — Other Ambulatory Visit: Payer: Self-pay

## 2020-04-21 ENCOUNTER — Encounter: Payer: Self-pay | Admitting: Family

## 2020-04-21 DIAGNOSIS — J309 Allergic rhinitis, unspecified: Secondary | ICD-10-CM

## 2020-04-21 MED ORDER — MONTELUKAST SODIUM 10 MG PO TABS: ORAL_TABLET | ORAL | 0 refills | Status: DC

## 2020-04-23 ENCOUNTER — Other Ambulatory Visit: Payer: Self-pay

## 2020-04-23 ENCOUNTER — Encounter: Payer: Self-pay | Admitting: Family

## 2020-04-23 ENCOUNTER — Ambulatory Visit
Admission: EM | Admit: 2020-04-23 | Discharge: 2020-04-23 | Disposition: A | Discharge status: Home / Self Care | Payer: BC Managed Care – PPO | Attending: Emergency Medicine | Admitting: Emergency Medicine

## 2020-04-23 DIAGNOSIS — R21 Rash and other nonspecific skin eruption: Secondary | ICD-10-CM

## 2020-04-23 MED ORDER — VALACYCLOVIR HCL 1 G PO TABS: 1000.0000 mg | ORAL_TABLET | Freq: Three times a day (TID) | ORAL | 0 refills | Status: DC

## 2020-04-23 NOTE — ED Provider Notes (Signed)
Renaldo Fiddler    CSN: [id number] Arrival date & time: 04/23/20  1721      History   Chief Complaint Chief Complaint  Patient presents with  . Rash    HPI Annette Gonzales is a 49 y.o. female.   Patient presents with a painful, mildly pruritic rash on her right shoulder, right upper back, right posterior neck x5 days.  She is concerned for shingles.  The rash started as a cluster of papules on her shoulder and has progressed to scattered papules in the areas mentioned above.  She continues to have new lesions today.  She states the older lesions have drained some scant clear fluid.  She denies fever, chills, sore throat, cough, shortness of breath, abdominal pain, or other symptoms.  Her medical history includes hypertension, asthma, diverticulitis, GERD, headaches, depression, anxiety.  The history is provided by the patient and medical records.    Past Medical History:  Diagnosis Date  . Asthma   . Depression   . Diverticulitis   . Frequent headaches   . GERD (gastroesophageal reflux disease)   . History of kidney stones   . History of stomach ulcers history of mouth ulcers  . Hyperlipidemia   . Hypertension   . Urinary tract bacterial infections     Patient Active Problem List   Diagnosis Date Noted  . Flank pain 05/09/2019  . Allergic rhinitis 09/17/2018  . Screen for colon cancer 04/03/2017  . Stress incontinence of urine 07/07/2016  . History of asthma 04/24/2015  . Depression with anxiety 04/24/2015  . Arthralgia of both knees 04/24/2015  . Diverticulosis of colon without hemorrhage 04/24/2015  . Frequent headaches 04/24/2015  . GERD (gastroesophageal reflux disease) 04/24/2015  . History of gastric ulcer 04/24/2015  . Benign essential HTN 04/24/2015  . HLD (hyperlipidemia) 04/24/2015  . History of nephrolithiasis 04/24/2015  . Overweight 04/24/2015  . Encounter to establish care 04/24/2015    Past Surgical History:  Procedure Laterality Date  .  ABDOMINAL HYSTERECTOMY  2005   Uterus only  . APPENDECTOMY  1985  . HERNIA REPAIR  2012  . LEEP    . TONSILLECTOMY    . TONSILLECTOMY AND ADENOIDECTOMY  1989    OB History   No obstetric history on file.      Home Medications    Prior to Admission medications   Medication Sig Start Date End Date Taking? Authorizing Provider  escitalopram (LEXAPRO) 10 MG tablet Take 1 tablet by mouth daily. 03/20/20  Yes Arnett, Lyn Records, FNP  lisinopril (ZESTRIL) 10 MG tablet Take 1 tablet by mouth daily. 03/20/20  Yes Arnett, Lyn Records, FNP  montelukast (SINGULAIR) 10 MG tablet Take 1 tablet (10mg  total) by mouth at bedtime. 04/21/20  Yes Arnett, 04/23/20, FNP  albuterol (PROVENTIL HFA;VENTOLIN HFA) 108 (90 Base) MCG/ACT inhaler Inhale 2 puffs into the lungs every 6 (six) hours as needed for wheezing or shortness of breath. 06/01/16   13/8/17, FNP  buPROPion (WELLBUTRIN XL) 150 MG 24 hr tablet Start 150 mg ER PO qam, increase after 3 days to 300 mg qam. 09/19/18   09/21/18, FNP  busPIRone (BUSPAR) 7.5 MG tablet Take 1 tablet (7.5 mg total) by mouth 3 (three) times daily. Patient not taking: Reported on 04/23/2020 12/21/17   12/23/17, FNP  ciprofloxacin (CIPRO) 500 MG tablet Take 1 tablet (500 mg total) by mouth 2 (two) times daily. 05/09/19   05/11/19, NP  fluticasone (  FLONASE) 50 MCG/ACT nasal spray Place 2 sprays into both nostrils daily. 01/31/19   Allegra Grana, FNP  metroNIDAZOLE (FLAGYL) 500 MG tablet Take 1 tablet (500 mg total) by mouth 3 (three) times daily. 05/09/19   Doreene Nest, NP  valACYclovir (VALTREX) 1000 MG tablet Take 1 tablet (1,000 mg total) by mouth 3 (three) times daily. 04/23/20   Mickie Bail, NP    Family History Family History  Problem Relation Age of Onset  . Arthritis Mother   . Cancer Mother        Breast Cancer  . Heart disease Mother        Mitral valve disease  . Hyperlipidemia Father   . Heart disease Father     . Stroke Father   . Hypertension Father   . Arthritis Maternal Grandmother   . Mental illness Maternal Grandmother   . Cancer Maternal Grandfather        Colon/ Prostate Cancer  . Heart disease Maternal Grandfather   . Stroke Maternal Grandfather   . Diabetes Maternal Grandfather   . Arthritis Paternal Grandmother   . Hypertension Paternal Grandmother        renal artery stenosis  . Heart attack Paternal Grandfather   . Hypertension Sister   . Heart disease Sister        tachycardia s/p ablation    Social History Social History   Tobacco Use  . Smoking status: Never Smoker  . Smokeless tobacco: Never Used  Vaping Use  . Vaping Use: Never used  Substance Use Topics  . Alcohol use: Yes    Comment: occasionally  . Drug use: No     Allergies   Sulfa antibiotics   Review of Systems Review of Systems  Constitutional: Negative for chills and fever.  HENT: Negative for ear pain and sore throat.   Eyes: Negative for pain and visual disturbance.  Respiratory: Negative for cough and shortness of breath.   Cardiovascular: Negative for chest pain and palpitations.  Gastrointestinal: Negative for abdominal pain and vomiting.  Genitourinary: Negative for dysuria and hematuria.  Musculoskeletal: Negative for arthralgias and back pain.  Skin: Positive for rash. Negative for color change.  Neurological: Negative for seizures and syncope.  All other systems reviewed and are negative.    Physical Exam Triage Vital Signs ED Triage Vitals  Enc Vitals Group     BP      Pulse      Resp      Temp      Temp src      SpO2      Weight      Height      Head Circumference      Peak Flow      Pain Score      Pain Loc      Pain Edu?      Excl. in GC?    No data found.  Updated Vital Signs BP (!) 143/90   Pulse 95   Temp 99.2 F (37.3 C)   Resp 14   SpO2 95%   Visual Acuity Right Eye Distance:   Left Eye Distance:   Bilateral Distance:    Right Eye Near:    Left Eye Near:    Bilateral Near:     Physical Exam Vitals and nursing note reviewed.  Constitutional:      General: She is not in acute distress.    Appearance: She is well-developed. She is not ill-appearing.  HENT:  Head: Normocephalic and atraumatic.     Mouth/Throat:     Mouth: Mucous membranes are moist.  Eyes:     Conjunctiva/sclera: Conjunctivae normal.  Cardiovascular:     Rate and Rhythm: Normal rate and regular rhythm.     Heart sounds: No murmur heard.   Pulmonary:     Effort: Pulmonary effort is normal. No respiratory distress.     Breath sounds: Normal breath sounds.  Abdominal:     Palpations: Abdomen is soft.     Tenderness: There is no abdominal tenderness.  Musculoskeletal:     Cervical back: Neck supple.  Skin:    General: Skin is warm and dry.     Findings: Rash present.     Comments: Cluster of crusted lesions on right shoulder with localized erythema.  Scattered papules in various stages of healing on right upper back and right posterior neck.  No drainage.  Neurological:     General: No focal deficit present.     Mental Status: She is alert and oriented to person, place, and time.     Sensory: No sensory deficit.     Motor: No weakness.     Gait: Gait normal.  Psychiatric:        Mood and Affect: Mood normal.        Behavior: Behavior normal.      UC Treatments / Results  Labs (all labs ordered are listed, but only abnormal results are displayed) Labs Reviewed - No data to display  EKG   Radiology No results found.  Procedures Procedures (including critical care time)  Medications Ordered in UC Medications - No data to display  Initial Impression / Assessment and Plan / UC Course  I have reviewed the triage vital signs and the nursing notes.  Pertinent labs & imaging results that were available during my care of the patient were reviewed by me and considered in my medical decision making (see chart for details).   Rash;  possibly shingles.  Although patient is outside the 72-hour window, she continues to have new lesions; therefore treating with Valtrex.  Education provided on shingles.  Instructed her to follow-up with her PCP if her symptoms are not improving.  Patient agrees to plan of care.   Final Clinical Impressions(s) / UC Diagnoses   Final diagnoses:  Rash     Discharge Instructions     Take the Valtrex as directed.  See the attached information on Shingles.    Follow up with your primary care provider if your symptoms are not improving.       ED Prescriptions    Medication Sig Dispense Auth. Provider   valACYclovir (VALTREX) 1000 MG tablet Take 1 tablet (1,000 mg total) by mouth 3 (three) times daily. 21 tablet Mickie Bail, NP     I have reviewed the PDMP during this encounter.   Mickie Bail, NP 04/23/20 1757

## 2020-04-23 NOTE — Telephone Encounter (Signed)
Spoken to patient and informed her that there are no available appointments and if she does have shingles she will need to be seen before Monday. Suggested UC due to her SX. She stated that she has a cluster on her shoulder that is very painful, it is not affecting her head and neck. She is now unable to move her head without pain due to the rash. She is also having some upper body joint pain. No fever, chills, nausea, and vomiting.

## 2020-04-23 NOTE — ED Triage Notes (Signed)
Patient reports she noticed a small red spot on her right shoulder over the weekend. Reports the area has continued to grow and become increasingly itchy and painful. Patient believes it to possibly be shingles.

## 2020-04-23 NOTE — Discharge Instructions (Addendum)
Take the Valtrex as directed.  See the attached information on Shingles.    Follow up with your primary care provider if your symptoms are not improving.

## 2020-04-27 ENCOUNTER — Other Ambulatory Visit: Payer: Self-pay

## 2020-04-27 ENCOUNTER — Encounter: Payer: Self-pay | Admitting: Nurse Practitioner

## 2020-04-27 ENCOUNTER — Telehealth (INDEPENDENT_AMBULATORY_CARE_PROVIDER_SITE_OTHER): Payer: BC Managed Care – PPO | Admitting: Nurse Practitioner

## 2020-04-27 VITALS — Ht 62.0 in | Wt 147.0 lb

## 2020-04-27 DIAGNOSIS — B029 Zoster without complications: Secondary | ICD-10-CM

## 2020-04-27 MED ORDER — GABAPENTIN 100 MG PO CAPS: 100.0000 mg | ORAL_CAPSULE | Freq: Three times a day (TID) | ORAL | 0 refills | Status: DC | PRN

## 2020-04-27 NOTE — Progress Notes (Signed)
Virtual Visit via Video Note  This visit type was conducted due to national recommendations for restrictions regarding the COVID-19 pandemic (e.g. social distancing).  This format is felt to be most appropriate for this patient at this time.  All issues noted in this document were discussed and addressed.  No physical exam was performed (except for noted visual exam findings with Video Visits).   I connected with@ on 04/27/20 at  2:00 PM EDT by a video enabled telemedicine application or telephone and verified that I am speaking with the correct person using two identifiers. Location patient: home Location provider: work or home office Persons participating in the virtual visit: patient, provider  I discussed the limitations, risks, security and privacy concerns of performing an evaluation and management service by telephone and the availability of in person appointments. I also discussed with the patient that there may be a patient responsible charge related to this service. The patient expressed understanding and agreed to proceed.  Reason for visit: Shingles follow up after Urgent Care   HPI: This 49 year old with history of asthma, depression anxiety, gastric ulcer, reports new diagnosis of shingles on Monday.  This is the first episode of shingles.  She felt pain, and saw red dots on her right shoulder on Monday.  By Wednesday, she had a headache, neck ache, and right shoulder blisters with clear vesicles. She has a few red spots now on her posterior neck and hairline.  She was seen in the acute care diagnosed with a likely shingles and prescribed Valtrex 1 g 3 times a day for 7 days.  She started Valtrex last Thursday. Since then, she has less headache, and the vesicles seem to be spreading less, and drying up.  She still has a lot  pain in the right neck shoulder area despite regular Advil use and is finding it difficult to sleep.  She has not tried Tylenol. She is finding it difficult to  concentrate working at home and is requesting a work note.   ROS: See pertinent positives and negatives per HPI.  Past Medical History:  Diagnosis Date  . Asthma   . Depression   . Diverticulitis   . Frequent headaches   . GERD (gastroesophageal reflux disease)   . History of kidney stones   . History of stomach ulcers history of mouth ulcers  . Hyperlipidemia   . Hypertension   . Urinary tract bacterial infections     Past Surgical History:  Procedure Laterality Date  . ABDOMINAL HYSTERECTOMY  2005   Uterus only  . APPENDECTOMY  1985  . HERNIA REPAIR  2012  . LEEP    . TONSILLECTOMY    . TONSILLECTOMY AND ADENOIDECTOMY  1989    Family History  Problem Relation Age of Onset  . Arthritis Mother   . Cancer Mother        Breast Cancer  . Heart disease Mother        Mitral valve disease  . Hyperlipidemia Father   . Heart disease Father   . Stroke Father   . Hypertension Father   . Arthritis Maternal Grandmother   . Mental illness Maternal Grandmother   . Cancer Maternal Grandfather        Colon/ Prostate Cancer  . Heart disease Maternal Grandfather   . Stroke Maternal Grandfather   . Diabetes Maternal Grandfather   . Arthritis Paternal Grandmother   . Hypertension Paternal Grandmother        renal artery stenosis  .  Heart attack Paternal Grandfather   . Hypertension Sister   . Heart disease Sister        tachycardia s/p ablation    SOCIAL HX: Never smoked   Current Outpatient Medications:  .  albuterol (PROVENTIL HFA;VENTOLIN HFA) 108 (90 Base) MCG/ACT inhaler, Inhale 2 puffs into the lungs every 6 (six) hours as needed for wheezing or shortness of breath., Disp: 1 Inhaler, Rfl: 0 .  buPROPion (WELLBUTRIN XL) 150 MG 24 hr tablet, Start 150 mg ER PO qam, increase after 3 days to 300 mg qam., Disp: 60 tablet, Rfl: 3 .  busPIRone (BUSPAR) 7.5 MG tablet, Take 1 tablet (7.5 mg total) by mouth 3 (three) times daily., Disp: 270 tablet, Rfl: 0 .  escitalopram  (LEXAPRO) 10 MG tablet, Take 1 tablet by mouth daily., Disp: 90 tablet, Rfl: 0 .  fluticasone (FLONASE) 50 MCG/ACT nasal spray, Place 2 sprays into both nostrils daily., Disp: 16 g, Rfl: 2 .  lisinopril (ZESTRIL) 10 MG tablet, Take 1 tablet by mouth daily., Disp: 90 tablet, Rfl: 0 .  metroNIDAZOLE (FLAGYL) 500 MG tablet, Take 1 tablet (500 mg total) by mouth 3 (three) times daily., Disp: 30 tablet, Rfl: 0 .  montelukast (SINGULAIR) 10 MG tablet, Take 1 tablet (10mg  total) by mouth at bedtime., Disp: 30 tablet, Rfl: 0 .  valACYclovir (VALTREX) 1000 MG tablet, Take 1 tablet (1,000 mg total) by mouth 3 (three) times daily., Disp: 21 tablet, Rfl: 0 .  ciprofloxacin (CIPRO) 500 MG tablet, Take 1 tablet (500 mg total) by mouth 2 (two) times daily., Disp: 20 tablet, Rfl: 0  EXAM:  VITALS per patient if applicable:  GENERAL: alert, oriented, appears well and in no acute distress  HEENT: atraumatic, conjunctiva clear, no obvious abnormalities on inspection of external nose and ears  NECK: normal movements of the head and neck  LUNGS: on inspection no signs of respiratory distress, breathing rate appears normal, no obvious gross SOB, gasping or wheezing  CV: no obvious cyanosis  MS: moves all visible extremities without noticeable abnormality  SKIN: Right shoulder with red sore looking lesion, clustered in one area, no erythema or sings of cellulitis. A few red, flat looking rash lesions noted t=right posterior neck. These are not raised and ave no vesicles. No rash on forehead or anywhere on the face, nose or near the eye.    PSYCH/NEURO: pleasant and cooperative, no obvious depression or anxiety, speech and thought processing grossly intact  ASSESSMENT AND PLAN:  Discussed the following assessment and plan:  Herpes zoster without complication  No problem-specific Assessment & Plan notes found for this encounter.  Advised:  For your shingles pain on your right shoulder, right neck and  into the right scalp hairline continue the Valtrex and finish that therapy.    For continued pain, you may take the gabapentin 100 mg 3 times daily as needed starting today.  If it makes you drowsy, you may take it before bedtime.    Gabapentin dose can be adjusted so, if taken at bedtime-start with 100 mg and after 2-3 days may increase to 200 mg at bedtime if need more  pain relief.  If still need more pain relief after 2-3 days may go up to 300 mg at bedtime. Please let me know if this does not help and I can make further recommendations.  You may also take Tylenol. I would prefer you to avoid Advil with history of stomach ulcers noted in your medical history.   If  your symptoms worsen, you will need to be seen in the acute care for in person evaluation.  I would recommend [facility] or [facility] walk-in clinic.   Monitor the skin rash for surrounding pinkness, warmth, or any pussy type discharge and if any 1 of these start to develop, let us know.  This would suggest a secondary bacterial skin infection which would require an antibiotic.  Monitor the herpes rash and if it starts to spread to the forehead or the eye or nose area.  If you see that the herpes lesions are starting into the right forehead you need to let me know and I will advise that you have an eye doctor examine you ASAP.  Herpes in the eye area can cause significant eye problems including blindness so this needs to be monitored closely.  You already realized this per our discussion and after watching a family member go through this.  Patient advised to wear a mask and keep the rash covered and wash hands off to prevent the spread of virus to others.  She was advised to avoid contact with pregnant women who have never had chickenpox vaccine, babies, immune compromised individuals.  She may have a work note for 1 week.  Follow-up video visit in 1 week.  The patient was advised to call back or seek an in-person  evaluation if the symptoms worsen or if the condition fails to improve as anticipated.  Amedeo Kinsman, NP Adult Nurse Practitioner [facility] [employer] [phone]

## 2020-04-27 NOTE — Patient Instructions (Addendum)
For your shingles pain on your right shoulder, right neck and into the right scalp hairline continue the Valtrex and finish that therapy.    For continued pain, you may take the gabapentin 100 mg 3 times daily as needed starting today.  If it makes you drowsy, you may take it before bedtime.    Gabapentin dose can be adjusted so, if taken at bedtime-start with 100 mg and after 2-3 days may increase to 200 mg at bedtime if need more  pain relief.  If still need more pain relief after 2-3 days may go up to 300 mg at bedtime. Please let me know if this does not help and I can make further recommendations.  You may also take Tylenol. I would prefer you to avoid Advil with history of stomach ulcers noted in your medical history.    Please make another video visit in 1 week with either myself or Claris Che for further evaluation.   If your symptoms worsen, you will need to be seen in the acute care for in person evaluation.  I would recommend [facility] or [facility] walk-in clinic.   Monitor the skin rash for surrounding pinkness, warmth, or any pussy type discharge and if any 1 of these start to develop, let us know.  This would suggest a secondary bacterial skin infection which would require an antibiotic.  Monitor the herpes rash and if it starts to spread to the forehead or the eye or nose area.  If you see that the herpes lesions are starting into the right forehead you need to let me know and I will advise that you have an eye doctor examine you ASAP.  Herpes in the eye area can cause significant eye problems including blindness so this needs to be monitored closely.  You already realized this per our discussion and after watching a family member go through this.  Please wear a mask and keep the rash covered and wash hands off to prevent the spread of virus to others.  Avoid contact with pregnant women who have never had chickenpox vaccine, babies, and immune  compromised  individuals.  Provided  work note for 1 week- sent through My Chart.  Please make a follow-up video visit in 1 week.

## 2020-04-29 ENCOUNTER — Telehealth: Payer: Self-pay | Admitting: Family

## 2020-04-29 ENCOUNTER — Encounter: Payer: Self-pay | Admitting: Nurse Practitioner

## 2020-04-29 NOTE — Telephone Encounter (Signed)
Pt called she is doing better and wanted a new return to work letter to be able to go back sooner  Pt was seen by Arvilla Market on 10/4

## 2020-04-29 NOTE — Telephone Encounter (Signed)
Work note has been done and patient is aware

## 2020-04-29 NOTE — Telephone Encounter (Signed)
Patient sent mychart message asking the same question. Sent message to Selena Batten to verify she is okay with patient going back to work sooner

## 2020-05-18 ENCOUNTER — Telehealth (INDEPENDENT_AMBULATORY_CARE_PROVIDER_SITE_OTHER): Payer: BC Managed Care – PPO | Admitting: Family

## 2020-05-18 ENCOUNTER — Encounter: Payer: Self-pay | Admitting: Family

## 2020-05-18 ENCOUNTER — Other Ambulatory Visit: Payer: Self-pay

## 2020-05-18 DIAGNOSIS — I10 Essential (primary) hypertension: Secondary | ICD-10-CM | POA: Diagnosis not present

## 2020-05-18 DIAGNOSIS — Z1231 Encounter for screening mammogram for malignant neoplasm of breast: Secondary | ICD-10-CM

## 2020-05-18 DIAGNOSIS — Z1159 Encounter for screening for other viral diseases: Secondary | ICD-10-CM

## 2020-05-18 DIAGNOSIS — B029 Zoster without complications: Secondary | ICD-10-CM

## 2020-05-18 DIAGNOSIS — F418 Other specified anxiety disorders: Secondary | ICD-10-CM | POA: Diagnosis not present

## 2020-05-18 DIAGNOSIS — Z114 Encounter for screening for human immunodeficiency virus [HIV]: Secondary | ICD-10-CM

## 2020-05-18 DIAGNOSIS — M25511 Pain in right shoulder: Secondary | ICD-10-CM

## 2020-05-18 MED ORDER — ESCITALOPRAM OXALATE 10 MG PO TABS: 10.0000 mg | ORAL_TABLET | Freq: Every day | ORAL | 1 refills | Status: DC

## 2020-05-18 MED ORDER — ESCITALOPRAM OXALATE 5 MG PO TABS: 5.0000 mg | ORAL_TABLET | Freq: Every day | ORAL | 1 refills | Status: DC

## 2020-05-18 MED ORDER — LISINOPRIL 10 MG PO TABS: 10.0000 mg | ORAL_TABLET | Freq: Every day | ORAL | 1 refills | Status: DC

## 2020-05-18 MED ORDER — BUSPIRONE HCL 7.5 MG PO TABS: 7.5000 mg | ORAL_TABLET | Freq: Three times a day (TID) | ORAL | 0 refills | Status: DC

## 2020-05-18 NOTE — Addendum Note (Signed)
Addended by: Warden Fillers on: 05/18/2020 08:59 AM   Modules accepted: Orders

## 2020-05-18 NOTE — Addendum Note (Signed)
Addended by: Warden Fillers on: 05/18/2020 09:00 AM   Modules accepted: Orders

## 2020-05-18 NOTE — Assessment & Plan Note (Addendum)
Unsure if controlled. Advised patient to send me blood pressure readings from home and come in person for follow up as well as bring her blood pressure monitor from home

## 2020-05-18 NOTE — Assessment & Plan Note (Signed)
Near resolved. Continues to have pain. Advised to trial capsaicin OTC and continue gabapentin 100mg   TID prn.

## 2020-05-18 NOTE — Progress Notes (Signed)
Virtual Visit via Video Note  I connected with@  on 05/18/20 at  8:00 AM EDT by a video enabled telemedicine application and verified that I am speaking with the correct person using two identifiers.  Location patient: home Location provider:work  Persons participating in the virtual visit: patient, provider  I discussed the limitations of evaluation and management by telemedicine and the availability of in person appointments. The patient expressed understanding and agreed to proceed.   HPI:   Complains of right shoulder pain x 8 weeks, worsening. Started prior to shingles. Painful to raise arm, pull pants up. No swelling. Concerned it is her her rotator cuff.   Recent shingles outbreak on right arm 3 weeks ago, improved. Completed valtrex. Continues to have 'deep ache' under right arm. "Feels like nerve pain'. No new lesions. Lesions are scabbed over. Using gabapentin 100mg  most one tablet at night prn with relief.   Depression and anxiety- feels well on lexapro. Very rarely takes buspar. Increased anxiety due to work stress in .  No si/hi  HTN- checks at home periodically. Doesn't recall blood pressure readings. No cp, ha, vision changes.     ROS: See pertinent positives and negatives per HPI.    EXAM:  VITALS per patient if applicable: There were no vitals taken for this visit. BP Readings from Last 3 Encounters:  04/23/20 (!) 143/90  09/17/18 98/64  04/03/17 130/80   Wt Readings from Last 3 Encounters:  04/27/20 147 lb (66.7 kg)  05/09/19 147 lb 8 oz (66.9 kg)  09/17/18 147 lb 9.6 oz (67 kg)    GENERAL: alert, oriented, appears well and in no acute distress  HEENT: atraumatic, conjunttiva clear, no obvious abnormalities on inspection of external nose and ears  NECK: normal movements of the head and neck  LUNGS: on inspection no signs of respiratory distress, breathing rate appears normal, no obvious gross SOB, gasping or wheezing  CV: no obvious  cyanosis  MS: moves all visible extremities without noticeable abnormality  PSYCH/NEURO: pleasant and cooperative, no obvious depression or anxiety, speech and thought processing grossly intact    ASSESSMENT AND PLAN:  Discussed the following assessment and plan:  Problem List Items Addressed This Visit      Cardiovascular and Mediastinum   Benign essential HTN - Primary    Unsure if controlled. Advised patient to send me blood pressure readings from home and come in person for follow up as well as bring her blood pressure monitor from home      Relevant Orders   TSH   CBC with Differential/Platelet   Comprehensive metabolic panel   Hemoglobin A1c   Lipid panel   VITAMIN D 25 Hydroxy (Vit-D Deficiency, Fractures)   Hepatitis C antibody   HIV Antibody (routine testing w rflx)     Other   Depression with anxiety    Some breakthrough anxiety. Increase lexapro to 15mg  qd. Continue buspar 15mg   TID.      Relevant Medications   escitalopram (LEXAPRO) 5 MG tablet   Herpes zoster without complication    Near resolved. Continues to have pain. Advised to trial capsaicin OTC and continue gabapentin 100mg   TID prn.      Shoulder pain    Right shoulder pain. Describes decreased in ROM. We agreed to consult orthopedics. Referral placed.       Relevant Orders   Ambulatory referral to Orthopedic Surgery    Other Visit Diagnoses    Encounter for screening mammogram for malignant neoplasm  of breast       Relevant Orders   MM 3D SCREEN BREAST BILATERAL   Encounter for hepatitis C screening test for low risk patient       Relevant Orders   Hepatitis C antibody   Screening for HIV (human immunodeficiency virus)       Relevant Orders   HIV Antibody (routine testing w rflx)      -we discussed possible serious and likely etiologies, options for evaluation and workup, limitations of telemedicine visit vs in person visit, treatment, treatment risks and precautions. Pt prefers to  treat via telemedicine empirically rather then risking or undertaking an in person visit at this moment.  .   I discussed the assessment and treatment plan with the patient. The patient was provided an opportunity to ask questions and all were answered. The patient agreed with the plan and demonstrated an understanding of the instructions.   The patient was advised to call back or seek an in-person evaluation if the symptoms worsen or if the condition fails to improve as anticipated.   Rennie Plowman, FNP

## 2020-05-18 NOTE — Assessment & Plan Note (Signed)
Right shoulder pain. Describes decreased in ROM. We agreed to consult orthopedics. Referral placed.

## 2020-05-18 NOTE — Assessment & Plan Note (Signed)
Some breakthrough anxiety. Increase lexapro to 15mg  qd. Continue buspar 15mg   TID.

## 2020-05-18 NOTE — Patient Instructions (Addendum)
It is imperative that you are seen AT least twice per year for labs and monitoring. Monitor blood pressure at home and me 5-6 reading on separate days. Goal is less than 120/80, based on newest guidelines, however we certainly want to be less than 130/80;  if persistently higher, please make sooner follow up appointment so we can recheck you blood pressure and manage/ adjust medications.  Please call  and schedule your 3D mammogram as discussed at [facility].  Referral to orthopedics. Let us know if you dont hear back within a week in regards to an appointment being scheduled.   Increase lexapro 15mg  once per day. Let me know how you are doing.

## 2020-05-19 ENCOUNTER — Other Ambulatory Visit: Payer: Self-pay | Admitting: Family

## 2020-05-19 DIAGNOSIS — J309 Allergic rhinitis, unspecified: Secondary | ICD-10-CM

## 2020-05-29 ENCOUNTER — Ambulatory Visit: Payer: Self-pay

## 2020-05-29 ENCOUNTER — Ambulatory Visit (INDEPENDENT_AMBULATORY_CARE_PROVIDER_SITE_OTHER): Payer: BC Managed Care – PPO | Admitting: Orthopedic Surgery

## 2020-05-29 ENCOUNTER — Encounter: Payer: Self-pay | Admitting: Orthopedic Surgery

## 2020-05-29 VITALS — Ht 62.0 in | Wt 147.0 lb

## 2020-05-29 DIAGNOSIS — M25511 Pain in right shoulder: Secondary | ICD-10-CM | POA: Diagnosis not present

## 2020-05-29 MED ORDER — HYDROCODONE-ACETAMINOPHEN 5-325 MG PO TABS: 1.0000 | ORAL_TABLET | Freq: Four times a day (QID) | ORAL | 0 refills | Status: DC | PRN

## 2020-05-29 NOTE — Progress Notes (Signed)
Subjective: Patient is here for ultrasound-guided intra-articular right glenohumeral injection.  Adhesive capsulitis secondary to injury while attempting to start a pressure washer.  Objective: Very limited range of motion of the right shoulder with pain at the extremes.  Procedure: Ultrasound guided injection is preferred based studies that show increased duration, increased effect, greater accuracy, decreased procedural pain, increased response rate, and decreased cost with ultrasound guided versus blind injection.   Verbal informed consent obtained.  Time-out conducted.  Noted no overlying erythema, induration, or other signs of local infection. Ultrasound-guided right glenohumeral injection: After sterile prep with Betadine, injected 8 cc 1% lidocaine without epinephrine and 6 mg betamethasone using a 22-gauge spinal needle, passing the needle from posterior approach into the glenohumeral joint.  Injectate seen filling the joint capsule.

## 2020-05-31 ENCOUNTER — Encounter: Payer: Self-pay | Admitting: Orthopedic Surgery

## 2020-05-31 NOTE — Progress Notes (Signed)
Office Visit Note   Patient: Annette Gonzales           Date of Birth: September 26, 1970           MRN: [id number] Visit Date: 05/29/2020 Requested by: Allegra Grana, FNP [address] [place],  [place] [address] PCP: Allegra Grana, FNP  Subjective: Chief Complaint  Patient presents with  . Right Shoulder - Pain    HPI: Annette Gonzales is a 49 year old female with right shoulder pain. She had a possible injury in July starting the pressure washer. It did improve but then she developed shingles which did affect her superior shoulder region. The pain is much worse even though she has gotten over her shingles. Affects her activities of daily living. She is right-hand dominant. She took gabapentin for shingles which helped at first but now it is not helping. She reports a dull ache in the shoulder at all times. It is a grabbing type pain at its worst which does happen on a daily basis. She sleeps with a heating pad. Has occasional neck pain but no radicular symptoms. No new mechanical symptoms. She does report decreased range of motion on the right-hand side in terms of putting on her close. Advil has not been helpful. 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 Has not been able to put her bra on for the past 4 weeks.              ROS: All systems reviewed are negative as they relate to the chief complaint within the history of present illness.  Patient denies  fevers or chills.   Assessment & Plan: Visit Diagnoses:  1. Acute pain of right shoulder     Plan: Impression is right shoulder adhesive capsulitis. Plan is intra-articular joint injection with home exercise program and anti-inflammatories. Come back in 6 weeks for clinical recheck and decision for or against second injection and/or formal physical therapy at that time. At this point her symptoms have been going on for likely 2 to 3 months which is amenable to early intervention.  Follow-Up  Instructions: Return in about 6 weeks (around 07/10/2020).   Orders:  Orders Placed This Encounter  Procedures  . XR Shoulder Right  . US Guided Needle Placement - No Linked Charges   Meds ordered this encounter  Medications  . HYDROcodone-acetaminophen (NORCO/VICODIN) 5-325 MG tablet    Sig: Take 1 tablet by mouth every 6 (six) hours as needed for moderate pain.    Dispense:  20 tablet    Refill:  0      Procedures: No procedures performed   Clinical Data: No additional findings.  Objective: Vital Signs: Ht 5\' 2"  (1.575 m)   Wt 147 lb (66.7 kg)   BMI 26.89 kg/m   Physical Exam:   Constitutional: Patient appears well-developed HEENT:  Head: Normocephalic Eyes:EOM are normal Neck: Normal range of motion Cardiovascular: Normal rate Pulmonary/chest: Effort normal Neurologic: Patient is alert Skin: Skin is warm Psychiatric: Patient has normal mood and affect    Ortho Exam: Ortho exam demonstrates good rotator cuff strength on the right left-hand side infraspinatus supra and subscap muscle testing. She has about 70 degrees of external rotation on the left and about 45 degrees on the right passively. Forward flexion is about 180 on the left 150 on the right. Isolated glenohumeral abduction is about 85 on the right compared to 110 on the left. No coarse grinding or crepitus on  the right-hand side. No AC joint tenderness to direct palpation.  Specialty Comments:  No specialty comments available.  Imaging: No results found.   PMFS History: Patient Active Problem List   Diagnosis Date Noted  . Herpes zoster without complication 04/27/2020  . Flank pain 05/09/2019  . Allergic rhinitis 09/17/2018  . Screen for colon cancer 04/03/2017  . Stress incontinence of urine 07/07/2016  . Shoulder pain 06/20/2016  . History of asthma 04/24/2015  . Depression with anxiety 04/24/2015  . Arthralgia of both knees 04/24/2015  . Diverticulosis of colon without hemorrhage  04/24/2015  . Frequent headaches 04/24/2015  . GERD (gastroesophageal reflux disease) 04/24/2015  . History of gastric ulcer 04/24/2015  . Benign essential HTN 04/24/2015  . HLD (hyperlipidemia) 04/24/2015  . History of nephrolithiasis 04/24/2015  . Overweight 04/24/2015  . Encounter to establish care 04/24/2015   Past Medical History:  Diagnosis Date  . Asthma   . Depression   . Diverticulitis   . Frequent headaches   . GERD (gastroesophageal reflux disease)   . History of kidney stones   . History of stomach ulcers history of mouth ulcers  . Hyperlipidemia   . Hypertension   . Urinary tract bacterial infections     Family History  Problem Relation Age of Onset  . Arthritis Mother   . Cancer Mother        Breast Cancer  . Heart disease Mother        Mitral valve disease  . Hyperlipidemia Father   . Heart disease Father   . Stroke Father   . Hypertension Father   . Arthritis Maternal Grandmother   . Mental illness Maternal Grandmother   . Cancer Maternal Grandfather        Colon/ Prostate Cancer  . Heart disease Maternal Grandfather   . Stroke Maternal Grandfather   . Diabetes Maternal Grandfather   . Arthritis Paternal Grandmother   . Hypertension Paternal Grandmother        renal artery stenosis  . Heart attack Paternal Grandfather   . Hypertension Sister   . Heart disease Sister        tachycardia s/p ablation    Past Surgical History:  Procedure Laterality Date  . ABDOMINAL HYSTERECTOMY  2005   Uterus only  . APPENDECTOMY  1985  . HERNIA REPAIR  2012  . LEEP    . TONSILLECTOMY    . TONSILLECTOMY AND ADENOIDECTOMY  1989   Social History   Occupational History  . Not on file  Tobacco Use  . Smoking status: Never Smoker  . Smokeless tobacco: Never Used  Vaping Use  . Vaping Use: Never used  Substance and Sexual Activity  . Alcohol use: Yes    Comment: occasionally  . Drug use: No  . Sexual activity: Not on file

## 2020-06-21 ENCOUNTER — Other Ambulatory Visit: Payer: Self-pay | Admitting: Family

## 2020-06-21 DIAGNOSIS — J309 Allergic rhinitis, unspecified: Secondary | ICD-10-CM

## 2020-06-24 ENCOUNTER — Encounter: Payer: Self-pay | Admitting: Family

## 2020-07-18 ENCOUNTER — Other Ambulatory Visit: Payer: Self-pay | Admitting: Family

## 2020-07-18 DIAGNOSIS — J309 Allergic rhinitis, unspecified: Secondary | ICD-10-CM

## 2020-07-30 ENCOUNTER — Telehealth: Payer: Self-pay | Admitting: Orthopedic Surgery

## 2020-07-30 NOTE — Telephone Encounter (Signed)
Called pt 1X to set appt for cortisone injection in shoulder. Please verify which shoulder for Dr. August Saucer

## 2020-08-07 ENCOUNTER — Telehealth: Payer: Self-pay | Admitting: Orthopedic Surgery

## 2020-08-07 NOTE — Telephone Encounter (Signed)
Called left 1X left vm for pt to call and set appt with Dr. August Saucer for cortisone shoulder injection. Will try again later

## 2020-08-11 ENCOUNTER — Telehealth: Payer: Self-pay | Admitting: Orthopedic Surgery

## 2020-08-11 NOTE — Telephone Encounter (Signed)
Called and left pt several message to call to set appt with Dr. August Saucer to set appt for cortisone shout in shoulder. Unsure of what shoulder.

## 2020-08-19 ENCOUNTER — Other Ambulatory Visit: Payer: Self-pay | Admitting: Family

## 2020-08-19 DIAGNOSIS — F418 Other specified anxiety disorders: Secondary | ICD-10-CM

## 2020-08-19 DIAGNOSIS — J309 Allergic rhinitis, unspecified: Secondary | ICD-10-CM

## 2020-09-20 IMAGING — CT CT RENAL STONE PROTOCOL
2 of 4 series · 16 of 46 positions shown, 18 images · non-contrast
Comparison: 05/16/2015

CLINICAL DATA: Flank and low back pain 3 days. Low-grade fever.
Nephrolithiasis.

EXAM:
CT ABDOMEN AND PELVIS WITHOUT CONTRAST
TECHNIQUE: Multidetector CT imaging of the abdomen and pelvis was performed
following the standard protocol without IV contrast.

[Series 2: stone full standard · axial · 0.64mm/px · z∈[-455,-30]mm · 13 of 93 slices shown, 15 images]
[im 4/93  soft-tissue]
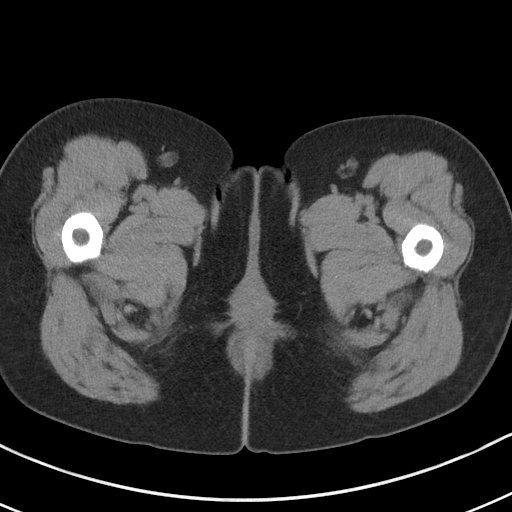
[im 4/93  bone]
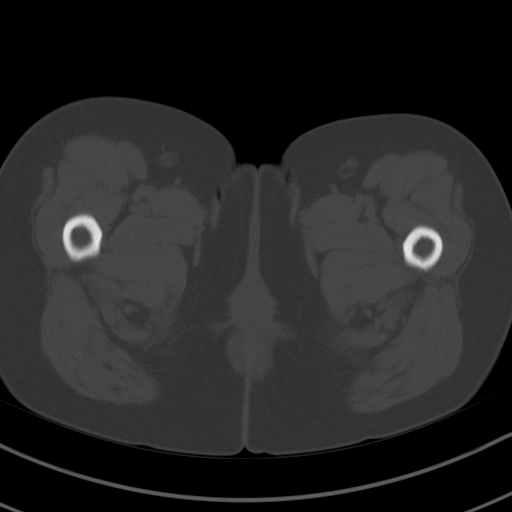
[im 12/93  soft-tissue]
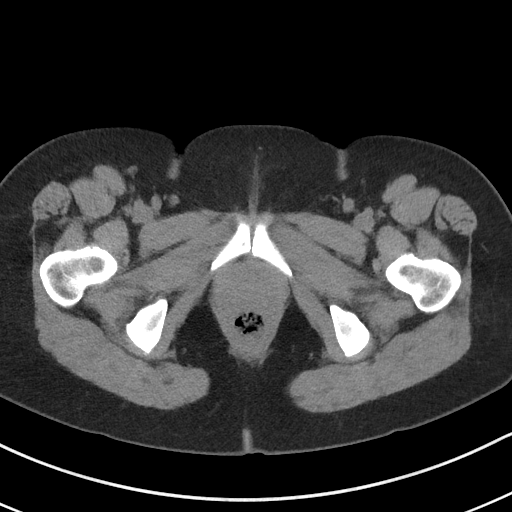
[im 20/93  soft-tissue]
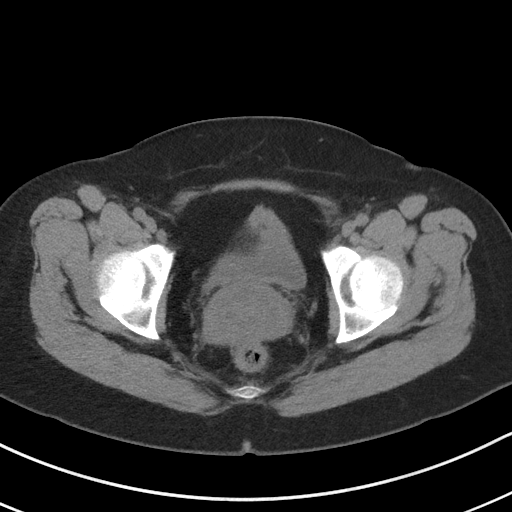
[im 27/93  soft-tissue]
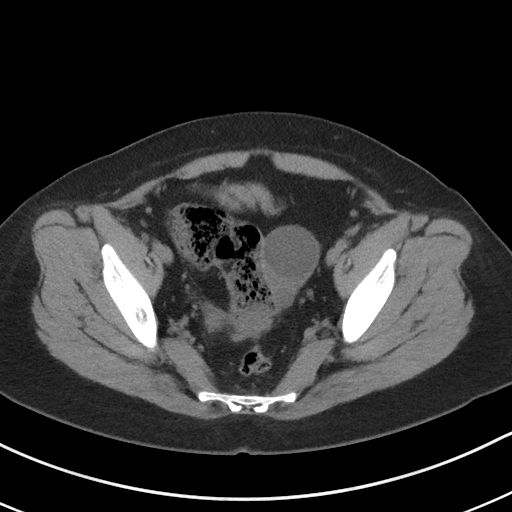
[im 31/93  soft-tissue]
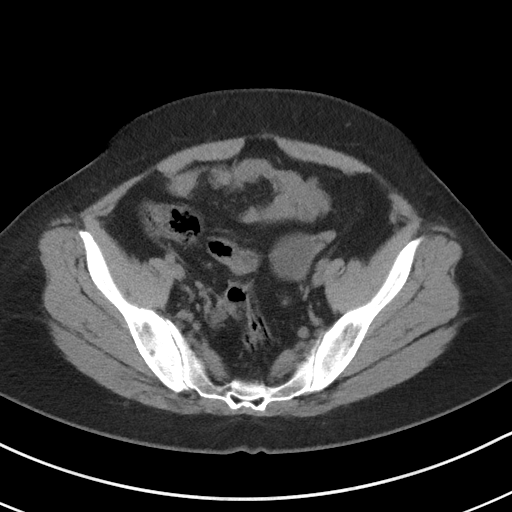
[im 39/93  soft-tissue]
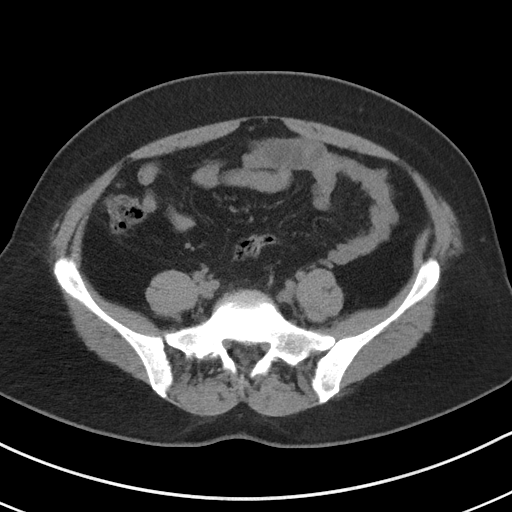
[im 47/93  soft-tissue]
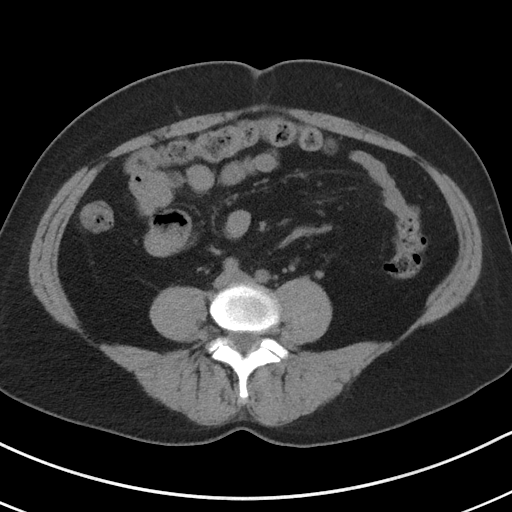
[im 54/93  soft-tissue]
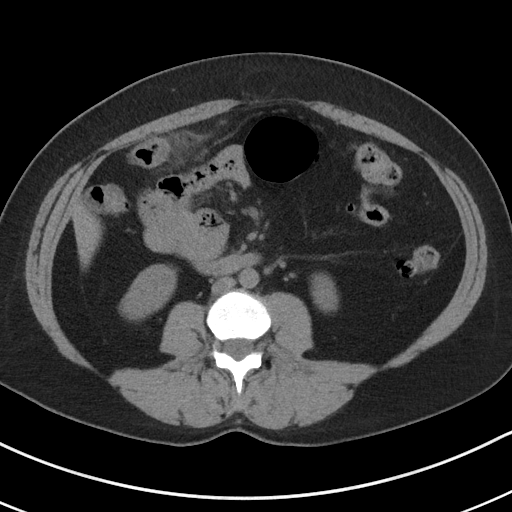
[im 62/93  soft-tissue]
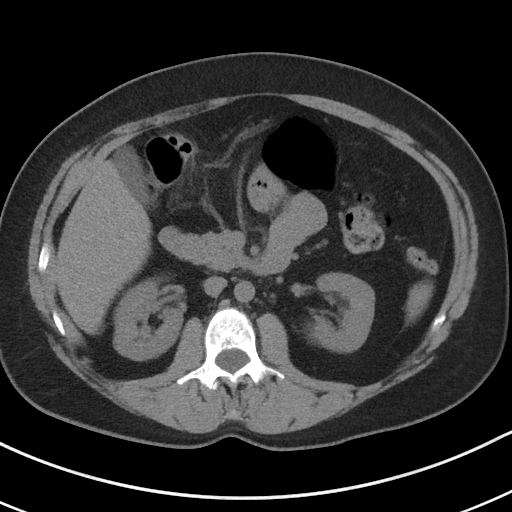
[im 62/93  bone]
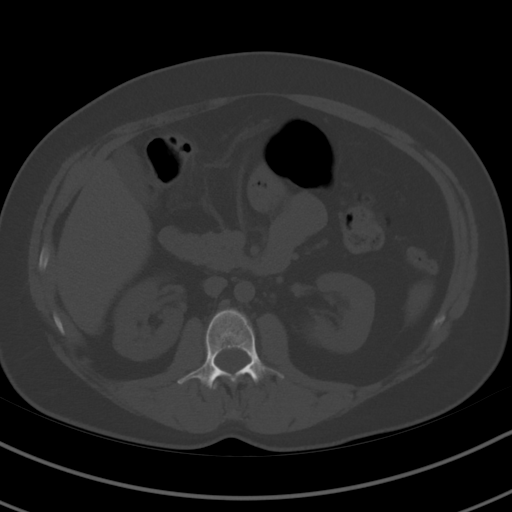
[im 66/93  soft-tissue]
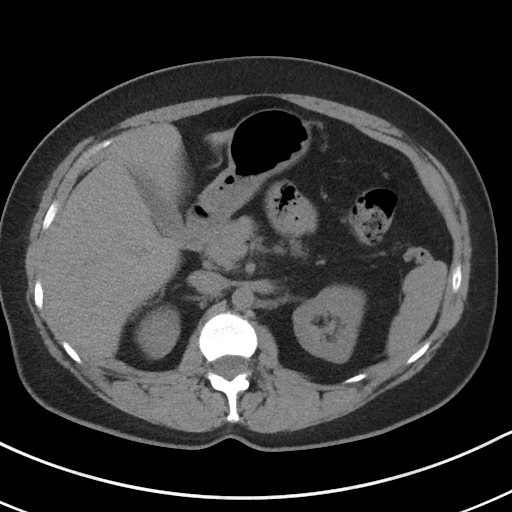
[im 73/93  soft-tissue]
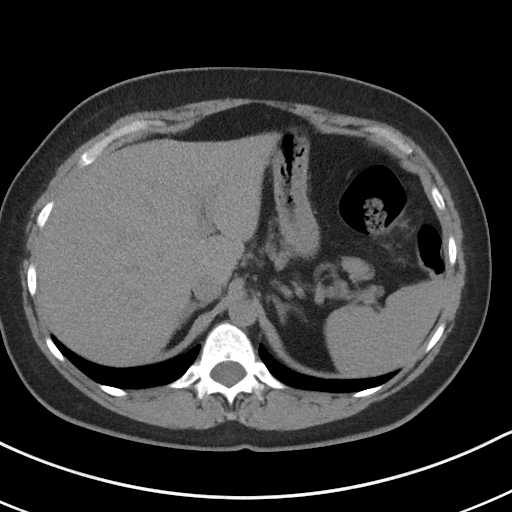
[im 81/93  soft-tissue]
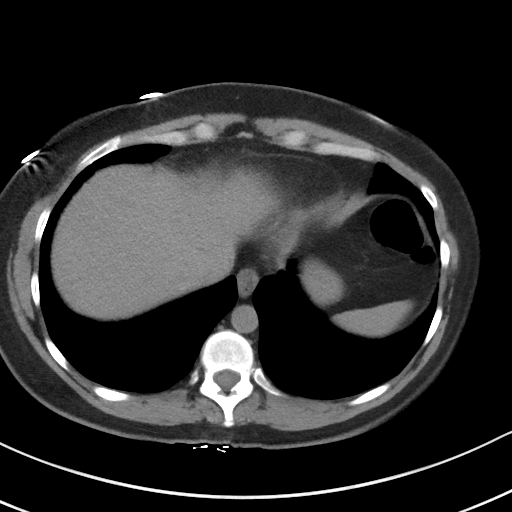
[im 89/93  soft-tissue]
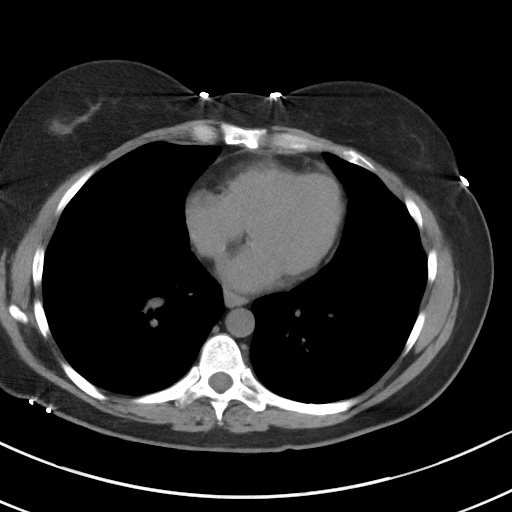

[Series 5: coronal · coronal · 0.76mm/px · 3 of 121 slices shown]
[im 41/121  soft-tissue]
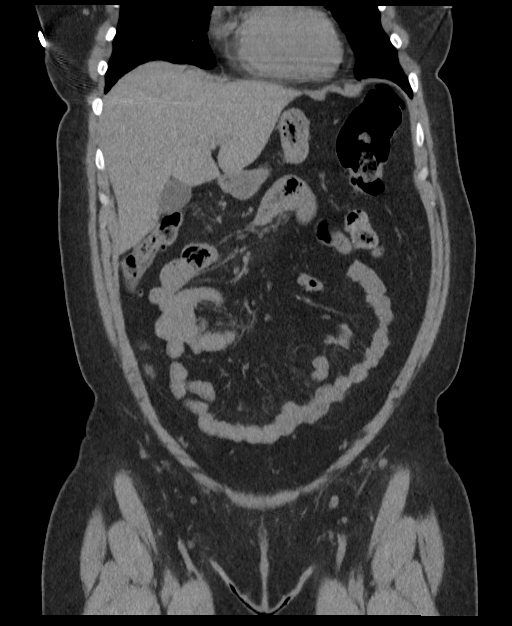
[im 54/121  soft-tissue]
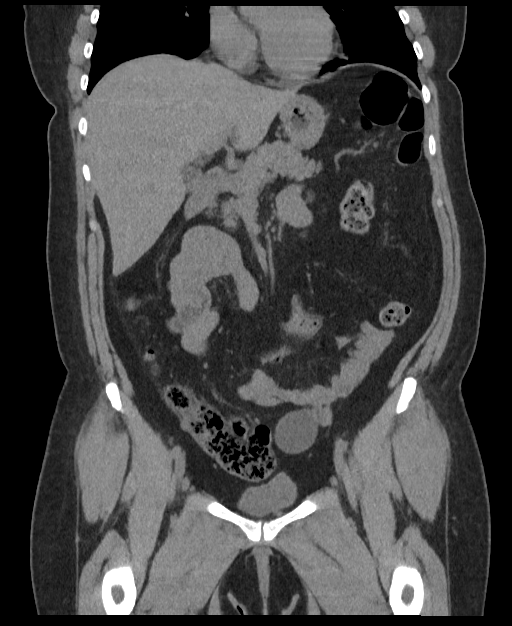
[im 67/121  soft-tissue]
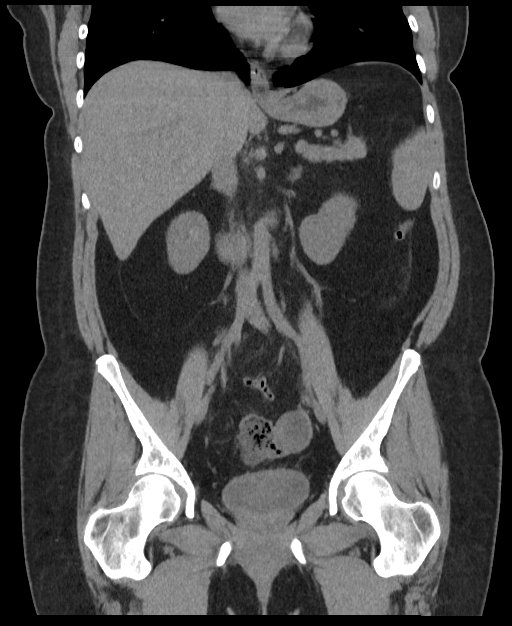

[16 of 46 positions shown; findings below may reference images not displayed]

FINDINGS: Lower chest: No acute findings. A few tiny sub-cm bibasilar
pulmonary nodules remain stable.

Hepatobiliary: No mass visualized on this unenhanced exam.
Gallbladder is unremarkable. No evidence of biliary ductal
dilatation.

Pancreas: No mass or inflammatory process visualized on this
unenhanced exam.

Spleen:  Within normal limits in size.

Adrenals/Urinary tract: A 4 mm calculus is seen in lower pole of
left kidney. No evidence of ureteral calculi or hydronephrosis.
Unremarkable unopacified urinary bladder.

Stomach/Bowel: Diffuse colonic diverticulosis is noted. Focal area
of wall thickening and mild pericolonic inflammatory change is seen
involving the proximal transverse colon, consistent with
diverticulitis. No evidence of abscess or free air. No evidence of
bowel obstruction.

Vascular/Lymphatic: No pathologically enlarged lymph nodes
identified. No evidence of abdominal aortic aneurysm.

Reproductive: Previous supracervical hysterectomy. 3.6 cm
benign-appearing left ovarian cyst is stable since previous study,
consistent with benign etiology. No other pelvic mass or free fluid
identified.

Other:  None.

Musculoskeletal:  No suspicious bone lesions identified.
IMPRESSION: Mild diverticulitis involving proximal transverse colon. No evidence
of abscess or other complication.

Tiny nonobstructing left renal calculus.

Stable 3.6 cm benign-appearing left ovarian cyst, consistent with
benign etiology.

## 2020-09-22 ENCOUNTER — Other Ambulatory Visit: Payer: Self-pay | Admitting: Family

## 2020-09-22 DIAGNOSIS — J309 Allergic rhinitis, unspecified: Secondary | ICD-10-CM

## 2020-10-19 ENCOUNTER — Other Ambulatory Visit: Payer: Self-pay | Admitting: Family

## 2020-10-19 DIAGNOSIS — J309 Allergic rhinitis, unspecified: Secondary | ICD-10-CM

## 2020-10-30 ENCOUNTER — Encounter: Payer: Self-pay | Admitting: *Deleted

## 2020-10-30 ENCOUNTER — Other Ambulatory Visit: Payer: Self-pay

## 2020-10-30 ENCOUNTER — Ambulatory Visit: Payer: BC Managed Care – PPO | Admitting: Family

## 2020-10-30 ENCOUNTER — Encounter: Payer: Self-pay | Admitting: Family

## 2020-10-30 VITALS — BP 102/68 | HR 101 | Temp 98.0°F | Ht 62.0 in | Wt 151.2 lb

## 2020-10-30 DIAGNOSIS — F418 Other specified anxiety disorders: Secondary | ICD-10-CM

## 2020-10-30 DIAGNOSIS — I10 Essential (primary) hypertension: Secondary | ICD-10-CM | POA: Diagnosis not present

## 2020-10-30 DIAGNOSIS — R42 Dizziness and giddiness: Secondary | ICD-10-CM | POA: Insufficient documentation

## 2020-10-30 DIAGNOSIS — R519 Headache, unspecified: Secondary | ICD-10-CM

## 2020-10-30 DIAGNOSIS — G8929 Other chronic pain: Secondary | ICD-10-CM

## 2020-10-30 NOTE — Patient Instructions (Addendum)
Please have eye exam  Take buspar THREE times per day  Trial decrease of lisinopril 5mg  to see if dizziness improves. Monitor blood pressure.  Please bring blood pressure machine with you.  Monitor blood pressure   Referral to counseling, pulmonology Let know if you dont hear back within a week in regards to an appointment being scheduled.

## 2020-10-30 NOTE — Assessment & Plan Note (Addendum)
Reassuring neurologic exam. She is not orthostatic. Dizziness acute on chronic for years.  Question whether dizziness related to anxiety or low end blood pressure . Trial decrease lisinopril to 5mg . Question HA related to anxiety. Consider MRI Brain at follow up.  Consider propranolol for HA,anxiety however opted not to start in setting of dizziness.

## 2020-10-30 NOTE — Progress Notes (Signed)
Subjective:    Patient ID: Annette Gonzales, female    DOB: 05/26/1971, 50 y.o.   MRN: [id number]  CC: Annette Gonzales is a 50 y.o. female who presents today for follow up.   HPI: Accompanied husband today.   She is working for [employer] and she has taken on temporary position working from home. Reports that the last 10 months ago have been very stressful. She is not happy with her job.   Compliant with  lexapro to 10mg  qd. Continue buspar 7.5 mg  BID with improvement. She is more tearful, trouble falling asleep.   She complains of more recurrent HA, approx 3 headaches per week. HA occurs in the afternoon. She sits at computer.HA is not the worse HA of life. HA is not positional , nor exertional.   She also complains of dizziness, when sitting or laying in bed. She states this has been the case for many years.  No syncope, vertigo,  cp, palpitations, vision changes. No si/hi. One cup of coffee per day.   Rare use of NSAID. Advil will relieve HA.   Chronic right shoulder pain. No neck pain.  Eye exam due   HTN- compliant with lisinopril 10mg . Reports at work BP will escalate to 130/80.  At home 122/81. No cp, sob  She snores.      HISTORY:  Past Medical History:  Diagnosis Date  . Asthma   . Depression   . Diverticulitis   . Frequent headaches   . GERD (gastroesophageal reflux disease)   . History of kidney stones   . History of stomach ulcers history of mouth ulcers  . Hyperlipidemia   . Hypertension   . Urinary tract bacterial infections    Past Surgical History:  Procedure Laterality Date  . ABDOMINAL HYSTERECTOMY  2005   Uterus only  . APPENDECTOMY  1985  . HERNIA REPAIR  2012  . LEEP    . TONSILLECTOMY    . TONSILLECTOMY AND ADENOIDECTOMY  1989   Family History  Problem Relation Age of Onset  . Arthritis Mother   . Cancer Mother        Breast Cancer  . Heart disease Mother        Mitral valve disease  . Hyperlipidemia Father   . Heart disease Father    . Stroke Father   . Hypertension Father   . Arthritis Maternal Grandmother   . Mental illness Maternal Grandmother   . Cancer Maternal Grandfather        Colon/ Prostate Cancer  . Heart disease Maternal Grandfather   . Stroke Maternal Grandfather   . Diabetes Maternal Grandfather   . Arthritis Paternal Grandmother   . Hypertension Paternal Grandmother        renal artery stenosis  . Heart attack Paternal Grandfather   . Hypertension Sister   . Heart disease Sister        tachycardia s/p ablation    Allergies: Sulfa antibiotics Current Outpatient Medications on File Prior to Visit  Medication Sig Dispense Refill  . busPIRone (BUSPAR) 7.5 MG tablet Take 1 tablet by mouth three times daily. 270 tablet 0  . escitalopram (LEXAPRO) 10 MG tablet Take 1 tablet (10 mg total) by mouth daily. 90 tablet 1  . fluticasone (FLONASE) 50 MCG/ACT nasal spray Place 2 sprays into both nostrils daily. 16 g 2  . gabapentin (NEURONTIN) 100 MG capsule Take 1 capsule (100 mg total) by mouth 3 (three) times daily as needed. 90 capsule  0  . HYDROcodone-acetaminophen (NORCO/VICODIN) 5-325 MG tablet Take 1 tablet by mouth every 6 (six) hours as needed for moderate pain. 20 tablet 0  . lisinopril (ZESTRIL) 10 MG tablet Take 1 tablet (10 mg total) by mouth daily. 90 tablet 1  . montelukast (SINGULAIR) 10 MG tablet Take 1 tablet by mouth daily at bedtime. 30 tablet 0  . albuterol (PROVENTIL HFA;VENTOLIN HFA) 108 (90 Base) MCG/ACT inhaler Inhale 2 puffs into the lungs every 6 (six) hours as needed for wheezing or shortness of breath. (Patient not taking: Reported on 10/30/2020) 1 Inhaler 0   No current facility-administered medications on file prior to visit.    Social History   Tobacco Use  . Smoking status: Never Smoker  . Smokeless tobacco: Never Used  Vaping Use  . Vaping Use: Never used  Substance Use Topics  . Alcohol use: Yes    Comment: occasionally  . Drug use: No    Review of Systems   Constitutional: Negative for chills and fever.  Eyes: Negative for visual disturbance.  Respiratory: Negative for cough.   Cardiovascular: Negative for chest pain and palpitations.  Gastrointestinal: Negative for nausea and vomiting.  Neurological: Positive for dizziness and headaches. Negative for syncope.  Psychiatric/Behavioral: Positive for sleep disturbance. Negative for suicidal ideas. The patient is nervous/anxious.       Objective:    BP 102/68   Pulse (!) 101   Temp 98 F (36.7 C)   Ht 5\' 2"  (1.575 m)   Wt 151 lb 3.2 oz (68.6 kg)   SpO2 97%   BMI 27.65 kg/m  BP Readings from Last 3 Encounters:  10/30/20 102/68  04/23/20 (!) 143/90  09/17/18 98/64   Wt Readings from Last 3 Encounters:  10/30/20 151 lb 3.2 oz (68.6 kg)  05/29/20 147 lb (66.7 kg)  04/27/20 147 lb (66.7 kg)    Physical Exam Vitals reviewed.  Constitutional:      Appearance: She is well-developed.  HENT:     Mouth/Throat:     Pharynx: Uvula midline.  Eyes:     Conjunctiva/sclera: Conjunctivae normal.     Pupils: Pupils are equal, round, and reactive to light.     Comments: Fundus normal bilaterally.   Cardiovascular:     Rate and Rhythm: Normal rate and regular rhythm.     Pulses: Normal pulses.     Heart sounds: Normal heart sounds.  Pulmonary:     Effort: Pulmonary effort is normal.     Breath sounds: Normal breath sounds. No wheezing, rhonchi or rales.  Skin:    General: Skin is warm and dry.  Neurological:     Mental Status: She is alert.     Cranial Nerves: No cranial nerve deficit.     Sensory: No sensory deficit.     Deep Tendon Reflexes:     Reflex Scores:      Bicep reflexes are 2+ on the right side and 2+ on the left side.      Patellar reflexes are 2+ on the right side and 2+ on the left side.    Comments: Grip equal and strong bilateral upper extremities. Gait strong and steady. Able to perform rapid alternating movement without difficulty.   Psychiatric:        Speech:  Speech normal.        Behavior: Behavior normal.        Thought Content: Thought content normal.        Assessment & Plan:   Problem  List Items Addressed This Visit      Cardiovascular and Mediastinum   Benign essential HTN    Controlled in the office today. Suspect anxiety, HA at work is contributing to escalations of BP while at work. In setting of dizziness, hesitate to increase lisinopril due to work escalations and will monitor closely. Due to dizziness, agreed to decrease lisinopril to 5mg  with close BP monitoring. Question as well if undiagnosed OSA contributory.       Relevant Orders   Ambulatory referral to Pulmonology   CBC with Differential/Platelet   Comprehensive metabolic panel   TSH     Other   Depression with anxiety - Primary    Uncontrolled. Discussed completion of FMLA paperwork so that patient may focus on herself and next steps regarding employment. She will bring in paperwork for me to complete. Continue lexapro 10mg  and advised to take the third dose of buspar 7.5mg  TID for better control of anxiety, thus blood pressure. Referral to counseling.      Relevant Orders   Ambulatory referral to Pulmonology   Ambulatory referral to Psychology   TSH   Dizziness    Reassuring neurologic exam. She is not orthostatic. Dizziness acute on chronic for years.  Question whether dizziness related to anxiety or low end blood pressure . Trial decrease lisinopril to 5mg . Question HA related to anxiety. Consider MRI Brain at follow up.  Consider propranolol for HA,anxiety however opted not to start in setting of dizziness.       Headache    Reassuring neurologic exam. Pursuing evaluation for OSA. Close follow up in 3 weeks.           I am having Jude B. Bickford maintain her albuterol, fluticasone, gabapentin, escitalopram, lisinopril, HYDROcodone-acetaminophen, busPIRone, and montelukast.   No orders of the defined types were placed in this encounter.   Return  precautions given.   Risks, benefits, and alternatives of the medications and treatment plan prescribed today were discussed, and patient expressed understanding.   Education regarding symptom management and diagnosis given to patient on AVS.  Continue to follow with , FNP for routine health maintenance.   and I agreed with plan.   , FNP

## 2020-11-02 ENCOUNTER — Encounter: Payer: Self-pay | Admitting: Family

## 2020-11-02 NOTE — Assessment & Plan Note (Addendum)
Controlled in the office today. Suspect anxiety, HA at work is contributing to escalations of BP while at work. In setting of dizziness, hesitate to increase lisinopril due to work escalations and will monitor closely. Due to dizziness, agreed to decrease lisinopril to 5mg  with close BP monitoring. Question as well if undiagnosed OSA contributory.

## 2020-11-02 NOTE — Assessment & Plan Note (Signed)
Reassuring neurologic exam. Pursuing evaluation for OSA. Close follow up in 3 weeks.

## 2020-11-02 NOTE — Assessment & Plan Note (Addendum)
Uncontrolled. Discussed completion of FMLA paperwork so that patient may focus on herself and next steps regarding employment. She will bring in paperwork for me to complete. Continue lexapro 10mg  and advised to take the third dose of buspar 7.5mg  TID for better control of anxiety, thus blood pressure. Referral to counseling.

## 2020-11-03 NOTE — Telephone Encounter (Signed)
Filled in and Patient informed that this is ready for pick-up.   Patient will come by and this has been placed upfront

## 2020-11-04 ENCOUNTER — Other Ambulatory Visit: Payer: BC Managed Care – PPO

## 2020-11-04 NOTE — Addendum Note (Signed)
Addended by: Janaiah Vetrano S on: 11/04/2020 04:45 PM   Modules accepted: Orders  

## 2020-11-04 NOTE — Addendum Note (Signed)
Addended by: Warden Fillers on: 11/04/2020 04:45 PM   Modules accepted: Orders

## 2020-11-04 NOTE — Addendum Note (Signed)
Addended by: Harlan Ervine S on: 11/04/2020 04:45 PM   Modules accepted: Orders  

## 2020-11-05 ENCOUNTER — Other Ambulatory Visit: Payer: BC Managed Care – PPO

## 2020-11-09 ENCOUNTER — Other Ambulatory Visit (INDEPENDENT_AMBULATORY_CARE_PROVIDER_SITE_OTHER): Payer: BC Managed Care – PPO

## 2020-11-09 ENCOUNTER — Other Ambulatory Visit: Payer: Self-pay

## 2020-11-09 DIAGNOSIS — I10 Essential (primary) hypertension: Secondary | ICD-10-CM | POA: Diagnosis not present

## 2020-11-09 DIAGNOSIS — F418 Other specified anxiety disorders: Secondary | ICD-10-CM

## 2020-11-10 ENCOUNTER — Other Ambulatory Visit: Payer: Self-pay | Admitting: Family

## 2020-11-10 DIAGNOSIS — F418 Other specified anxiety disorders: Secondary | ICD-10-CM

## 2020-11-10 LAB — COMPREHENSIVE METABOLIC PANEL
AG Ratio: 1.7 (calc) (ref 1.0–2.5)
ALT: 18 U/L (ref 6–29)
AST: 31 U/L (ref 10–35)
Albumin: 4.3 g/dL (ref 3.6–5.1)
Alkaline phosphatase (APISO): 80 U/L (ref 31–125)
BUN: 9 mg/dL (ref 7–25)
CO2: 23 mmol/L (ref 20–32)
Calcium: 8.7 mg/dL (ref 8.6–10.2)
Chloride: 103 mmol/L (ref 98–110)
Creat: 0.72 mg/dL (ref 0.50–1.10)
Globulin: 2.6 g/dL (calc) (ref 1.9–3.7)
Glucose, Bld: 123 mg/dL — ABNORMAL HIGH (ref 65–99)
Potassium: 4.9 mmol/L (ref 3.5–5.3)
Sodium: 137 mmol/L (ref 135–146)
Total Bilirubin: 0.5 mg/dL (ref 0.2–1.2)
Total Protein: 6.9 g/dL (ref 6.1–8.1)

## 2020-11-10 LAB — HEMOGLOBIN A1C
Hgb A1c MFr Bld: 6.2 % of total Hgb — ABNORMAL HIGH (ref <5.7)
Mean Plasma Glucose: 131 mg/dL
eAG (mmol/L): 7.3 mmol/L

## 2020-11-10 LAB — LIPID PANEL
Cholesterol: 198 mg/dL (ref <200)
HDL: 40 mg/dL — ABNORMAL LOW (ref >=50)
LDL Cholesterol (Calc): 126 mg/dL (calc) — ABNORMAL HIGH
Non-HDL Cholesterol (Calc): 158 mg/dL (calc) — ABNORMAL HIGH (ref <130)
Total CHOL/HDL Ratio: 5 (calc) — ABNORMAL HIGH (ref <5.0)
Triglycerides: 199 mg/dL — ABNORMAL HIGH (ref <150)

## 2020-11-10 LAB — TSH: TSH: 2.24 mIU/L

## 2020-11-10 LAB — CBC WITH DIFFERENTIAL/PLATELET

## 2020-11-10 LAB — VITAMIN D 25 HYDROXY (VIT D DEFICIENCY, FRACTURES): Vit D, 25-Hydroxy: 18 ng/mL — ABNORMAL LOW (ref 30–100)

## 2020-11-11 ENCOUNTER — Telehealth: Payer: Self-pay

## 2020-11-11 ENCOUNTER — Encounter: Payer: Self-pay | Admitting: Family

## 2020-11-11 NOTE — Telephone Encounter (Signed)
LMTCB for lab results.  

## 2020-11-12 ENCOUNTER — Encounter: Payer: Self-pay | Admitting: Family

## 2020-11-12 ENCOUNTER — Other Ambulatory Visit: Payer: Self-pay

## 2020-11-12 DIAGNOSIS — I10 Essential (primary) hypertension: Secondary | ICD-10-CM

## 2020-11-12 DIAGNOSIS — F418 Other specified anxiety disorders: Secondary | ICD-10-CM

## 2020-11-12 DIAGNOSIS — J309 Allergic rhinitis, unspecified: Secondary | ICD-10-CM

## 2020-11-12 MED ORDER — BUSPIRONE HCL 7.5 MG PO TABS: 7.5000 mg | ORAL_TABLET | Freq: Three times a day (TID) | ORAL | 1 refills | Status: DC

## 2020-11-12 MED ORDER — LISINOPRIL 10 MG PO TABS: 10.0000 mg | ORAL_TABLET | Freq: Every day | ORAL | 1 refills | Status: DC

## 2020-11-12 MED ORDER — MONTELUKAST SODIUM 10 MG PO TABS: 1.0000 | ORAL_TABLET | Freq: Every day | ORAL | 1 refills | Status: DC

## 2020-11-12 MED ORDER — ESCITALOPRAM OXALATE 10 MG PO TABS: 10.0000 mg | ORAL_TABLET | Freq: Every day | ORAL | 1 refills | Status: DC

## 2020-11-13 ENCOUNTER — Ambulatory Visit: Payer: BC Managed Care – PPO | Admitting: Surgical

## 2020-11-13 ENCOUNTER — Other Ambulatory Visit: Payer: Self-pay

## 2020-11-13 ENCOUNTER — Encounter: Payer: Self-pay | Admitting: Surgical

## 2020-11-13 ENCOUNTER — Ambulatory Visit: Payer: Self-pay

## 2020-11-13 DIAGNOSIS — M7501 Adhesive capsulitis of right shoulder: Secondary | ICD-10-CM

## 2020-11-13 DIAGNOSIS — M542 Cervicalgia: Secondary | ICD-10-CM | POA: Diagnosis not present

## 2020-11-13 DIAGNOSIS — M25511 Pain in right shoulder: Secondary | ICD-10-CM

## 2020-11-13 NOTE — Progress Notes (Signed)
Office Visit Note   Patient: Annette Gonzales           Date of Birth: [birth year]           MRN: [id number] Visit Date: 11/13/2020 Requested by: Allegra Grana, FNP [address] [place],  [place] [address] PCP: Allegra Grana, FNP  Subjective: Chief Complaint  Patient presents with  . Left Shoulder - Pain    HPI: Annette Gonzales is a 50 y.o. female who presents to the office complaining of right shoulder pain.  Patient complains of return of right shoulder pain following injection with Dr. Prince Rome on 05/29/2020.  This injection helped for 2 months but now pain is returned and seems "different" and worse to her.  She has pain that mostly affects her lateral right shoulder and mid humerus with radiation to the elbow and also sometimes down into her hand.  She has numbness and tingling that affects her every night in her right hand.  Describes the shoulder pain as a "ache".  Denies any neck pain but she does endorse right scapular pain that is new.  This pain wakes her up every night.  No history of neck or shoulder surgery.  She denies any history of injury, describing an insidious onset of right shoulder pain.  She does suspect that may be one of her dogs tugged on the leash too hard several months ago.  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  This is significantly impacting her life to the point where her children have to help her with daily activities..                ROS: All systems reviewed are negative as they relate to the chief complaint within the history of present illness.  Patient denies fevers or chills.  Assessment & Plan: Visit Diagnoses:  1. Adhesive capsulitis of right shoulder   2. Cervicalgia   3. Acute pain of right shoulder     Plan: Patient is a 50 year old female who presents for repeat evaluation of right shoulder pain.  She was seen last year in November 2021 by Dr. August Saucer where she was diagnosed with right shoulder adhesive capsulitis  and had right shoulder injection by Dr. Prince Rome in November that provided 2 months of relief of her symptoms.  Symptoms returned and have now worsened over the last several months.  She wakes with pain pretty much every night.  She also has new symptoms including numbness and tingling into her hand that wakes her up at night as well as radiation of pain into her scapula.  Denies any neck pain.  Today on exam, she has some lack of passive motion of the right shoulder compared with the contralateral shoulder that is slightly improved compared with last exam in November.  However, with long duration of shoulder symptoms and the severity of her pain causing her to wake up with pain every night, plan to order MRI arthrogram for further evaluation of right shoulder pain.  She is experiencing some clicking in the anterior shoulder on examination today and has some mild subscapularis weakness though this is likely more secondary to pain.  She also has fairly profound AC joint tenderness asymmetrically.  Additionally, radiographs of her cervical spine taken today show excessive lordosis with mild degenerative changes throughout.  Plan to order MRI of the cervical spine in addition given the scapular radiation of her pain and the radiation of pain into her hand  with numbness and tingling.  Follow-up after MRI scans to review results.  Follow-Up Instructions: No follow-ups on file.   Orders:  Orders Placed This Encounter  Procedures  . XR Cervical Spine 2 or 3 views   No orders of the defined types were placed in this encounter.     Procedures: No procedures performed   Clinical Data: No additional findings.  Objective: Vital Signs: There were no vitals taken for this visit.  Physical Exam:  Constitutional: Patient appears well-developed HEENT:  Head: Normocephalic Eyes:EOM are normal Neck: Normal range of motion Cardiovascular: Normal rate Pulmonary/chest: Effort normal Neurologic: Patient is  alert Skin: Skin is warm Psychiatric: Patient has normal mood and affect  Ortho Exam: Ortho exam demonstrates left shoulder with 60 degrees external rotation, 120 degrees abduction, 175 degrees forward flexion with internal rotation to the mid thoracic spine.  Right shoulder with 45 degrees external rotation, 85 degrees abduction, 160 degrees forward flexion with internal rotation to about the SI joint.  Tenderness over the bicipital groove mildly and moderately tender over the [facility] joint that is not present on the left AC joint.  There is a occasional clicking sensation noted anteriorly with passive motion of the shoulder.  She has 5/5 motor strength of bilateral grip strength, finger abduction, pronation/supination, bicep, tricep, deltoid.  Mild to moderate tenderness throughout the inferior axial cervical spine.  No pain with cervical spine active range of motion.  5/5 motor strength of bilateral supraspinatus, infraspinatus.  5 -/5 strength of subscapularis on the right side with mildly positive belly press test that is more likely due to pain than true weakness.  Negative Hornblower test.  Negative drop arm test.  Specialty Comments:  No specialty comments available.  Imaging: No results found.   PMFS History: Patient Active Problem List   Diagnosis Date Noted  . Dizziness 10/30/2020  . Herpes zoster without complication 04/27/2020  . Flank pain 05/09/2019  . Allergic rhinitis 09/17/2018  . Screen for colon cancer 04/03/2017  . Stress incontinence of urine 07/07/2016  . Shoulder pain 06/20/2016  . History of asthma 04/24/2015  . Depression with anxiety 04/24/2015  . Arthralgia of both knees 04/24/2015  . Diverticulosis of colon without hemorrhage 04/24/2015  . Headache 04/24/2015  . GERD (gastroesophageal reflux disease) 04/24/2015  . History of gastric ulcer 04/24/2015  . Benign essential HTN 04/24/2015  . HLD (hyperlipidemia) 04/24/2015  . History of nephrolithiasis 04/24/2015   . Overweight 04/24/2015  . Encounter to establish care 04/24/2015   Past Medical History:  Diagnosis Date  . Asthma   . Depression   . Diverticulitis   . Frequent headaches   . GERD (gastroesophageal reflux disease)   . History of kidney stones   . History of stomach ulcers history of mouth ulcers  . Hyperlipidemia   . Hypertension   . Urinary tract bacterial infections     Family History  Problem Relation Age of Onset  . Arthritis Mother   . Cancer Mother        Breast Cancer  . Heart disease Mother        Mitral valve disease  . Hyperlipidemia Father   . Heart disease Father   . Stroke Father   . Hypertension Father   . Arthritis Maternal Grandmother   . Mental illness Maternal Grandmother   . Cancer Maternal Grandfather        Colon/ Prostate Cancer  . Heart disease Maternal Grandfather   . Stroke Maternal Grandfather   .  Diabetes Maternal Grandfather   . Arthritis Paternal Grandmother   . Hypertension Paternal Grandmother        renal artery stenosis  . Heart attack Paternal Grandfather   . Hypertension Sister   . Heart disease Sister        tachycardia s/p ablation    Past Surgical History:  Procedure Laterality Date  . ABDOMINAL HYSTERECTOMY  2005   Uterus only  . APPENDECTOMY  1985  . HERNIA REPAIR  2012  . LEEP    . TONSILLECTOMY    . TONSILLECTOMY AND ADENOIDECTOMY  1989   Social History   Occupational History  . Not on file  Tobacco Use  . Smoking status: Never Smoker  . Smokeless tobacco: Never Used  Vaping Use  . Vaping Use: Never used  Substance and Sexual Activity  . Alcohol use: Yes    Comment: occasionally  . Drug use: No  . Sexual activity: Not on file

## 2020-11-16 ENCOUNTER — Other Ambulatory Visit: Payer: Self-pay

## 2020-11-16 ENCOUNTER — Ambulatory Visit: Payer: BC Managed Care – PPO | Admitting: Family

## 2020-11-16 ENCOUNTER — Encounter: Payer: Self-pay | Admitting: Family

## 2020-11-16 VITALS — BP 116/78 | HR 82 | Temp 97.9°F | Ht 62.0 in | Wt 153.0 lb

## 2020-11-16 DIAGNOSIS — R42 Dizziness and giddiness: Secondary | ICD-10-CM | POA: Diagnosis not present

## 2020-11-16 DIAGNOSIS — F418 Other specified anxiety disorders: Secondary | ICD-10-CM

## 2020-11-16 DIAGNOSIS — I10 Essential (primary) hypertension: Secondary | ICD-10-CM | POA: Diagnosis not present

## 2020-11-16 LAB — CBC WITH DIFFERENTIAL/PLATELET
Basophils Absolute: 0 10*3/uL (ref 0.0–0.1)
Basophils Relative: 0.7 % (ref 0.0–3.0)
Eosinophils Absolute: 0.2 10*3/uL (ref 0.0–0.7)
Eosinophils Relative: 3 % (ref 0.0–5.0)
HCT: 41 % (ref 36.0–46.0)
Hemoglobin: 13.8 g/dL (ref 12.0–15.0)
Lymphocytes Relative: 29.8 % (ref 12.0–46.0)
Lymphs Abs: 1.8 10*3/uL (ref 0.7–4.0)
MCHC: 33.7 g/dL (ref 30.0–36.0)
MCV: 85.6 fl (ref 78.0–100.0)
Monocytes Absolute: 0.5 10*3/uL (ref 0.1–1.0)
Monocytes Relative: 9.2 % (ref 3.0–12.0)
Neutro Abs: 3.4 10*3/uL (ref 1.4–7.7)
Neutrophils Relative %: 57.3 % (ref 43.0–77.0)
Platelets: 296 10*3/uL (ref 150.0–400.0)
RBC: 4.79 Mil/uL (ref 3.87–5.11)
RDW: 13.9 % (ref 11.5–15.5)
WBC: 6 10*3/uL (ref 4.0–10.5)

## 2020-11-16 MED ORDER — HYDROXYZINE HCL 10 MG PO TABS: 10.0000 mg | ORAL_TABLET | Freq: Two times a day (BID) | ORAL | 0 refills | Status: DC | PRN

## 2020-11-16 MED ORDER — LISINOPRIL 10 MG PO TABS: 5.0000 mg | ORAL_TABLET | Freq: Every day | ORAL | 1 refills | Status: DC

## 2020-11-16 MED ORDER — ESCITALOPRAM OXALATE 5 MG PO TABS: 5.0000 mg | ORAL_TABLET | Freq: Every day | ORAL | 1 refills | Status: DC

## 2020-11-16 NOTE — Patient Instructions (Signed)
Increase lexapro to 15mg   Stop buspar Start atarax as needed for breakthrough anxiety Ordered ultrasound of neck and MRI of brain Let know if you dont hear back within a week in regards to an appointment being scheduled.

## 2020-11-16 NOTE — Assessment & Plan Note (Signed)
Chronic, worsening. Pending carotid US, MRI/MRA brain to evaluate for structural , CV etiology. Discussed dizziness may be related to anxiety. Opted not to stop the lisinopril as concern for hypertension. Close follow up.

## 2020-11-16 NOTE — Assessment & Plan Note (Signed)
Well controlled. Continue lisionopril 5mg 

## 2020-11-16 NOTE — Assessment & Plan Note (Addendum)
Uncontrolled. Although slight improvement as she is not working at this time. Completed STD paperwork. Breakthrough anxiety. Increase lexapro to 15mg  and trial atarax. Stop buspar.

## 2020-11-16 NOTE — Progress Notes (Signed)
Subjective:    Patient ID: Annette Gonzales, female    DOB: 1970/09/27, 49 y.o.   MRN: [id number]  CC: Annette Gonzales is a 50 y.o. female who presents today for follow up.   HPI: She is no longer working and seeking STD. 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   Trouble falling and staying asleep. Breakthrough anxiety attacks during day. Buspar is not particularly helpful   Compliant with lexapro 10mg  and advised to take the third dose of buspar 7.5mg  TID .has tried wellbutrin, zoloft, and celexa   Dizziness- decreased lisinopril to 5mg  and continues to feel dizzy. She has a feeling off balance which has been going on for years however she feels it is more often. She thinks it may be anxiety is related. Notices when walks. She is not dizzy when laying down. No cp, syncope, sob, tinnitus, ear pain , vertigo.   HA resolved. She has upcoming eye appointment next month.   Appointment with behavioral health counselor this week Pending consult for evaluation of OSA     HISTORY:  Past Medical History:  Diagnosis Date  . Asthma   . Depression   . Diverticulitis   . Frequent headaches   . GERD (gastroesophageal reflux disease)   . History of kidney stones   . History of stomach ulcers history of mouth ulcers  . Hyperlipidemia   . Hypertension   . Urinary tract bacterial infections    Past Surgical History:  Procedure Laterality Date  . ABDOMINAL HYSTERECTOMY  2005   Uterus only  . APPENDECTOMY  1985  . HERNIA REPAIR  2012  . LEEP    . TONSILLECTOMY    . TONSILLECTOMY AND ADENOIDECTOMY  1989   Family History  Problem Relation Age of Onset  . Arthritis Mother   . Cancer Mother        Breast Cancer  . Heart disease Mother        Mitral valve disease  . Hyperlipidemia Father   . Heart disease Father   . Stroke Father   . Hypertension  Father   . Arthritis Maternal Grandmother   . Mental illness Maternal Grandmother   . Cancer Maternal Grandfather        Colon/ Prostate Cancer  . Heart disease Maternal Grandfather   . Stroke Maternal Grandfather   . Diabetes Maternal Grandfather   . Arthritis Paternal Grandmother   . Hypertension Paternal Grandmother        renal artery stenosis  . Heart attack Paternal Grandfather   . Hypertension Sister   . Heart disease Sister        tachycardia s/p ablation    Allergies: Sulfa antibiotics Current Outpatient Medications on File Prior to Visit  Medication Sig Dispense Refill  . albuterol (PROVENTIL HFA;VENTOLIN HFA) 108 (90 Base) MCG/ACT inhaler Inhale 2 puffs into the lungs every 6 (six) hours as needed for wheezing or shortness of breath. 1 Inhaler 0  . escitalopram (LEXAPRO) 10 MG tablet Take 1 tablet (10 mg total) by mouth daily. 90 tablet 1  . fluticasone (FLONASE) 50 MCG/ACT nasal spray Place 2 sprays into both nostrils daily. 16 g 2  . montelukast (SINGULAIR) 10 MG tablet Take 1 tablet (10 mg total) by mouth at bedtime. 90 tablet 1   No current facility-administered medications on file prior to visit.    Social  History   Tobacco Use  . Smoking status: Never Smoker  . Smokeless tobacco: Never Used  Vaping Use  . Vaping Use: Never used  Substance Use Topics  . Alcohol use: Yes    Comment: occasionally  . Drug use: No    Review of Systems  Constitutional: Negative for chills and fever.  HENT: Negative for congestion, ear pain and sore throat.   Respiratory: Negative for cough.   Cardiovascular: Negative for chest pain and palpitations.  Gastrointestinal: Negative for nausea and vomiting.  Neurological: Positive for dizziness. Negative for headaches.  Psychiatric/Behavioral: Positive for sleep disturbance. The patient is nervous/anxious.       Objective:    BP 116/78   Pulse 82   Temp 97.9 F (36.6 C)   Ht 5\' 2"  (1.575 m)   Wt 153 lb (69.4 kg)   SpO2  98%   BMI 27.98 kg/m  BP Readings from Last 3 Encounters:  11/16/20 116/78  10/30/20 102/68  04/23/20 (!) 143/90   Wt Readings from Last 3 Encounters:  11/16/20 153 lb (69.4 kg)  10/30/20 151 lb 3.2 oz (68.6 kg)  05/29/20 147 lb (66.7 kg)    Physical Exam Vitals reviewed.  Constitutional:      Appearance: She is well-developed.  HENT:     Right Ear: Ear canal and external ear normal. Tympanic membrane is not injected.     Left Ear: Ear canal and external ear normal. Tympanic membrane is not injected.  Eyes:     Conjunctiva/sclera: Conjunctivae normal.  Neck:     Vascular: No carotid bruit.  Cardiovascular:     Rate and Rhythm: Normal rate and regular rhythm.     Pulses: Normal pulses.     Heart sounds: Normal heart sounds.  Pulmonary:     Effort: Pulmonary effort is normal.     Breath sounds: Normal breath sounds. No wheezing, rhonchi or rales.  Skin:    General: Skin is warm and dry.  Neurological:     Mental Status: She is alert.  Psychiatric:        Speech: Speech normal.        Behavior: Behavior normal.        Thought Content: Thought content normal.        Assessment & Plan:   Problem List Items Addressed This Visit      Cardiovascular and Mediastinum   Benign essential HTN    Well controlled. Continue lisionopril 5mg        Relevant Medications   lisinopril (ZESTRIL) 10 MG tablet     Other   Depression with anxiety - Primary    Uncontrolled. Although slight improvement as she is not working at this time. Completed STD paperwork. Breakthrough anxiety. Increase lexapro to 15mg  and trial atarax. Stop buspar.       Relevant Medications   escitalopram (LEXAPRO) 5 MG tablet   hydrOXYzine (ATARAX/VISTARIL) 10 MG tablet   Dizziness    Chronic, worsening. Pending carotid 13/05/21, MRI/MRA brain to evaluate for structural , CV etiology. Discussed dizziness may be related to anxiety. Opted not to stop the lisinopril as concern for hypertension. Close follow up.       Relevant Orders   CBC with Differential/Platelet   Carotid Bilateral   MR BRAIN W WO CONTRAST   MR ANGIO HEAD WO CONTRAST    Other Visit Diagnoses    Essential hypertension       Relevant Medications   lisinopril (ZESTRIL) 10 MG tablet  I have discontinued Aamari B. Douglas's gabapentin, HYDROcodone-acetaminophen, and busPIRone. I have also changed her lisinopril. Additionally, I am having her start on escitalopram and hydrOXYzine. Lastly, I am having her maintain her albuterol, fluticasone, montelukast, and escitalopram.   Meds ordered this encounter  Medications  . escitalopram (LEXAPRO) 5 MG tablet    Sig: Take 1 tablet (5 mg total) by mouth daily.    Dispense:  90 tablet    Refill:  1    Order Specific Question:   Supervising Provider    Answer:   Duncan Dull L [[id number]]  . hydrOXYzine (ATARAX/VISTARIL) 10 MG tablet    Sig: Take 1 tablet (10 mg total) by mouth 2 (two) times daily as needed for anxiety.    Dispense:  60 tablet    Refill:  0    Order Specific Question:   Supervising Provider    Answer:   Duncan Dull L [[id number]]  . lisinopril (ZESTRIL) 10 MG tablet    Sig: Take 0.5 tablets (5 mg total) by mouth daily.    Dispense:  90 tablet    Refill:  1    Order Specific Question:   Supervising Provider    Answer:   Sherlene Shams [[id number]]    Return precautions given.   Risks, benefits, and alternatives of the medications and treatment plan prescribed today were discussed, and patient expressed understanding.   Education regarding symptom management and diagnosis given to patient on AVS.  Continue to follow with Allegra Grana, FNP for routine health maintenance.   Annette Gonzales and I agreed with plan.   Rennie Plowman, FNP

## 2020-11-18 ENCOUNTER — Ambulatory Visit (INDEPENDENT_AMBULATORY_CARE_PROVIDER_SITE_OTHER): Payer: BC Managed Care – PPO | Admitting: Psychology

## 2020-11-18 DIAGNOSIS — F330 Major depressive disorder, recurrent, mild: Secondary | ICD-10-CM | POA: Diagnosis not present

## 2020-11-19 ENCOUNTER — Telehealth: Payer: Self-pay | Admitting: Family

## 2020-11-19 NOTE — Telephone Encounter (Signed)
Renee from [phone] called in regarding disability paperwork for PT and would like a callback.

## 2020-11-19 NOTE — Telephone Encounter (Signed)
LM for Renee to call back

## 2020-11-20 NOTE — Telephone Encounter (Signed)
Renee called back to go over office visit notes & some clarifications to try to get patient approved for STD. I provided the information that I had & what I had observed of patient upon her last two office visits.

## 2020-11-23 ENCOUNTER — Encounter: Payer: Self-pay | Admitting: Family

## 2020-11-25 ENCOUNTER — Telehealth: Payer: Self-pay | Admitting: Family

## 2020-11-25 NOTE — Telephone Encounter (Signed)
Patient returned referral phone call. 

## 2020-11-25 NOTE — Telephone Encounter (Signed)
lft pt a message to call ofc and lft vm on husband number to call ofc. thanks

## 2020-11-25 NOTE — Telephone Encounter (Signed)
Pt returned your call and states that she will have her phone with her now

## 2020-11-26 ENCOUNTER — Other Ambulatory Visit: Payer: BC Managed Care – PPO

## 2020-12-05 ENCOUNTER — Ambulatory Visit: Payer: BC Managed Care – PPO

## 2020-12-07 ENCOUNTER — Ambulatory Visit: Payer: BC Managed Care – PPO | Admitting: Psychology

## 2020-12-08 ENCOUNTER — Institutional Professional Consult (permissible substitution): Payer: BC Managed Care – PPO | Admitting: Adult Health

## 2020-12-09 ENCOUNTER — Other Ambulatory Visit: Payer: BC Managed Care – PPO

## 2020-12-23 ENCOUNTER — Ambulatory Visit: Payer: BC Managed Care – PPO | Admitting: Family

## 2020-12-24 ENCOUNTER — Ambulatory Visit: Payer: BC Managed Care – PPO | Admitting: Psychology

## 2020-12-24 ENCOUNTER — Other Ambulatory Visit: Payer: BC Managed Care – PPO

## 2020-12-25 ENCOUNTER — Other Ambulatory Visit: Payer: BC Managed Care – PPO

## 2021-01-07 ENCOUNTER — Ambulatory Visit: Payer: BLUE CROSS/BLUE SHIELD | Admitting: Psychology

## 2021-01-14 ENCOUNTER — Encounter: Payer: Self-pay | Admitting: Family

## 2021-01-14 ENCOUNTER — Other Ambulatory Visit: Payer: Self-pay

## 2021-01-14 DIAGNOSIS — J309 Allergic rhinitis, unspecified: Secondary | ICD-10-CM

## 2021-01-14 DIAGNOSIS — I10 Essential (primary) hypertension: Secondary | ICD-10-CM

## 2021-01-14 DIAGNOSIS — F418 Other specified anxiety disorders: Secondary | ICD-10-CM

## 2021-01-14 MED ORDER — LISINOPRIL 10 MG PO TABS: 5.0000 mg | ORAL_TABLET | Freq: Every day | ORAL | 1 refills | Status: DC

## 2021-01-14 MED ORDER — ALBUTEROL SULFATE HFA 108 (90 BASE) MCG/ACT IN AERS: 2.0000 | INHALATION_SPRAY | Freq: Four times a day (QID) | RESPIRATORY_TRACT | 0 refills | Status: DC | PRN

## 2021-01-14 MED ORDER — MONTELUKAST SODIUM 10 MG PO TABS: 1.0000 | ORAL_TABLET | Freq: Every day | ORAL | 1 refills | Status: DC

## 2021-01-14 MED ORDER — FLUTICASONE PROPIONATE 50 MCG/ACT NA SUSP: 2.0000 | Freq: Every day | NASAL | 2 refills | Status: DC

## 2021-01-14 MED ORDER — ESCITALOPRAM OXALATE 10 MG PO TABS: 10.0000 mg | ORAL_TABLET | Freq: Every day | ORAL | 1 refills | Status: DC

## 2021-01-14 MED ORDER — ESCITALOPRAM OXALATE 5 MG PO TABS: 5.0000 mg | ORAL_TABLET | Freq: Every day | ORAL | 1 refills | Status: DC

## 2021-01-14 MED ORDER — HYDROXYZINE HCL 10 MG PO TABS: 10.0000 mg | ORAL_TABLET | Freq: Two times a day (BID) | ORAL | 0 refills | Status: DC | PRN

## 2021-01-21 ENCOUNTER — Ambulatory Visit: Payer: BC Managed Care – PPO | Admitting: Psychology

## 2021-01-27 ENCOUNTER — Other Ambulatory Visit: Payer: Self-pay

## 2021-01-27 DIAGNOSIS — F418 Other specified anxiety disorders: Secondary | ICD-10-CM

## 2021-01-27 MED ORDER — ESCITALOPRAM OXALATE 10 MG PO TABS
ORAL_TABLET | ORAL | 1 refills | Status: DC
Start: 2021-01-27 — End: 2021-04-26

## 2021-01-27 MED ORDER — ESCITALOPRAM OXALATE 5 MG PO TABS: ORAL_TABLET | ORAL | 1 refills | Status: DC

## 2021-02-04 ENCOUNTER — Ambulatory Visit: Payer: BLUE CROSS/BLUE SHIELD | Admitting: Psychology

## 2021-02-09 ENCOUNTER — Telehealth: Payer: Self-pay | Admitting: Family

## 2021-02-09 NOTE — Telephone Encounter (Signed)
[employer], [employer], phone # [phone] and fax [phone]. They have questions on directions for montelukast (SINGULAIR) 10 MG tablet, patient would like directions on pack.

## 2021-02-09 NOTE — Telephone Encounter (Signed)
Called [facility] [employer] and spoke with [employer]. She states that a new prescription was needed as the Pt informed them that she will take her medication in the morning instead of at night. The SIG must be updated for them to fill the prescription. Ok to send in new prescription?

## 2021-02-10 ENCOUNTER — Other Ambulatory Visit: Payer: Self-pay

## 2021-02-10 DIAGNOSIS — J309 Allergic rhinitis, unspecified: Secondary | ICD-10-CM

## 2021-02-10 MED ORDER — MONTELUKAST SODIUM 10 MG PO TABS: 10.0000 mg | ORAL_TABLET | Freq: Every day | ORAL | 1 refills | Status: DC

## 2021-02-10 NOTE — Telephone Encounter (Signed)
Call patient Please educate her that Singulair is typically dosed qpm  However if she prefers to take during the day and this helps with compliance that is fine, please change prescription.

## 2021-02-10 NOTE — Telephone Encounter (Signed)
Was unable to reach patient & VM was full. I have revised prescription & sent. I sent patient a mychart message letting her know as well that her VM is full.

## 2021-02-13 ENCOUNTER — Other Ambulatory Visit: Payer: Self-pay | Admitting: Family

## 2021-02-13 DIAGNOSIS — F418 Other specified anxiety disorders: Secondary | ICD-10-CM

## 2021-02-15 ENCOUNTER — Telehealth: Payer: Self-pay | Admitting: Family

## 2021-02-15 NOTE — Telephone Encounter (Signed)
Call pt Sch f/u appt Also, does she want to schedule MRI brain, MR Angio head as we discussed at last visit?   Order will expire

## 2021-02-16 ENCOUNTER — Ambulatory Visit (INDEPENDENT_AMBULATORY_CARE_PROVIDER_SITE_OTHER): Payer: BLUE CROSS/BLUE SHIELD | Admitting: Psychology

## 2021-02-16 DIAGNOSIS — F330 Major depressive disorder, recurrent, mild: Secondary | ICD-10-CM

## 2021-02-16 NOTE — Telephone Encounter (Signed)
noted 

## 2021-02-16 NOTE — Telephone Encounter (Signed)
Pt stated that she does not want to pursue MR angio head or MRI of brain at this time. She said since leaving her job & starting therapy she is doing much better. She is scheduled to f/u with you in August.

## 2021-02-17 ENCOUNTER — Other Ambulatory Visit: Payer: Self-pay | Admitting: Family

## 2021-02-17 DIAGNOSIS — F418 Other specified anxiety disorders: Secondary | ICD-10-CM

## 2021-02-17 MED ORDER — BUSPIRONE HCL 7.5 MG PO TABS: 7.5000 mg | ORAL_TABLET | Freq: Three times a day (TID) | ORAL | 2 refills | Status: AC

## 2021-03-02 ENCOUNTER — Ambulatory Visit (INDEPENDENT_AMBULATORY_CARE_PROVIDER_SITE_OTHER): Payer: BLUE CROSS/BLUE SHIELD | Admitting: Psychology

## 2021-03-02 DIAGNOSIS — F330 Major depressive disorder, recurrent, mild: Secondary | ICD-10-CM | POA: Diagnosis not present

## 2021-03-08 ENCOUNTER — Telehealth: Payer: Self-pay | Admitting: Family

## 2021-03-08 NOTE — Telephone Encounter (Signed)
Lft pt vm to call ofc to sch US. thanks ?

## 2021-03-15 ENCOUNTER — Other Ambulatory Visit: Payer: Self-pay | Admitting: Family

## 2021-03-15 DIAGNOSIS — I10 Essential (primary) hypertension: Secondary | ICD-10-CM

## 2021-03-22 ENCOUNTER — Ambulatory Visit: Payer: BLUE CROSS/BLUE SHIELD | Admitting: Psychology

## 2021-03-23 ENCOUNTER — Ambulatory Visit (INDEPENDENT_AMBULATORY_CARE_PROVIDER_SITE_OTHER): Payer: BLUE CROSS/BLUE SHIELD | Admitting: Psychology

## 2021-03-23 DIAGNOSIS — F330 Major depressive disorder, recurrent, mild: Secondary | ICD-10-CM

## 2021-04-05 ENCOUNTER — Ambulatory Visit: Payer: BLUE CROSS/BLUE SHIELD | Admitting: Psychology

## 2021-04-09 ENCOUNTER — Encounter: Payer: Self-pay | Admitting: Family

## 2021-04-09 DIAGNOSIS — F332 Major depressive disorder, recurrent severe without psychotic features: Secondary | ICD-10-CM | POA: Diagnosis not present

## 2021-04-16 DIAGNOSIS — F332 Major depressive disorder, recurrent severe without psychotic features: Secondary | ICD-10-CM | POA: Diagnosis not present

## 2021-04-19 ENCOUNTER — Ambulatory Visit: Payer: BLUE CROSS/BLUE SHIELD | Admitting: Family

## 2021-04-19 ENCOUNTER — Encounter: Payer: Self-pay | Admitting: Family

## 2021-04-19 ENCOUNTER — Other Ambulatory Visit: Payer: Self-pay

## 2021-04-19 VITALS — BP 118/74 | HR 90 | Temp 99.2°F | Ht 62.0 in | Wt 138.4 lb

## 2021-04-19 DIAGNOSIS — I10 Essential (primary) hypertension: Secondary | ICD-10-CM

## 2021-04-19 DIAGNOSIS — R42 Dizziness and giddiness: Secondary | ICD-10-CM | POA: Diagnosis not present

## 2021-04-19 DIAGNOSIS — F418 Other specified anxiety disorders: Secondary | ICD-10-CM | POA: Diagnosis not present

## 2021-04-19 DIAGNOSIS — F332 Major depressive disorder, recurrent severe without psychotic features: Secondary | ICD-10-CM | POA: Diagnosis not present

## 2021-04-19 DIAGNOSIS — R7303 Prediabetes: Secondary | ICD-10-CM

## 2021-04-19 NOTE — Assessment & Plan Note (Signed)
Slight improvement with reduction of lisinopril to 5 mg.  Patient declines neuroimaging, ultrasound of carotids.  She will me know how she is doing

## 2021-04-19 NOTE — Assessment & Plan Note (Signed)
Improved with TMS as administered by Dr. Maryruth Bun.  Patient declines seeing psychiatry at this time. Long discussion regarding her thoughts of not being her.  She denies any suicidal intent or ideation at this time.  She like to continue Lexapro 10 mg, BuSpar 7.5 mg twice daily. Close follow up.

## 2021-04-19 NOTE — Patient Instructions (Signed)
It is absolute pleasure seeing you today and I am so happy for you.  Please let me know how you are doing.  Look forward to seeing you in 2 months

## 2021-04-19 NOTE — Progress Notes (Signed)
Subjective:    Patient ID: Annette Gonzales, female    DOB: 03/14/1971, 50 y.o.   MRN: [id number]  CC: Annette Gonzales is a 50 y.o. female who presents today for follow up.   HPI: Depression has improved. Feeling more hopeful.  Sleeping better. Less tearful. She is eating healthier.  40 sessions of TMS over 8 weeks. She excited about progress thus far.    Compliant with Lexapro 10mg , she self decreased from 15mg . Some increase of anxiety with TMS. She is Using   buspar 7.5mg  BID prn. She is not using Atarax  Starting TMS therapy with Dr as recommended by her therapist, . Declines psychiatry consult.  She will continue counseling as this has been helpful.  She is not working right now. Husband is very supportive.  She endorses that she has had thoughts in the past of no longer being here.  She describes this as a feeling of hopelessness and that her family may be better off without her.  She has never attempted to hurt herself.  She adamantly denies any plan and states "I would never hurt myself".  She is looking forward to feeling better and being more herself again. No SI/ HI. No suicide plan.   Dizziness is persistent. Slightly better with lisinopril 5mg . No cp, syncope, HA. Declines imaging head, carotids.   Hypertension-compliant lisinopril 5 mg. No cp, sob,    HISTORY:  Past Medical History:  Diagnosis Date   Asthma    Depression    Diverticulitis    Frequent headaches    GERD (gastroesophageal reflux disease)    History of kidney stones    History of stomach ulcers history of mouth ulcers   Hyperlipidemia    Hypertension    Urinary tract bacterial infections    Past Surgical History:  Procedure Laterality Date   ABDOMINAL HYSTERECTOMY  2005   Uterus only   APPENDECTOMY  1985   HERNIA REPAIR  2012   LEEP     TONSILLECTOMY     TONSILLECTOMY AND ADENOIDECTOMY  1989   Family History  Problem Relation Age of Onset   Arthritis Mother    Cancer  Mother        Breast Cancer   Heart disease Mother        Mitral valve disease   Hyperlipidemia Father    Heart disease Father    Stroke Father    Hypertension Father    Arthritis Maternal Grandmother    Mental illness Maternal Grandmother    Cancer Maternal Grandfather        Colon/ Prostate Cancer   Heart disease Maternal Grandfather    Stroke Maternal Grandfather    Diabetes Maternal Grandfather    Arthritis Paternal Grandmother    Hypertension Paternal Grandmother        renal artery stenosis   Heart attack Paternal Grandfather    Hypertension Sister    Heart disease Sister        tachycardia s/p ablation    Allergies: Sulfa antibiotics Current Outpatient Medications on File Prior to Visit  Medication Sig Dispense Refill   albuterol (VENTOLIN HFA) 108 (90 Base) MCG/ACT inhaler Inhale 2 puffs into the lungs every 6 (six) hours as needed for wheezing or shortness of breath. 1 each 0   busPIRone (BUSPAR) 7.5 MG tablet Take 7.5 mg by mouth 2 (two) times daily.     escitalopram (LEXAPRO) 10 MG tablet Take 10 mg daily along with 5 mg to  equal total of 15 mg by mouth daily. (Patient taking differently: 10 mg. Take 10 mg daily along with 5 mg to equal total of 15 mg by mouth daily.) 90 tablet 1   fluticasone (FLONASE) 50 MCG/ACT nasal spray Place 2 sprays into both nostrils daily. 16 g 2   lisinopril (ZESTRIL) 10 MG tablet Take 1 tablet by mouth daily. 90 tablet 0   No current facility-administered medications on file prior to visit.    Social History   Tobacco Use   Smoking status: Never   Smokeless tobacco: Never  Vaping Use   Vaping Use: Never used  Substance Use Topics   Alcohol use: Yes    Comment: occasionally   Drug use: No    Review of Systems  Constitutional:  Negative for chills and fever.  Respiratory:  Negative for cough.   Cardiovascular:  Negative for chest pain and palpitations.  Gastrointestinal:  Negative for nausea and vomiting.  Neurological:   Positive for dizziness. Negative for syncope.  Psychiatric/Behavioral:  Negative for sleep disturbance and suicidal ideas. The patient is nervous/anxious.      Objective:    BP 118/74 (BP Location: Left Arm, Patient Position: Sitting, Cuff Size: Normal)   Pulse 90   Temp 99.2 F (37.3 C) (Oral)   Ht 5\' 2"  (1.575 m)   Wt 138 lb 6.4 oz (62.8 kg)   SpO2 98%   BMI 25.31 kg/m  BP Readings from Last 3 Encounters:  04/19/21 118/74  11/16/20 116/78  10/30/20 102/68   Wt Readings from Last 3 Encounters:  04/19/21 138 lb 6.4 oz (62.8 kg)  11/16/20 153 lb (69.4 kg)  10/30/20 151 lb 3.2 oz (68.6 kg)    Physical Exam Vitals reviewed.  Constitutional:      Appearance: She is well-developed.  Eyes:     Conjunctiva/sclera: Conjunctivae normal.  Cardiovascular:     Rate and Rhythm: Normal rate and regular rhythm.     Pulses: Normal pulses.     Heart sounds: Normal heart sounds.  Pulmonary:     Effort: Pulmonary effort is normal.     Breath sounds: Normal breath sounds. No wheezing, rhonchi or rales.  Skin:    General: Skin is warm and dry.  Neurological:     Mental Status: She is alert.  Psychiatric:        Speech: Speech normal.        Behavior: Behavior normal.        Thought Content: Thought content normal.       Assessment & Plan:   Problem List Items Addressed This Visit       Cardiovascular and Mediastinum   Benign essential HTN    Chronic, stable.  Continue lisinopril 5 mg        Other   Depression with anxiety    Improved with TMS as administered by Dr. 12/30/20.  Patient declines seeing psychiatry at this time. Long discussion regarding her thoughts of not being her.  She denies any suicidal intent or ideation at this time.  She like to continue Lexapro 10 mg, BuSpar 7.5 mg twice daily. Close follow up.       Relevant Medications   busPIRone (BUSPAR) 7.5 MG tablet   Dizziness    Slight improvement with reduction of lisinopril to 5 mg.  Patient declines  neuroimaging, ultrasound of carotids.  She will me know how she is doing      Other Visit Diagnoses     Prediabetes    -  Primary   Relevant Orders   Basic metabolic panel   Hemoglobin A1c        I have discontinued Annette Gonzales's hydrOXYzine and montelukast. I am also having her maintain her fluticasone, albuterol, escitalopram, lisinopril, and busPIRone.   No orders of the defined types were placed in this encounter.   Return precautions given.   Risks, benefits, and alternatives of the medications and treatment plan prescribed today were discussed, and patient expressed understanding.   Education regarding symptom management and diagnosis given to patient on AVS.  Continue to follow with Allegra Grana, FNP for routine health maintenance.   Annette Gonzales and I agreed with plan.   Rennie Plowman, FNP

## 2021-04-19 NOTE — Assessment & Plan Note (Signed)
Chronic, stable.  Continue lisinopril 5 mg 

## 2021-04-20 ENCOUNTER — Ambulatory Visit (INDEPENDENT_AMBULATORY_CARE_PROVIDER_SITE_OTHER): Payer: BLUE CROSS/BLUE SHIELD | Admitting: Psychology

## 2021-04-20 DIAGNOSIS — F330 Major depressive disorder, recurrent, mild: Secondary | ICD-10-CM

## 2021-04-20 DIAGNOSIS — F332 Major depressive disorder, recurrent severe without psychotic features: Secondary | ICD-10-CM | POA: Diagnosis not present

## 2021-04-20 LAB — BASIC METABOLIC PANEL
BUN: 14 mg/dL (ref 6–23)
CO2: 27 mEq/L (ref 19–32)
Calcium: 9.3 mg/dL (ref 8.4–10.5)
Chloride: 103 mEq/L (ref 96–112)
Creatinine, Ser: 0.77 mg/dL (ref 0.40–1.20)
GFR: 90.38 mL/min (ref >60.00)
Glucose, Bld: 95 mg/dL (ref 70–99)
Potassium: 4.4 mEq/L (ref 3.5–5.1)
Sodium: 137 mEq/L (ref 135–145)

## 2021-04-20 LAB — HEMOGLOBIN A1C: Hgb A1c MFr Bld: 6 % (ref 4.6–6.5)

## 2021-04-21 DIAGNOSIS — F332 Major depressive disorder, recurrent severe without psychotic features: Secondary | ICD-10-CM | POA: Diagnosis not present

## 2021-04-22 DIAGNOSIS — F332 Major depressive disorder, recurrent severe without psychotic features: Secondary | ICD-10-CM | POA: Diagnosis not present

## 2021-04-23 DIAGNOSIS — F332 Major depressive disorder, recurrent severe without psychotic features: Secondary | ICD-10-CM | POA: Diagnosis not present

## 2021-04-26 ENCOUNTER — Encounter: Payer: Self-pay | Admitting: Family

## 2021-04-26 DIAGNOSIS — F332 Major depressive disorder, recurrent severe without psychotic features: Secondary | ICD-10-CM | POA: Diagnosis not present

## 2021-04-26 NOTE — Telephone Encounter (Signed)
Called to speak with [facility]. Call was answered and then was disconnected before introductions. Could not leave a message to call back.

## 2021-04-26 NOTE — Telephone Encounter (Signed)
Patient is calling in to check on the status of the message below.She stated that her psychiastrist stop her treatment if they do not hear back from her provider today.Please advise.

## 2021-04-26 NOTE — Telephone Encounter (Signed)
Spoke with pt  She is concerned that since starting TMS  therapy, she thinks lexapro is causing her to feel tired. Yesterday took 5mg  lexapro. She has been missing consecutive doses of lexapro which is typical for her.   She doesn't want to start on different SSRI.   She is using buspar without any side effects. She hasnt been taking consistently.  She feels that anxiety is more bothersome for her at this time.   No si/hi.   She took blood pressure when she was calm and 150/92. She has been compliant with lisinopril 10mg  with an extra half tablet 5mg  yesterday without decrease of BP.  No Cp, sob.    BP Readings from Last 3 Encounters:  04/19/21 118/74  11/16/20 116/78  10/30/20 102/68   Plan:  Continue buspar 7.5mg  BID and will start taking buspar consistently.  She adamantly declines wean off lexapro. She understands serotonin withdrawal but she is too worried about taking medication again.  She would like to stay on lisinopril 10mg .  She will monitor blood pressure and let me know how she is doing.

## 2021-04-28 DIAGNOSIS — F332 Major depressive disorder, recurrent severe without psychotic features: Secondary | ICD-10-CM | POA: Diagnosis not present

## 2021-04-29 ENCOUNTER — Encounter: Payer: Self-pay | Admitting: Family

## 2021-04-29 DIAGNOSIS — F332 Major depressive disorder, recurrent severe without psychotic features: Secondary | ICD-10-CM | POA: Diagnosis not present

## 2021-04-30 ENCOUNTER — Other Ambulatory Visit: Payer: Self-pay | Admitting: Family

## 2021-04-30 DIAGNOSIS — F332 Major depressive disorder, recurrent severe without psychotic features: Secondary | ICD-10-CM | POA: Diagnosis not present

## 2021-04-30 DIAGNOSIS — F418 Other specified anxiety disorders: Secondary | ICD-10-CM

## 2021-05-03 DIAGNOSIS — F332 Major depressive disorder, recurrent severe without psychotic features: Secondary | ICD-10-CM | POA: Diagnosis not present

## 2021-05-04 ENCOUNTER — Ambulatory Visit: Payer: BLUE CROSS/BLUE SHIELD | Admitting: Psychology

## 2021-05-04 ENCOUNTER — Encounter: Payer: Self-pay | Admitting: Family

## 2021-05-04 DIAGNOSIS — F332 Major depressive disorder, recurrent severe without psychotic features: Secondary | ICD-10-CM | POA: Diagnosis not present

## 2021-05-12 ENCOUNTER — Other Ambulatory Visit: Payer: Self-pay

## 2021-05-12 ENCOUNTER — Encounter: Payer: Self-pay | Admitting: Family

## 2021-05-12 ENCOUNTER — Ambulatory Visit (INDEPENDENT_AMBULATORY_CARE_PROVIDER_SITE_OTHER): Payer: BLUE CROSS/BLUE SHIELD | Admitting: Family

## 2021-05-12 VITALS — BP 124/82 | HR 66 | Temp 96.0°F | Ht 62.01 in | Wt 142.2 lb

## 2021-05-12 DIAGNOSIS — I10 Essential (primary) hypertension: Secondary | ICD-10-CM | POA: Diagnosis not present

## 2021-05-12 DIAGNOSIS — R42 Dizziness and giddiness: Secondary | ICD-10-CM

## 2021-05-12 DIAGNOSIS — F418 Other specified anxiety disorders: Secondary | ICD-10-CM

## 2021-05-12 MED ORDER — SERTRALINE HCL 50 MG PO TABS: 25.0000 mg | ORAL_TABLET | Freq: Every day | ORAL | 3 refills | Status: DC

## 2021-05-12 NOTE — Patient Instructions (Signed)
I have ordered MRI brain, MRA head, ultrasound of your carotids. Let us know if you dont hear back within a week in regards to an appointment being scheduled.   Start Zoloft 50 mg however you may start by taking half a tablet, or 25 mg taken at night.  Our hope is for gradual improvement of mood since starting medication; however this may take several weeks.   If you start to have unusual thoughts, thoughts of hurting yourself, or anyone else, please go immediately to the emergency department.   Follow up in 3 weeks.   Please text to [phone] and write the word 'home'. This will put you in touch with trained crisis counselor and resources.    National Suicide Prevention Hotline - available 24 hours a day, 7 days a week.  [phone]  Major Depressive Disorder Major depressive disorder is a mental illness. It also may be called clinical depression or unipolar depression. Major depressive disorder usually causes feelings of sadness, hopelessness, or helplessness. Some people with this disorder do not feel particularly sad but lose interest in doing things they used to enjoy (anhedonia). Major depressive disorder also can cause physical symptoms. It can interfere with work, school, relationships, and other normal everyday activities. The disorder varies in severity but is longer lasting and more serious than the sadness we all feel from time to time in our lives. Major depressive disorder often is triggered by stressful life events or major life changes. Examples of these triggers include divorce, loss of your job or home, a move, and the death of a family member or close friend. Sometimes this disorder occurs for no obvious reason at all. People who have family members with major depressive disorder or bipolar disorder are at higher risk for developing this disorder, with or without life stressors. Major depressive disorder can occur at any age. It may occur just once in your life (single episode  major depressive disorder). It may occur multiple times (recurrent major depressive disorder). SYMPTOMS People with major depressive disorder have either anhedonia or depressed mood on nearly a daily basis for at least 2 weeks or longer. Symptoms of depressed mood include: Feelings of sadness (blue or down in the dumps) or emptiness. Feelings of hopelessness or helplessness. Tearfulness or episodes of crying (may be observed by others). Irritability (children and adolescents). In addition to depressed mood or anhedonia or both, people with this disorder have at least four of the following symptoms: Difficulty sleeping or sleeping too much.   Significant change (increase or decrease) in appetite or weight.   Lack of energy or motivation. Feelings of guilt and worthlessness.   Difficulty concentrating, remembering, or making decisions. Unusually slow movement (psychomotor retardation) or restlessness (as observed by others).   Recurrent wishes for death, recurrent thoughts of self-harm (suicide), or a suicide attempt. People with major depressive disorder commonly have persistent negative thoughts about themselves, other people, and the world. People with severe major depressive disorder may experience distorted beliefs or perceptions about the world (psychotic delusions). They also may see or hear things that are not real (psychotic hallucinations). DIAGNOSIS Major depressive disorder is diagnosed through an assessment by your health care provider. Your health care provider will ask about aspects of your daily life, such as mood, sleep, and appetite, to see if you have the diagnostic symptoms of major depressive disorder. Your health care provider may ask about your medical history and use of alcohol or drugs, including prescription medicines. Your health care provider  also may do a physical exam and blood work. This is because certain medical conditions and the use of certain substances can cause  major depressive disorder-like symptoms (secondary depression). Your health care provider also may refer you to a mental health specialist for further evaluation and treatment. TREATMENT It is important to recognize the symptoms of major depressive disorder and seek treatment. The following treatments can be prescribed for this disorder:   Medicine. Antidepressant medicines usually are prescribed. Antidepressant medicines are thought to correct chemical imbalances in the brain that are commonly associated with major depressive disorder. Other types of medicine may be added if the symptoms do not respond to antidepressant medicines alone or if psychotic delusions or hallucinations occur. Talk therapy. Talk therapy can be helpful in treating major depressive disorder by providing support, education, and guidance. Certain types of talk therapy also can help with negative thinking (cognitive behavioral therapy) and with relationship issues that trigger this disorder (interpersonal therapy). A mental health specialist can help determine which treatment is best for you. Most people with major depressive disorder do well with a combination of medicine and talk therapy. Treatments involving electrical stimulation of the brain can be used in situations with extremely severe symptoms or when medicine and talk therapy do not work over time. These treatments include electroconvulsive therapy, transcranial magnetic stimulation, and vagal nerve stimulation.   This information is not intended to replace advice given to you by your health care provider. Make sure you discuss any questions you have with your health care provider.   Document Released: 11/05/2012 Document Revised: 08/01/2014 Document Reviewed: 11/05/2012 Elsevier Interactive Patient Education [employer]

## 2021-05-12 NOTE — Assessment & Plan Note (Addendum)
Chronic,Uncontrolled.  Dizziness not affected by changing positions.  She is no longer on lisinopril.  Patient is agreeable to  pursuing neuroimaging, ultrasound carotids.  Pending at this time.  Symptoms not entirely consistent with vertigo.  Consider physical therapy. Perform orthostatic blood pressure at follow up.

## 2021-05-12 NOTE — Assessment & Plan Note (Signed)
Uncontrolled.  Awaiting psychiatry appointment and have sent a message to referral pending to check on status of this.  Patient has been on Zoloft in the past is comfortable and recently starting.  She is prescribed 1 starting at the lowest dose so I prescribed Zoloft 25 mg.  She will continue BuSpar as needed.  She will no longer participate in TMS as she is concerned this may have worsened her depression and caused hearing loss.Hearing loss has improved and will  continue to monitor .  If this were to persist at follow up, would arrange ENT consult for further evaluation

## 2021-05-12 NOTE — Assessment & Plan Note (Signed)
Chronic, stable without lisinopril.  We will continue to monitor.

## 2021-05-12 NOTE — Progress Notes (Signed)
Subjective:    Patient ID: Annette Gonzales, female    DOB: 07-Nov-1970, 50 y.o.   MRN: [id number]  CC: Annette Gonzales is a 50 y.o. female who presents today for follow up.   HPI: She just back from beach and 'it was wonderful'. She feels better after trip.   She has stopped TMS as after first treatment noticed decreased hearnng and worsening depression.  . No tinnitus, fever, ear drainage, congestion.   Hearing  has improved since she has stopped TMS. She didn't wear plugs for 2 treatments and treatment was loud.   She has stopped the lexapro She is using buspar only prn.   She denies suicide plan.  She lives for her children, her dogs.  No homicidal ideation   Since TMS she hasnt been able 'to take medicine' . When she takes medication, her entire body feels numbness.  No confusion, slurring words, HA ,neck pain, arm weakness.   She continues to complain of dizziness with walking, driving for years, worsening.  She feels dizzy speaking with me today.  Describes a feeling of 'off balance'. Feels that she staggers.  Not worse with position changes. Not associated with syncope, HA, fever, pulsatile tinnitus,vertigo, cp, palpitations, leg weakness .    No family h/o aneurysm.  She is no longer on lisinopril.  She had been lexapro for years.She had been on zoloft in the past.   HISTORY:  Past Medical History:  Diagnosis Date   Asthma    Depression    Diverticulitis    Frequent headaches    GERD (gastroesophageal reflux disease)    History of kidney stones    History of stomach ulcers history of mouth ulcers   Hyperlipidemia    Hypertension    Urinary tract bacterial infections    Past Surgical History:  Procedure Laterality Date   ABDOMINAL HYSTERECTOMY  2005   Uterus only   APPENDECTOMY  1985   HERNIA REPAIR  2012   LEEP     TONSILLECTOMY     TONSILLECTOMY AND ADENOIDECTOMY  1989   Family History  Problem Relation Age of Onset   Arthritis Mother    Cancer  Mother        Breast Cancer   Heart disease Mother        Mitral valve disease   Hyperlipidemia Father    Heart disease Father    Stroke Father    Hypertension Father    Arthritis Maternal Grandmother    Mental illness Maternal Grandmother    Cancer Maternal Grandfather        Colon/ Prostate Cancer   Heart disease Maternal Grandfather    Stroke Maternal Grandfather    Diabetes Maternal Grandfather    Arthritis Paternal Grandmother    Hypertension Paternal Grandmother        renal artery stenosis   Heart attack Paternal Grandfather    Hypertension Sister    Heart disease Sister        tachycardia s/p ablation    Allergies: Sulfa antibiotics Current Outpatient Medications on File Prior to Visit  Medication Sig Dispense Refill   albuterol (VENTOLIN HFA) 108 (90 Base) MCG/ACT inhaler Inhale 2 puffs into the lungs every 6 (six) hours as needed for wheezing or shortness of breath. 1 each 0   busPIRone (BUSPAR) 7.5 MG tablet Take 7.5 mg by mouth 2 (two) times daily.     fluticasone (FLONASE) 50 MCG/ACT nasal spray Place 2 sprays into both nostrils daily. 16  g 2   lisinopril (ZESTRIL) 10 MG tablet Take 1 tablet by mouth daily. 90 tablet 0   No current facility-administered medications on file prior to visit.    Social History   Tobacco Use   Smoking status: Never   Smokeless tobacco: Never  Vaping Use   Vaping Use: Never used  Substance Use Topics   Alcohol use: Yes    Comment: occasionally   Drug use: No    Review of Systems  Constitutional:  Negative for chills, fever and unexpected weight change.  HENT:  Positive for hearing loss. Negative for congestion and ear pain.   Respiratory:  Negative for cough.   Cardiovascular:  Negative for chest pain, palpitations and leg swelling.  Gastrointestinal:  Negative for nausea and vomiting.  Musculoskeletal:  Negative for arthralgias and myalgias.  Skin:  Negative for rash.  Neurological:  Positive for dizziness and numbness.  Negative for headaches.  Hematological:  Negative for adenopathy.  Psychiatric/Behavioral:  Negative for confusion and suicidal ideas. The patient is nervous/anxious.      Objective:    BP 124/82   Pulse 66   Temp (!) 96 F (35.6 C)   Ht 5' 2.01" (1.575 m)   Wt 142 lb 3.2 oz (64.5 kg)   SpO2 97%   BMI 26.00 kg/m  BP Readings from Last 3 Encounters:  05/12/21 124/82  04/19/21 118/74  11/16/20 116/78   Wt Readings from Last 3 Encounters:  05/12/21 142 lb 3.2 oz (64.5 kg)  04/19/21 138 lb 6.4 oz (62.8 kg)  11/16/20 153 lb (69.4 kg)    Physical Exam Vitals reviewed.  Constitutional:      Appearance: She is well-developed.  Eyes:     Conjunctiva/sclera: Conjunctivae normal.  Cardiovascular:     Rate and Rhythm: Normal rate and regular rhythm.     Pulses: Normal pulses.     Heart sounds: Normal heart sounds.  Pulmonary:     Effort: Pulmonary effort is normal.     Breath sounds: Normal breath sounds. No wheezing, rhonchi or rales.  Skin:    General: Skin is warm and dry.  Neurological:     Mental Status: She is alert.  Psychiatric:        Speech: Speech normal.        Behavior: Behavior normal.        Thought Content: Thought content normal.       Assessment & Plan:   Problem List Items Addressed This Visit       Cardiovascular and Mediastinum   Benign essential HTN    Chronic, stable without lisinopril.  We will continue to monitor.        Other   Depression with anxiety - Primary    Uncontrolled.  Awaiting psychiatry appointment and have sent a message to referral pending to check on status of this.  Patient has been on Zoloft in the past is comfortable and recently starting.  She is prescribed 1 starting at the lowest dose so I prescribed Zoloft 25 mg.  She will continue BuSpar as needed.  She will no longer participate in TMS as she is concerned this may have worsened her depression and caused hearing loss.Hearing loss has improved and will  continue to  monitor .  If this were to persist at follow up, would arrange ENT consult for further evaluation      Relevant Medications   sertraline (ZOLOFT) 50 MG tablet   Dizziness    Chronic,Uncontrolled.  Dizziness  not affected by changing positions.  She is no longer on lisinopril.  Patient is agreeable to  pursuing neuroimaging, ultrasound carotids.  Pending at this time.  Symptoms not entirely consistent with vertigo.  Consider physical therapy. Perform orthostatic blood pressure at follow up.       Relevant Orders   MR BRAIN W WO CONTRAST   MR ANGIO HEAD WO CONTRAST   US Carotid Duplex Bilateral     I am having Annette Gonzales start on sertraline. I am also having her maintain her fluticasone, albuterol, lisinopril, and busPIRone.   Meds ordered this encounter  Medications   sertraline (ZOLOFT) 50 MG tablet    Sig: Take 0.5 tablets (25 mg total) by mouth at bedtime.    Dispense:  90 tablet    Refill:  3    Order Specific Question:   Supervising Provider    Answer:   Sherlene Shams [[id number]]    Return precautions given.   Risks, benefits, and alternatives of the medications and treatment plan prescribed today were discussed, and patient expressed understanding.   Education regarding symptom management and diagnosis given to patient on AVS.  Continue to follow with Annette Grana, FNP for routine health maintenance.   Annette Gonzales and I agreed with plan.   Rennie Plowman, FNP

## 2021-05-24 ENCOUNTER — Other Ambulatory Visit: Payer: Self-pay

## 2021-05-24 ENCOUNTER — Ambulatory Visit
Admission: RE | Admit: 2021-05-24 | Discharge: 2021-05-24 | Disposition: A | Discharge status: Home / Self Care | Payer: BLUE CROSS/BLUE SHIELD | Source: Ambulatory Visit | Attending: Family | Admitting: Family

## 2021-05-24 DIAGNOSIS — R42 Dizziness and giddiness: Secondary | ICD-10-CM

## 2021-05-24 DIAGNOSIS — I6523 Occlusion and stenosis of bilateral carotid arteries: Secondary | ICD-10-CM | POA: Diagnosis not present

## 2021-05-24 MED ORDER — GADOBUTROL 1 MMOL/ML IV SOLN
6.0000 mL | Freq: Once | INTRAVENOUS | Status: AC | PRN
  Administered 2021-05-24: 6 mL via INTRAVENOUS

## 2021-06-01 ENCOUNTER — Encounter: Payer: Self-pay | Admitting: Family

## 2021-06-01 DIAGNOSIS — F418 Other specified anxiety disorders: Secondary | ICD-10-CM

## 2021-06-01 MED ORDER — SERTRALINE HCL 50 MG PO TABS: 50.0000 mg | ORAL_TABLET | Freq: Every day | ORAL | 3 refills | Status: DC

## 2021-06-21 ENCOUNTER — Ambulatory Visit: Payer: BLUE CROSS/BLUE SHIELD | Admitting: Family

## 2021-07-22 ENCOUNTER — Encounter: Payer: Self-pay | Admitting: Family

## 2021-07-23 ENCOUNTER — Encounter: Payer: Self-pay | Admitting: Family

## 2021-07-23 ENCOUNTER — Ambulatory Visit: Payer: BLUE CROSS/BLUE SHIELD

## 2021-07-23 ENCOUNTER — Telehealth (INDEPENDENT_AMBULATORY_CARE_PROVIDER_SITE_OTHER): Payer: BLUE CROSS/BLUE SHIELD | Admitting: Family

## 2021-07-23 ENCOUNTER — Other Ambulatory Visit: Payer: Self-pay | Admitting: Family

## 2021-07-23 ENCOUNTER — Other Ambulatory Visit: Payer: Self-pay

## 2021-07-23 VITALS — Ht 62.0 in | Wt 140.0 lb

## 2021-07-23 DIAGNOSIS — U071 COVID-19: Secondary | ICD-10-CM

## 2021-07-23 MED ORDER — MOLNUPIRAVIR EUA 200MG CAPSULE: 4.0000 | ORAL_CAPSULE | Freq: Two times a day (BID) | ORAL | 0 refills | Status: AC

## 2021-07-23 NOTE — Assessment & Plan Note (Signed)
Start on antiviral, pt has confirmed no chance of pregnancy.   Advised of CDC guidelines for self isolation/ ending isolation.  Advised of safe practice guidelines. Symptom Tier reviewed.  Encouraged to monitor for any worsening symptoms; watch for increased shortness of breath, weakness, and signs of dehydration. Advised when to seek emergency care.  Instructed to rest and hydrate well.  Advised to leave the house during recommended isolation period, only if it is necessary to seek medical care

## 2021-07-23 NOTE — Progress Notes (Signed)
MyChart Video Visit    Virtual Visit via Video Note   This visit type was conducted due to national recommendations for restrictions regarding the COVID-19 Pandemic (e.g. social distancing) in an effort to limit this patient's exposure and mitigate transmission in our community. This patient is at least at moderate risk for complications without adequate follow up. This format is felt to be most appropriate for this patient at this time. Physical exam was limited by quality of the video and audio technology used for the visit. CMA was able to get the patient set up on a video visit.  Patient location: Home. Patient and provider in visit Provider location: Office  I discussed the limitations of evaluation and management by telemedicine and the availability of in person appointments. The patient expressed understanding and agreed to proceed.  Visit Date: 07/23/2021  Today's healthcare provider: Mort Sawyers, FNP     Subjective:    Patient ID: Annette Gonzales, female    DOB: 22-Apr-1971, 50 y.o.   MRN: [id number]  Chief Complaint  Patient presents with   Covid Positive   Cough   Fever   Headache   Sore Throat   Nasal Congestion   Generalized Body Aches   loss of taste     Cough Associated symptoms include chills, a fever, headaches, myalgias and a sore throat. Pertinent negatives include no chest pain, ear pain, shortness of breath or wheezing.  Fever  Associated symptoms include congestion, coughing, headaches and a sore throat. Pertinent negatives include no chest pain, ear pain or wheezing.  Headache  Associated symptoms include coughing, a fever and a sore throat. Pertinent negatives include no ear pain.  Sore Throat  Associated symptoms include congestion, coughing and headaches. Pertinent negatives include no ear pain or shortness of breath.   50 y/o female recently found out she was covid positive 07/22/21.  Her son and husband are also positive.  Sx started  07/21/21. She had fever 103 F yesterday, c/o a very bad headache, sore throat, nasal congestion, loss of taste, body aches, and a wet productive cough. With coughing she feels like she is choking because it's very thick. Denies sob.   As far as antiviral needs, she is 100% not pregnant, she had partial hysterectomoy.   Past Medical History:  Diagnosis Date   Asthma    Depression    Diverticulitis    Frequent headaches    GERD (gastroesophageal reflux disease)    History of kidney stones    History of stomach ulcers history of mouth ulcers   Hyperlipidemia    Hypertension    Urinary tract bacterial infections     Past Surgical History:  Procedure Laterality Date   ABDOMINAL HYSTERECTOMY  2005   Uterus only   APPENDECTOMY  1985   HERNIA REPAIR  2012   LEEP     TONSILLECTOMY     TONSILLECTOMY AND ADENOIDECTOMY  1989    Family History  Problem Relation Age of Onset   Arthritis Mother    Cancer Mother        Breast Cancer   Heart disease Mother        Mitral valve disease   Hyperlipidemia Father    Heart disease Father    Stroke Father    Hypertension Father    Arthritis Maternal Grandmother    Mental illness Maternal Grandmother    Cancer Maternal Grandfather        Colon/ Prostate Cancer   Heart disease Maternal  Grandfather    Stroke Maternal Grandfather    Diabetes Maternal Grandfather    Arthritis Paternal Grandmother    Hypertension Paternal Grandmother        renal artery stenosis   Heart attack Paternal Grandfather    Hypertension Sister    Heart disease Sister        tachycardia s/p ablation    Social History   Socioeconomic History   Marital status: Married    Spouse name: Not on file   Number of children: Not on file   Years of education: Not on file   Highest education level: Not on file  Occupational History   Not on file  Tobacco Use   Smoking status: Never   Smokeless tobacco: Never  Vaping Use   Vaping Use: Never used  Substance and  Sexual Activity   Alcohol use: Yes    Comment: occasionally   Drug use: No   Sexual activity: Not on file  Other Topics Concern   Not on file  Social History Narrative   Married   Employed as Pt Care Coordinator   Some College    2 children   Social Determinants of Health   Financial Resource Strain: Not on file  Food Insecurity: Not on file  Transportation Needs: Not on file  Physical Activity: Not on file  Stress: Not on file  Social Connections: Not on file  Intimate Partner Violence: Not on file    Outpatient Medications Prior to Visit  Medication Sig Dispense Refill   albuterol (VENTOLIN HFA) 108 (90 Base) MCG/ACT inhaler Inhale 2 puffs into the lungs every 6 (six) hours as needed for wheezing or shortness of breath. 1 each 0   busPIRone (BUSPAR) 7.5 MG tablet Take 7.5 mg by mouth 2 (two) times daily.     fluticasone (FLONASE) 50 MCG/ACT nasal spray Place 2 sprays into both nostrils daily. 16 g 2   lisinopril (ZESTRIL) 10 MG tablet Take 1 tablet by mouth daily. 90 tablet 0   sertraline (ZOLOFT) 50 MG tablet Take 1 tablet (50 mg total) by mouth at bedtime. 90 tablet 3   No facility-administered medications prior to visit.    Allergies  Allergen Reactions   Sulfa Antibiotics Other (See Comments)    Nausea and hot flashes    Review of Systems  Constitutional:  Positive for chills and fever.  HENT:  Positive for congestion and sore throat. Negative for ear pain.   Respiratory:  Positive for cough and sputum production. Negative for shortness of breath and wheezing.   Cardiovascular:  Negative for chest pain.  Musculoskeletal:  Positive for myalgias.  Neurological:  Positive for headaches.  All other systems reviewed and are negative.     Objective:    Physical Exam Constitutional:      General: She is not in acute distress.    Appearance: She is well-developed. She is not ill-appearing, toxic-appearing or diaphoretic.  HENT:     Head: Normocephalic.   Pulmonary:     Effort: Pulmonary effort is normal.  Neurological:     Mental Status: She is alert.  Psychiatric:        Mood and Affect: Mood normal.        Speech: Speech normal.        Behavior: Behavior normal.    Ht 5\' 2"  (1.575 m)    Wt 140 lb (63.5 kg)    BMI 25.61 kg/m  Wt Readings from Last 3 Encounters:  07/23/21 140 lb (  63.5 kg)  05/12/21 142 lb 3.2 oz (64.5 kg)  04/19/21 138 lb 6.4 oz (62.8 kg)       Assessment & Plan:   Problem List Items Addressed This Visit       Other   COVID-19 - Primary    Start on antiviral, pt has confirmed no chance of pregnancy.   Advised of CDC guidelines for self isolation/ ending isolation.  Advised of safe practice guidelines. Symptom Tier reviewed.  Encouraged to monitor for any worsening symptoms; watch for increased shortness of breath, weakness, and signs of dehydration. Advised when to seek emergency care.  Instructed to rest and hydrate well.  Advised to leave the house during recommended isolation period, only if it is necessary to seek medical care       Relevant Medications   molnupiravir EUA (LAGEVRIO) 200 mg CAPS capsule    I am having Mary-Ann B. Doxtater start on molnupiravir EUA. I am also having her maintain her fluticasone, albuterol, lisinopril, busPIRone, and sertraline.  Meds ordered this encounter  Medications   molnupiravir EUA (LAGEVRIO) 200 mg CAPS capsule    Sig: Take 4 capsules (800 mg total) by mouth 2 (two) times daily for 5 days.    Dispense:  40 capsule    Refill:  0    Order Specific Question:   Supervising Provider    Answer:   BEDSOLE, AMY E [[id number]]    I discussed the assessment and treatment plan with the patient. The patient was provided an opportunity to ask questions and all were answered. The patient agreed with the plan and demonstrated an understanding of the instructions.   The patient was advised to call back or seek an in-person evaluation if the symptoms worsen or if the condition  fails to improve as anticipated.  I provided 18 minutes of face-to-face time during this encounter.   Mort Sawyers, FNP [facility] [place] [phone] (phone) [phone] (fax)  [facility]

## 2021-07-23 NOTE — Patient Instructions (Signed)
Sent antiviral to pharmacy. Please start and take as directed.  Be sure to rest, drink plenty of fluids, and use tylenol or ibuprofen as needed for pain. Follow up if fever >101, if symptoms worsen or if symptoms are not improved in 3 days.   You can try a few things over the counter to help with your symptoms including:  Cough: Delsym or Robitussin (get the off brand, works just as well) Chest Congestion: Mucinex (plain, not DM) Nasal Congestion/Ear Pressure/Sinus Pressure: Try using Flonase (fluticasone) nasal spray. This can be purchased over the counter. Body aches, fevers, headache: Ibuprofen (not to exceed 2400 mg in 24 hours) or Acetaminophen-Tylenol (not to exceed 3000 mg in 24 hours) Runny Nose/Throat Drainage/Sneezing/Itchy or Watery Eyes: An antihistamine such as Zyrtec, Claritin, Xyzal, Allegra  Your COVID19 PCR test is POSITIVE.  Let us know right away if any worsening shortness of breath or persistent productive cough or fever.   Follow these current CDC guidelines for self-isolation:  - Stay home for 5 days, starting the day after your symptoms (The first day is really day 0). - If you have no symptoms or your symptoms are resolving after 5 days, you can leave your house. - Continue to wear a mask around others for 5 additional days. **If you have a fever, continue to stay home until your fever resolves for 24 hours without fever-reducing medications.**

## 2021-07-24 ENCOUNTER — Telehealth: Payer: BLUE CROSS/BLUE SHIELD | Admitting: Nurse Practitioner

## 2021-07-24 DIAGNOSIS — J029 Acute pharyngitis, unspecified: Secondary | ICD-10-CM

## 2021-07-24 MED ORDER — LIDOCAINE VISCOUS HCL 2 % MT SOLN: 15.0000 mL | OROMUCOSAL | 0 refills | Status: DC | PRN

## 2021-07-24 NOTE — Progress Notes (Signed)
Virtual Visit Consent   [name], you are scheduled for a virtual visit with Mary-Margaret Daphine Deutscher, FNP, a [facility] provider, today.     Just as with appointments in the office, your consent must be obtained to participate.  Your consent will be active for this visit and any virtual visit you may have with one of our providers in the next 365 days.     If you have a MyChart account, a copy of this consent can be sent to you electronically.  All virtual visits are billed to your insurance company just like a traditional visit in the office.    As this is a virtual visit, video technology does not allow for your provider to perform a traditional examination.  This may limit your provider's ability to fully assess your condition.  If your provider identifies any concerns that need to be evaluated in person or the need to arrange testing (such as labs, EKG, etc.), we will make arrangements to do so.     Although advances in technology are sophisticated, we cannot ensure that it will always work on either your end or our end.  If the connection with a video visit is poor, the visit may have to be switched to a telephone visit.  With either a video or telephone visit, we are not always able to ensure that we have a secure connection.     I need to obtain your verbal consent now.   Are you willing to proceed with your visit today? YES   Annette Gonzales has provided verbal consent on 07/24/2021 for a virtual visit (video or telephone).   Mary-Margaret Daphine Deutscher, FNP   Date: 07/24/2021 1:40 PM   Virtual Visit via Video Note   I, Mary-Margaret Daphine Deutscher, connected with Annette Gonzales ([id number], Jun 26, 1971) on 07/24/21 at  1:45 PM EST by a video-enabled telemedicine application and verified that I am speaking with the correct person using two identifiers.  Location: Patient: Virtual Visit Location Patient: Home Provider: Virtual Visit Location Provider: Mobile   I discussed the  limitations of evaluation and management by telemedicine and the availability of in person appointments. The patient expressed understanding and agreed to proceed.    History of Present Illness: Annette Gonzales is a 50 y.o. who identifies as a female who was assigned female at birth, and is being seen today for pharyngitis.  HPI: Sore Throat  This is a new (tested positive for covid on thursday. started antviral yesterday) problem. The current episode started in the past 7 days. The problem has been gradually worsening (sore throat has worsened). Neither side of throat is experiencing more pain than the other. The maximum temperature recorded prior to her arrival was 100.4 - 100.9 F. The fever has been present for 1 to 2 days. The pain is at a severity of 10/10. Associated symptoms include congestion, coughing, headaches and trouble swallowing. She has had no exposure to strep or mono. She has tried acetaminophen (is on molnupivir, mucinex) for the symptoms. The treatment provided mild relief.   Review of Systems  HENT:  Positive for congestion and trouble swallowing.   Respiratory:  Positive for cough.   Neurological:  Positive for headaches.   Problems:  Patient Active Problem List   Diagnosis Date Noted   COVID-19 07/23/2021   Dizziness 10/30/2020   Herpes zoster without complication 04/27/2020   Flank pain 05/09/2019   Allergic rhinitis 09/17/2018   Screen for colon cancer 04/03/2017  Stress incontinence of urine 07/07/2016   Shoulder pain 06/20/2016   History of asthma 04/24/2015   Depression with anxiety 04/24/2015   Arthralgia of both knees 04/24/2015   Diverticulosis of colon without hemorrhage 04/24/2015   Headache 04/24/2015   GERD (gastroesophageal reflux disease) 04/24/2015   History of gastric ulcer 04/24/2015   Benign essential HTN 04/24/2015   HLD (hyperlipidemia) 04/24/2015   History of nephrolithiasis 04/24/2015   Overweight 04/24/2015   Encounter to establish  care 04/24/2015    Allergies:  Allergies  Allergen Reactions   Sulfa Antibiotics Other (See Comments)    Nausea and hot flashes   Medications:  Current Outpatient Medications:    albuterol (VENTOLIN HFA) 108 (90 Base) MCG/ACT inhaler, Inhale 2 puffs into the lungs every 6 (six) hours as needed for wheezing or shortness of breath., Disp: 1 each, Rfl: 0   busPIRone (BUSPAR) 7.5 MG tablet, Take 7.5 mg by mouth 2 (two) times daily., Disp: , Rfl:    fluticasone (FLONASE) 50 MCG/ACT nasal spray, Place 2 sprays into both nostrils daily., Disp: 16 g, Rfl: 2   molnupiravir EUA (LAGEVRIO) 200 mg CAPS capsule, Take 4 capsules (800 mg total) by mouth 2 (two) times daily for 5 days., Disp: 40 capsule, Rfl: 0   sertraline (ZOLOFT) 50 MG tablet, Take 1 tablet (50 mg total) by mouth at bedtime., Disp: 90 tablet, Rfl: 3  Observations/Objective: Patient is well-developed, well-nourished in no acute distress.  Resting comfortably  at home.  Head is normocephalic, atraumatic.  No labored breathing.  Speech is clear and coherent with logical content.  Patient is alert and oriented at baseline.  Raspy voice No cough during visit  Assessment and Plan:  Annette Gonzales in today with chief complaint of Sore Throat   1. Pharyngitis, unspecified etiology Force fluids Motrin or tylenol OTC OTC decongestant Throat lozenges if help Continue molnupivir  - lidocaine (XYLOCAINE) 2 % solution; Use as directed 15 mLs in the mouth or throat as needed for mouth pain.  Dispense: 200 mL; Refill: 0    Follow Up Instructions: I discussed the assessment and treatment plan with the patient. The patient was provided an opportunity to ask questions and all were answered. The patient agreed with the plan and demonstrated an understanding of the instructions.  A copy of instructions were sent to the patient via MyChart.  The patient was advised to call back or seek an in-person evaluation if the symptoms worsen or if  the condition fails to improve as anticipated.  Time:  I spent 8 minutes with the patient via telehealth technology discussing the above problems/concerns.    Mary-Margaret Daphine Deutscher, FNP

## 2021-07-24 NOTE — Patient Instructions (Signed)
Pharyngitis ?Pharyngitis is a sore throat (pharynx). This is when there is redness, pain, and swelling in your throat. Most of the time, this condition gets better on its own. In some cases, you may need medicine. ?What are the causes? ?An infection from a virus. ?An infection from bacteria. ?Allergies. ?What increases the risk? ?Being 5-49 years old. ?Being in crowded environments. These include: ?Daycares. ?Schools. ?Dormitories. ?Living in a place with cold temperatures outside. ?Having a weakened disease-fighting (immune) system. ?What are the signs or symptoms? ?Symptoms may vary depending on the cause. Common symptoms include: ?Sore throat. ?Tiredness (fatigue). ?Low-grade fever. ?Stuffy nose. ?Cough. ?Headache. ?Other symptoms may include: ?Glands in the neck (lymph nodes) that are swollen. ?Skin rashes. ?Film on the throat or tonsils. This can be caused by an infection from bacteria. ?Vomiting. ?Red, itchy eyes. ?Loss of appetite. ?Joint pain and muscle aches. ?Tonsils that are temporarily bigger than usual (enlarged). ?How is this treated? ?Many times, treatment is not needed. This condition usually gets better in 3-4 days without treatment. ?If the infection is caused by a bacteria, you may be need to take antibiotics. ?Follow these instructions at home: ?Medicines ?Take over-the-counter and prescription medicines only as told by your doctor. ?If you were prescribed an antibiotic medicine, take it as told by your doctor. Do not stop taking the antibiotic even if you start to feel better. ?Use throat lozenges or sprays to soothe your throat as told by your doctor. ?Children can get pharyngitis. Do not give your child aspirin. ?Managing pain ?To help with pain, try: ?Sipping warm liquids, such as: ?Broth. ?Herbal tea. ?Warm water. ?Eating or drinking cold or frozen liquids, such as frozen ice pops. ?Rinsing your mouth (gargle) with a salt water mixture 3-4 times a day or as needed. ?To make salt water,  dissolve ?-1 tsp (3-6 g) of salt in 1 cup (237 mL) of warm water. ?Do not swallow this mixture. ?Sucking on hard candy or throat lozenges. ?Putting a cool-mist humidifier in your bedroom at night to moisten the air. ?Sitting in the bathroom with the door closed for 5-10 minutes while you run hot water in the shower. ? ?General instructions ? ?Do not smoke or use any products that contain nicotine or tobacco. If you need help quitting, ask your doctor. ?Rest as told by your doctor. ?Drink enough fluid to keep your pee (urine) pale yellow. ?How is this prevented? ?Wash your hands often for at least 20 seconds with soap and water. If soap and water are not available, use hand sanitizer. ?Do not touch your eyes, nose, or mouth with unwashed hands. Wash hands after touching these areas. ?Do not share cups or eating utensils. ?Avoid close contact with people who are sick. ?Contact a doctor if: ?You have large, tender lumps in your neck. ?You have a rash. ?You cough up green, yellow-brown, or bloody spit. ?Get help right away if: ?You have a stiff neck. ?You drool or cannot swallow liquids. ?You cannot drink or take medicines without vomiting. ?You have very bad pain that does not go away with medicine. ?You have problems breathing, and it is not from a stuffy nose. ?You have new pain and swelling in your knees, ankles, wrists, or elbows. ?These symptoms may be an emergency. Get help right away. Call your local emergency services (911 in the [place] ?Do not wait to see if the symptoms will go away. ?Do not drive yourself to the hospital. ?Summary ?Pharyngitis is a sore throat (pharynx). This is   when there is redness, pain, and swelling in your throat. ?Most of the time, pharyngitis gets better on its own. Sometimes, you may need medicine. ?If you were prescribed an antibiotic medicine, take it as told by your doctor. Do not stop taking the antibiotic even if you start to feel better. ?This information is not intended to  replace advice given to you by your health care provider. Make sure you discuss any questions you have with your health care provider. ?Document Revised: 10/07/2020 Document Reviewed: 10/07/2020 ?Elsevier Patient Education ? 2022 Elsevier Inc. ? ?

## 2021-07-26 ENCOUNTER — Encounter: Payer: Self-pay | Admitting: Family

## 2021-07-26 ENCOUNTER — Telehealth: Payer: BLUE CROSS/BLUE SHIELD | Admitting: Physician Assistant

## 2021-07-26 DIAGNOSIS — U071 COVID-19: Secondary | ICD-10-CM

## 2021-07-26 MED ORDER — BENZONATATE 100 MG PO CAPS: 100.0000 mg | ORAL_CAPSULE | Freq: Three times a day (TID) | ORAL | 0 refills | Status: DC | PRN

## 2021-07-26 MED ORDER — ALBUTEROL SULFATE HFA 108 (90 BASE) MCG/ACT IN AERS: 2.0000 | INHALATION_SPRAY | Freq: Four times a day (QID) | RESPIRATORY_TRACT | 0 refills | Status: DC | PRN

## 2021-07-26 NOTE — Progress Notes (Signed)
E-Visit  for Positive Covid Test Result  We are sorry you are not feeling well. We are here to help!  You have tested positive for COVID-19, meaning that you were infected with the novel coronavirus and could give the virus to others.  It is vitally important that you stay home so you do not spread it to others.      Please continue isolation at home, for at least 10 days since the start of your symptoms and until you have had 24 hours with no fever (without taking a fever reducer) and with improving of symptoms.  If you have no symptoms but tested positive (or all symptoms resolve after 5 days and you have no fever) you can leave your house but continue to wear a mask around others for an additional 5 days. If you have a fever,continue to stay home until you have had 24 hours of no fever. Most cases improve 5-10 days from onset but we have seen a small number of patients who have gotten worse after the 10 days.  Please be sure to watch for worsening symptoms and remain taking the proper precautions.   Go to the nearest hospital ED for assessment if fever/cough/breathlessness are severe or illness seems like a threat to life.    The following symptoms may appear 2-14 days after exposure: Fever Cough Shortness of breath or difficulty breathing Chills Repeated shaking with chills Muscle pain Headache Sore throat New loss of taste or smell Fatigue Congestion or runny nose Nausea or vomiting Diarrhea  You have been enrolled in MyChart Home Monitoring for COVID-19. Daily you will receive a questionnaire within the MyChart website. Our COVID-19 response team will be monitoring your responses daily.  You can use medication such as prescription cough medication called Tessalon Perles 100 mg. You may take 1-2 capsules every 8 hours as needed for cough and  prescription inhaler called Albuterol MDI 90 mcg /actuation 2 puffs every 4 hours as needed for shortness of breath, wheezing, cough.  Continue the antiviral you were placed on as well.   You may also take acetaminophen (Tylenol) as needed for fever.  HOME CARE: Only take medications as instructed by your medical team. Drink plenty of fluids and get plenty of rest. A steam or ultrasonic humidifier can help if you have congestion.   GET HELP RIGHT AWAY IF YOU HAVE EMERGENCY WARNING SIGNS.  Call 911 or proceed to your closest emergency facility if: You develop worsening high fever. Trouble breathing Bluish lips or face Persistent pain or pressure in the chest New confusion Inability to wake or stay awake You cough up blood. Your symptoms become more severe Inability to hold down food or fluids  This list is not all possible symptoms. Contact your medical provider for any symptoms that are severe or concerning to you.    Your e-visit answers were reviewed by a board certified advanced clinical practitioner to complete your personal care plan.  Depending on the condition, your plan could have included both over the counter or prescription medications.  If there is a problem please reply once you have received a response from your provider.  Your safety is important to [place].  If you have drug allergies check your prescription carefully.    You can use MyChart to ask questions about today's visit, request a non-urgent call back, or ask for a work or school excuse for 24 hours related to this e-Visit. If it has been greater than 24  hours you will need to follow up with your provider, or enter a new e-Visit to address those concerns. You will get an e-mail in the next two days asking about your experience.  I hope that your e-visit has been valuable and will speed your recovery. Thank you for using e-visits.

## 2021-07-26 NOTE — Progress Notes (Signed)
I have spent 5 minutes in review of e-visit questionnaire, review and updating patient chart, medical decision making and response to patient.   Julanne Schlueter Cody Laquia Rosano, PA-C    

## 2021-07-27 ENCOUNTER — Other Ambulatory Visit: Payer: Self-pay | Admitting: Family

## 2021-07-27 DIAGNOSIS — R059 Cough, unspecified: Secondary | ICD-10-CM

## 2021-07-27 MED ORDER — GUAIFENESIN ER 600 MG PO TB12: 1200.0000 mg | ORAL_TABLET | Freq: Two times a day (BID) | ORAL | 3 refills | Status: DC

## 2021-07-28 ENCOUNTER — Other Ambulatory Visit: Payer: Self-pay

## 2021-07-28 ENCOUNTER — Other Ambulatory Visit: Payer: Self-pay | Admitting: Family

## 2021-07-28 DIAGNOSIS — U071 COVID-19: Secondary | ICD-10-CM

## 2021-07-28 MED ORDER — HYDROCOD POLST-CPM POLST ER 10-8 MG/5ML PO SUER: 5.0000 mL | Freq: Every evening | ORAL | 0 refills | Status: DC | PRN

## 2021-07-28 NOTE — Telephone Encounter (Signed)
I had already forwarded this conversation to you then patient sent message in regards to cough. Not sure if you are willing to prescribe anything since she was not seen by you but had VV with Tabitha 12/30/2 then 07/26/21?

## 2021-07-29 NOTE — Telephone Encounter (Signed)
Called and spoke with pt, she did get rx for cough medication last night that allowed her to sleep and she states she is 'feeling much better'. Improvement with sob and also cough. At this time she does not feel she needs anything additional.

## 2021-08-13 ENCOUNTER — Other Ambulatory Visit: Payer: Self-pay

## 2021-08-13 ENCOUNTER — Encounter: Payer: Self-pay | Admitting: Family

## 2021-08-13 DIAGNOSIS — F418 Other specified anxiety disorders: Secondary | ICD-10-CM

## 2021-08-13 MED ORDER — LISINOPRIL 10 MG PO TABS: 10.0000 mg | ORAL_TABLET | Freq: Every day | ORAL | 1 refills | Status: DC

## 2021-08-13 MED ORDER — SERTRALINE HCL 50 MG PO TABS: 50.0000 mg | ORAL_TABLET | Freq: Every day | ORAL | 3 refills | Status: DC

## 2021-08-25 DIAGNOSIS — N329 Bladder disorder, unspecified: Secondary | ICD-10-CM | POA: Diagnosis not present

## 2021-08-29 ENCOUNTER — Telehealth: Payer: BLUE CROSS/BLUE SHIELD | Admitting: Nurse Practitioner

## 2021-08-29 DIAGNOSIS — J111 Influenza due to unidentified influenza virus with other respiratory manifestations: Secondary | ICD-10-CM

## 2021-08-30 MED ORDER — OSELTAMIVIR PHOSPHATE 75 MG PO CAPS: 75.0000 mg | ORAL_CAPSULE | Freq: Two times a day (BID) | ORAL | 0 refills | Status: AC

## 2021-08-30 NOTE — Progress Notes (Signed)
E visit for Flu like symptoms °  °We are sorry that you are not feeling well.  Here is how we plan to help! °Based on what you have shared with me it looks like you may have a respiratory virus that may be influenza. ° °Influenza or “the flu” is   an infection caused by a respiratory virus. The flu virus is highly contagious and persons who did not receive their yearly flu vaccination may “catch” the flu from close contact. ° °We have anti-viral medications to treat the viruses that cause this infection. They are not a “cure” and only shorten the course of the infection. These prescriptions are most effective when they are given within the first 2 days of “flu” symptoms. Antiviral medication are indicated if you have a high risk of complications from the flu. You should  also consider an antiviral medication if you are in close contact with someone who is at risk. These medications can help patients avoid complications from the flu  but have side effects that you should know. Possible side effects from Tamiflu or oseltamivir include nausea, vomiting, diarrhea, dizziness, headaches, eye redness, sleep problems or other respiratory symptoms. °You should not take Tamiflu if you have an allergy to oseltamivir or any to the ingredients in Tamiflu. ° °Based upon your symptoms and potential risk factors I have prescribed Oseltamivir (Tamiflu).  It has been sent to your designated pharmacy.  You will take one 75 mg capsule orally twice a day for the next 5 days. ° °ANYONE WHO HAS FLU SYMPTOMS SHOULD: °Stay home. The flu is highly contagious and going out or to work exposes others! °Be sure to drink plenty of fluids. Water is fine as well as fruit juices, sodas and electrolyte beverages. You may want to stay away from caffeine or alcohol. If you are nauseated, try taking small sips of liquids. How do you know if you are getting enough fluid? Your urine should be a pale yellow or almost colorless. °Get rest. °Taking a steamy  shower or using a humidifier may help nasal congestion and ease sore throat pain. Using a saline nasal spray works much the same way. °Cough drops, hard candies and sore throat lozenges may ease your cough. °Line up a caregiver. Have someone check on you regularly. ° ° °GET HELP RIGHT AWAY IF: °You cannot keep down liquids or your medications. °You become short of breath °Your fell like you are going to pass out or loose consciousness. °Your symptoms persist after you have completed your treatment plan °MAKE SURE YOU  °Understand these instructions. °Will watch your condition. °Will get help right away if you are not doing well or get worse. ° °Your e-visit answers were reviewed by a board certified advanced clinical practitioner to complete your personal care plan.  Depending on the condition, your plan could have included both over the counter or prescription medications. ° °If there is a problem please reply  once you have received a response from your provider. ° °Your safety is important to us.  If you have drug allergies check your prescription carefully.   ° °You can use MyChart to ask questions about today’s visit, request a non-urgent call back, or ask for a work or school excuse for 24 hours related to this e-Visit. If it has been greater than 24 hours you will need to follow up with your provider, or enter a new e-Visit to address those concerns. ° °You will get an e-mail in the next   two days asking about your experience.  I hope that your e-visit has been valuable and will speed your recovery. Thank you for using e-visits.   Meds ordered this encounter  Medications   oseltamivir (TAMIFLU) 75 MG capsule    Sig: Take 1 capsule (75 mg total) by mouth 2 (two) times daily for 5 days.    Dispense:  10 capsule    Refill:  0     I spent approximately 7 minutes reviewing the patient's history, current symptoms and coordinating their plan of care today.   

## 2021-09-21 ENCOUNTER — Other Ambulatory Visit: Payer: Self-pay | Admitting: Family

## 2021-09-21 DIAGNOSIS — Z1231 Encounter for screening mammogram for malignant neoplasm of breast: Secondary | ICD-10-CM

## 2021-09-23 ENCOUNTER — Other Ambulatory Visit: Payer: Self-pay

## 2021-09-23 ENCOUNTER — Ambulatory Visit
Admission: RE | Admit: 2021-09-23 | Discharge: 2021-09-23 | Disposition: A | Discharge status: Home / Self Care | Payer: BLUE CROSS/BLUE SHIELD | Source: Ambulatory Visit | Attending: Family | Admitting: Family

## 2021-09-23 DIAGNOSIS — Z1231 Encounter for screening mammogram for malignant neoplasm of breast: Secondary | ICD-10-CM

## 2021-12-21 ENCOUNTER — Encounter: Payer: Self-pay | Admitting: Family

## 2021-12-23 DIAGNOSIS — F4001 Agoraphobia with panic disorder: Secondary | ICD-10-CM | POA: Diagnosis not present

## 2021-12-23 DIAGNOSIS — F4312 Post-traumatic stress disorder, chronic: Secondary | ICD-10-CM | POA: Diagnosis not present

## 2021-12-23 DIAGNOSIS — F411 Generalized anxiety disorder: Secondary | ICD-10-CM | POA: Diagnosis not present

## 2021-12-23 DIAGNOSIS — F331 Major depressive disorder, recurrent, moderate: Secondary | ICD-10-CM | POA: Diagnosis not present

## 2021-12-27 ENCOUNTER — Ambulatory Visit: Payer: Self-pay | Admitting: Family

## 2021-12-30 ENCOUNTER — Other Ambulatory Visit: Payer: Self-pay | Admitting: Family

## 2021-12-30 ENCOUNTER — Ambulatory Visit
Admission: RE | Admit: 2021-12-30 | Discharge: 2021-12-30 | Disposition: A | Discharge status: Home / Self Care | Payer: BLUE CROSS/BLUE SHIELD | Source: Ambulatory Visit | Attending: Family | Admitting: Family

## 2021-12-30 ENCOUNTER — Ambulatory Visit: Payer: BLUE CROSS/BLUE SHIELD | Admitting: Family

## 2021-12-30 ENCOUNTER — Encounter: Payer: Self-pay | Admitting: Family

## 2021-12-30 DIAGNOSIS — F418 Other specified anxiety disorders: Secondary | ICD-10-CM | POA: Diagnosis not present

## 2021-12-30 DIAGNOSIS — R1013 Epigastric pain: Secondary | ICD-10-CM

## 2021-12-30 DIAGNOSIS — N200 Calculus of kidney: Secondary | ICD-10-CM | POA: Diagnosis not present

## 2021-12-30 DIAGNOSIS — K5730 Diverticulosis of large intestine without perforation or abscess without bleeding: Secondary | ICD-10-CM | POA: Diagnosis not present

## 2021-12-30 DIAGNOSIS — K760 Fatty (change of) liver, not elsewhere classified: Secondary | ICD-10-CM | POA: Diagnosis not present

## 2021-12-30 DIAGNOSIS — R079 Chest pain, unspecified: Secondary | ICD-10-CM | POA: Diagnosis not present

## 2021-12-30 LAB — COMPREHENSIVE METABOLIC PANEL
ALT: 17 U/L (ref 0–35)
AST: 19 U/L (ref 0–37)
Albumin: 4.3 g/dL (ref 3.5–5.2)
Alkaline Phosphatase: 70 U/L (ref 39–117)
BUN: 16 mg/dL (ref 6–23)
CO2: 26 mEq/L (ref 19–32)
Calcium: 9 mg/dL (ref 8.4–10.5)
Chloride: 106 mEq/L (ref 96–112)
Creatinine, Ser: 0.75 mg/dL (ref 0.40–1.20)
GFR: 92.83 mL/min (ref >60.00)
Glucose, Bld: 93 mg/dL (ref 70–99)
Potassium: 4.5 mEq/L (ref 3.5–5.1)
Sodium: 140 mEq/L (ref 135–145)
Total Bilirubin: 0.5 mg/dL (ref 0.2–1.2)
Total Protein: 6.8 g/dL (ref 6.0–8.3)

## 2021-12-30 LAB — CBC WITH DIFFERENTIAL/PLATELET
Basophils Absolute: 0.1 10*3/uL (ref 0.0–0.1)
Basophils Relative: 1.9 % (ref 0.0–3.0)
Eosinophils Absolute: 0.4 10*3/uL (ref 0.0–0.7)
Eosinophils Relative: 7.8 % — ABNORMAL HIGH (ref 0.0–5.0)
HCT: 41.6 % (ref 36.0–46.0)
Hemoglobin: 13.8 g/dL (ref 12.0–15.0)
Lymphocytes Relative: 29.2 % (ref 12.0–46.0)
Lymphs Abs: 1.6 10*3/uL (ref 0.7–4.0)
MCHC: 33.2 g/dL (ref 30.0–36.0)
MCV: 87.8 fl (ref 78.0–100.0)
Monocytes Absolute: 0.3 10*3/uL (ref 0.1–1.0)
Monocytes Relative: 6 % (ref 3.0–12.0)
Neutro Abs: 3.1 10*3/uL (ref 1.4–7.7)
Neutrophils Relative %: 55.1 % (ref 43.0–77.0)
Platelets: 310 10*3/uL (ref 150.0–400.0)
RBC: 4.74 Mil/uL (ref 3.87–5.11)
RDW: 14.6 % (ref 11.5–15.5)
WBC: 5.6 10*3/uL (ref 4.0–10.5)

## 2021-12-30 LAB — LIPASE: Lipase: 38 U/L (ref 11.0–59.0)

## 2021-12-30 LAB — HEMOGLOBIN A1C: Hgb A1c MFr Bld: 5.7 % (ref 4.6–6.5)

## 2021-12-30 MED ORDER — PANTOPRAZOLE SODIUM 20 MG PO TBEC
20.0000 mg | DELAYED_RELEASE_TABLET | Freq: Every day | ORAL | 2 refills | Status: DC
Start: 2021-12-30 — End: 2021-12-31

## 2021-12-30 MED ORDER — SERTRALINE HCL 100 MG PO TABS: 100.0000 mg | ORAL_TABLET | Freq: Every day | ORAL | 1 refills | Status: DC

## 2021-12-30 MED ORDER — IOHEXOL 300 MG/ML  SOLN
100.0000 mL | Freq: Once | INTRAMUSCULAR | Status: AC | PRN
  Administered 2021-12-30: 100 mL via INTRAVENOUS

## 2021-12-30 NOTE — Progress Notes (Signed)
Subjective:    Patient ID: Annette Gonzales, female    DOB: June 17, 1971, 51 y.o.   MRN: [id number]  CC: Annette Gonzales is a 51 y.o. female who presents today for an acute visit.    HPI: Accompanied by husband today  Complains of epigastric pain x 6 weeks, comes and goes. She doesnt have symptom today.  Pain is worse after eating 'something fatty' such as shrimp Feels better if she lays down.  Pain doesn't feel like kidney stone.  About 4 weeks ago, she did felt nauseated and vomited at this time. Denies vomiting, dysuria, fever, diarrhea, constipation.   She also complains of  episode left sided chest pain  which she has had 'off and on' x 4-6 weeks.  Chest pain is not exertional.  Occurred before dinner 6pm and she was siting outside when her dogs. Last episode was 2 days ago . Lasted for 20 minutes and then 'slowly' resolved. Pain radiated to left arm. She felt dizzy at that time.  She questioned if anxiety and she has since increase zoloft to 100mg  one week ago. CP has improved and no severe CP today.    She is doing yard work, house work, appro 10,000 steps per day.  No associated vision changes, ha, palpitations.   She is taking advil without relief. Not using advil daily.  Drinks one glass of wine 2-3 /week.     Compliant lisinopril 10 mg   strong family history of heart disease  Exercise stress test appears done on 05/28/2019  Dr. 13/09/2018.  Unable to see interpretation Consult Dr. Okey Dupre 05/03/2016 for chest pain.  At that time he started carvedilol 3.125 mg twice daily.  Significant renal artery stenosis seen renal artery duplex 05/13/2016  no h/o liver disease.  H/o renal stone, diverticulitis, left ovarian cyst History of hysterectomy, appendectomy Ct a/p 05/09/19  She is due colonoscopy  Depression and anxiety- she is compliant with zoloft 50mg , buspar 7.5mg  BID She is following with [facility] whom started trazodone 25mg .  Psychiatry also increased Zoloft to 100  mg  HISTORY:  Past Medical History:  Diagnosis Date   Asthma    Depression    Diverticulitis    Frequent headaches    GERD (gastroesophageal reflux disease)    History of kidney stones    History of stomach ulcers history of mouth ulcers   Hyperlipidemia    Hypertension    Urinary tract bacterial infections    Past Surgical History:  Procedure Laterality Date   ABDOMINAL HYSTERECTOMY  2005   Uterus only   APPENDECTOMY  1985   HERNIA REPAIR  2012   LEEP     TONSILLECTOMY     TONSILLECTOMY AND ADENOIDECTOMY  1989   Family History  Problem Relation Age of Onset   Arthritis Mother    Cancer Mother        Breast Cancer   Heart disease Mother        Mitral valve disease   Hyperlipidemia Father    Heart disease Father    Stroke Father    Hypertension Father    Arthritis Maternal Grandmother    Mental illness Maternal Grandmother    Cancer Maternal Grandfather        Colon/ Prostate Cancer   Heart disease Maternal Grandfather    Stroke Maternal Grandfather    Diabetes Maternal Grandfather    Arthritis Paternal Grandmother    Hypertension Paternal Grandmother        renal  artery stenosis   Heart attack Paternal Grandfather    Hypertension Sister    Heart disease Sister        tachycardia s/p ablation    Allergies: Sulfa antibiotics Current Outpatient Medications on File Prior to Visit  Medication Sig Dispense Refill   montelukast (SINGULAIR) 10 MG tablet Take 10 mg by mouth at bedtime.     traZODone (DESYREL) 50 MG tablet Take 25 mg by mouth at bedtime.     albuterol (VENTOLIN HFA) 108 (90 Base) MCG/ACT inhaler Inhale 2 puffs into the lungs every 6 (six) hours as needed for wheezing or shortness of breath. 8 g 0   benzonatate (TESSALON) 100 MG capsule Take 1 capsule (100 mg total) by mouth 3 (three) times daily as needed for cough. 30 capsule 0   chlorpheniramine-HYDROcodone (TUSSIONEX PENNKINETIC ER) 10-8 MG/5ML SUER Take 5 mLs by mouth at bedtime as needed for  cough. 70 mL 0   fluticasone (FLONASE) 50 MCG/ACT nasal spray Place 2 sprays into both nostrils daily. 16 g 2   guaiFENesin (MUCINEX) 600 MG 12 hr tablet Take 2 tablets (1,200 mg total) by mouth 2 (two) times daily. 28 tablet 3   lidocaine (XYLOCAINE) 2 % solution Use as directed 15 mLs in the mouth or throat as needed for mouth pain. 200 mL 0   lisinopril (ZESTRIL) 10 MG tablet Take 1 tablet (10 mg total) by mouth daily. 90 tablet 1   No current facility-administered medications on file prior to visit.    Social History   Tobacco Use   Smoking status: Never   Smokeless tobacco: Never  Vaping Use   Vaping Use: Never used  Substance Use Topics   Alcohol use: Yes    Comment: occasionally   Drug use: No    Review of Systems  Constitutional:  Negative for chills and fever.  Respiratory:  Negative for cough and shortness of breath.   Cardiovascular:  Positive for chest pain. Negative for palpitations and leg swelling.  Gastrointestinal:  Positive for abdominal pain. Negative for nausea (resolved) and vomiting.      Objective:    BP 118/68 (BP Location: Left Arm, Patient Position: Sitting, Cuff Size: Small)   Pulse 93   Temp 98 F (36.7 C) (Oral)   Ht 5\' 3"  (1.6 m)   Wt 127 lb (57.6 kg)   SpO2 97%   BMI 22.50 kg/m    Physical Exam Cardiovascular:     Comments: No chest pain with deep palpation of chest wall.       Assessment & Plan:   Problem List Items Addressed This Visit       Other   Chest pain    Chest pain has improved with increase of Zoloft to 100 mg.  Chest pain is nonexertional.  EKG shows normal sinus rhythm without acute changes from prior EKG May 03, 2016.  Patient does however have a strong family history of heart disease.  Urgent referral placed to cardiology for further evaluation and consideration of CT calcium score.  Advised patient chest pain were to recur she would need to go immediately to emergency room for further evaluation serial EKG  monitoring and cardiac enzyme monitoring.  Patient and husband verbalized understanding      Relevant Orders   EKG 12-Lead (Completed)   Ambulatory referral to Cardiology   Depression with anxiety   Relevant Medications   traZODone (DESYREL) 50 MG tablet   sertraline (ZOLOFT) 100 MG tablet   Epigastric pain  Presentation is most consistent cholelithiasis, GERD.  Differential also include gastroparesis.  Pending labs, CT abdomen pelvis particular with a history of diverticulitis, renal stone though I think this diagnosis would be less likely.  Advised her to start Protonix 20 mg every morning.  She will let me know how she is doing.      Relevant Medications   pantoprazole (PROTONIX) 20 MG tablet   Other Relevant Orders   Lipase   CBC with Differential/Platelet   Comprehensive metabolic panel   Hemoglobin A1c   CA 125   H. pylori breath test   CT ABDOMEN PELVIS W CONTRAST      I have discontinued Aaradhya B. Kloster's busPIRone. I have also changed her sertraline. Additionally, I am having her start on pantoprazole. Lastly, I am having her maintain her fluticasone, lidocaine, benzonatate, albuterol, guaiFENesin, chlorpheniramine-HYDROcodone, lisinopril, montelukast, and traZODone.   Meds ordered this encounter  Medications   pantoprazole (PROTONIX) 20 MG tablet    Sig: Take 1 tablet (20 mg total) by mouth daily. 1 hr prior to breakfast    Dispense:  30 tablet    Refill:  2    Order Specific Question:   Supervising Provider    Answer:   Duncan Dull L [[id number]]   sertraline (ZOLOFT) 100 MG tablet    Sig: Take 1 tablet (100 mg total) by mouth at bedtime. Taking 100 mg instead of the 50mg     Dispense:  90 tablet    Refill:  1    Order Specific Question:   Supervising Provider    Answer:   [[id number]]    Return precautions given.   Risks, benefits, and alternatives of the medications and treatment plan prescribed today were discussed, and patient expressed  understanding.   Education regarding symptom management and diagnosis given to patient on AVS.  Continue to follow with Sherlene Shams, FNP for routine health maintenance.   Allegra Grana and I agreed with plan.   Annette Limerick, FNP

## 2021-12-30 NOTE — Patient Instructions (Addendum)
Protonix every morning for breakfast. I have ordered CT abdomen pelvis.  Please keep cell phone on you today so I can call you with any critical results. As discussed it is  most important for you to stay vigilant as it relates chest pain, left arm pain.  If this pain were to recur, I would like for you to call 911 or go immediately to emergency room for further evaluation to ensure this is not cardiac in nature.  I want you to be safe  Food Choices for Gastroesophageal Reflux Disease, Adult When you have gastroesophageal reflux disease (GERD), the foods you eat and your eating habits are very important. Choosing the right foods can help ease the discomfort of GERD. Consider working with a dietitian to help you make healthy food choices. What are tips for following this plan? Reading food labels Look for foods that are low in saturated fat. Foods that have less than 5% of daily value (DV) of fat and 0 g of trans fats may help with your symptoms. Cooking Cook foods using methods other than frying. This may include baking, steaming, grilling, or broiling. These are all methods that do not need a lot of fat for cooking. To add flavor, try to use herbs that are low in spice and acidity. Meal planning  Choose healthy foods that are low in fat, such as fruits, vegetables, whole grains, low-fat dairy products, lean meats, fish, and poultry. Eat frequent, small meals instead of three large meals each day. Eat your meals slowly, in a relaxed setting. Avoid bending over or lying down until 2-3 hours after eating. Limit high-fat foods such as fatty meats or fried foods. Limit your intake of fatty foods, such as oils, butter, and shortening. Avoid the following as told by your health care provider: Foods that cause symptoms. These may be different for different people. Keep a food diary to keep track of foods that cause symptoms. Alcohol. Drinking large amounts of liquid with meals. Eating meals during the  2-3 hours before bed. Lifestyle Maintain a healthy weight. Ask your health care provider what weight is healthy for you. If you need to lose weight, work with your health care provider to do so safely. Exercise for at least 30 minutes on 5 or more days each week, or as told by your health care provider. Avoid wearing clothes that fit tightly around your waist and chest. Do not use any products that contain nicotine or tobacco. These products include cigarettes, chewing tobacco, and vaping devices, such as e-cigarettes. If you need help quitting, ask your health care provider. Sleep with the head of your bed raised. Use a wedge under the mattress or blocks under the bed frame to raise the head of the bed. Chew sugar-free gum after mealtimes. What foods should I eat?  Eat a healthy, well-balanced diet of fruits, vegetables, whole grains, low-fat dairy products, lean meats, fish, and poultry. Each person is different. Foods that may trigger symptoms in one person may not trigger any symptoms in another person. Work with your health care provider to identify foods that are safe for you. The items listed above may not be a complete list of recommended foods and beverages. Contact a dietitian for more information. What foods should I avoid? Limiting some of these foods may help manage the symptoms of GERD. Everyone is different. Consult a dietitian or your health care provider to help you identify the exact foods to avoid, if any. Fruits Any fruits prepared with  added fat. Any fruits that cause symptoms. For some people this may include citrus fruits, such as oranges, grapefruit, pineapple, and lemons. Vegetables Deep-fried vegetables. [place] fries. Any vegetables prepared with added fat. Any vegetables that cause symptoms. For some people, this may include tomatoes and tomato products, chili peppers, onions and garlic, and horseradish. Grains Pastries or quick breads with added fat. Meats and other  proteins High-fat meats, such as fatty beef or pork, hot dogs, ribs, ham, sausage, salami, and bacon. Fried meat or protein, including fried fish and fried chicken. Nuts and nut butters, in large amounts. Dairy Whole milk and chocolate milk. Sour cream. Cream. Ice cream. Cream cheese. Milkshakes. Fats and oils Butter. Margarine. Shortening. Ghee. Beverages Coffee and tea, with or without caffeine. Carbonated beverages. Sodas. Energy drinks. Fruit juice made with acidic fruits, such as orange or grapefruit. Tomato juice. Alcoholic drinks. Sweets and desserts Chocolate and cocoa. Donuts. Seasonings and condiments Pepper. Peppermint and spearmint. Added salt. Any condiments, herbs, or seasonings that cause symptoms. For some people, this may include curry, hot sauce, or vinegar-based salad dressings. The items listed above may not be a complete list of foods and beverages to avoid. Contact a dietitian for more information. Questions to ask your health care provider Diet and lifestyle changes are usually the first steps that are taken to manage symptoms of GERD. If diet and lifestyle changes do not improve your symptoms, talk with your health care provider about taking medicines. Where to find more information [employer] G[employer]intestinal [employer]: aboutgerd.org Summary When you have gastroesophageal reflux disease (GERD), food and lifestyle choices may be very helpful in easing the discomfort of GERD. Eat frequent, small meals instead of three large meals each day. Eat your meals slowly, in a relaxed setting. Avoid bending over or lying down until 2-3 hours after eating. Limit high-fat foods such as fatty meats or fried foods. This information is not intended to replace advice given to you by your health care provider. Make sure you discuss any questions you have with your health care provider. Document Revised: 01/20/2020 Document Reviewed: 01/20/2020 Elsevier Patient Education   2023 [employer] Heart Attack A heart attack occurs when blood and oxygen supply to the heart is cut off. A heart attack can cause damage to the heart that cannot be fixed. A heart attack is also called a myocardial infarction, or MI. If you think you are having a heart attack, do not wait to see if the symptoms will go away. Get medical help right away. What are the causes? This condition may be caused by: A fatty substance (plaque) in the blood vessels (arteries). This can block the flow of blood to the heart. A blood clot in the blood vessels that go to the heart. The blood clot blocks blood flow. An abnormal heartbeat. Some diseases, such as problems in red blood cells (anemia)orproblems in breathing (respiratory failure). Tightening (spasm) of a blood vessel that cuts off blood to the heart. A tear in a blood vessel of the heart. Other causes may include: Using drugs such as cocaine or methamphetamine. Low blood pressure. What increases the risk? Aging. The risk gets higher as you get older. Having a personal or family history of chest pain, heart attack, stroke, or narrowing of the arteries in the legs, arms, head, or stomach (peripheral vascular disease). Having taken chemotherapy or immune-suppressing medicines. Being female. Being overweight or obese. Having any of these conditions: High blood pressure. High cholesterol. Diabetes. Making lifestyle choices  such as: Drinking too much alcohol. Not getting regular exercise. Smoking. What are the signs or symptoms? Chest pain. It may feel like: Crushing or squeezing. Tightness, pressure, fullness, or heaviness. Pain in the arm, neck, jaw, back, or upper body. Heartburn. Upset stomach (indigestion). Shortness of breath. Feeling like you may vomit (nauseous). Cold sweats. Sudden light-headedness, dizziness, or passing out. Feeling tired. How is this treated? A heart attack must be treated as soon as possible. Treatment  may include: Medicines to: Break up or dissolve blood clots. Thin your blood and help prevent blood clots. Treat blood pressure. Improve blood flow to the heart. Reduce pain. Reduce cholesterol. Procedures to widen a blocked artery and keep it open. Open heart surgery. Making your heart strong again (cardiac rehabilitation) through exercise, education, and counseling. Follow these instructions at home: Medicines Take over-the-counter and prescription medicines only as told by your doctor. Do not take these medicines unless your doctor says it is okay: NSAIDs, such as ibuprofen, naproxen, or celecoxib. Any vitamins or supplements. Hormone replacement therapy that has estrogen with or without progestin. If you are taking blood thinners: Talk with your doctor before taking any medicines that have aspirin or NSAIDs, such as ibuprofen. Take medicines exactly as told. Take them at the same time each day. Avoid doing things that could hurt or bruise you. Take action to prevent falls. Wear an alert bracelet or carry a card that shows you are taking blood thinners. Lifestyle  Do not smoke or use any products that contain nicotine or tobacco. If you need help quitting, ask your doctor. Avoid secondhand smoke. Exercise regularly. Ask your doctor about a cardiac rehab program. Eat heart-healthy foods. Your doctor will tell you what foods to eat. Stay at a healthy weight. Learn ways to lower your stress level. Do not use illegal drugs. Alcohol use Do not drink alcohol if: Your doctor tells you not to drink. You are pregnant, may be pregnant, or are planning to become pregnant. If you drink alcohol: Limit how much you have to: 0-1 drink a day for women. 0-2 drinks a day for men. Know how much alcohol is in your drink. In the [place] one drink equals one 12 oz bottle of beer (355 mL), one 5 oz glass of wine (148 mL), or one 1 oz glass of hard liquor (44 mL). General instructions Work with  your doctor to treat other problems you may have, such as diabetes or high blood pressure. Get screened for depression. Get treatment if needed. Keep your vaccines up to date. Get the flu shot (influenza vaccine) every year. Keep all follow-up visits. Contact a doctor if: You feel very sad. You have trouble doing your daily activities. You get light-headed or dizzy. Get help right away if: You have sudden, unexplained discomfort in your chest, arms, back, neck, jaw, or upper body. You have shortness of breath. You have sudden sweating or clammy skin. You feel like you may vomit or you vomit. You feel tired or weak. You feel your heart beating fast. You feel your heart skipping beats. You have blood pressure that is higher than 180/120. These symptoms may be an emergency. Get help right away. Call your local emergency services (911 in the [place] Do not wait to see if the symptoms will go away. Do not drive yourself to the hospital. Summary A heart attack occurs when blood and oxygen supply to the heart is cut off. Do not take NSAIDs unless your doctor says it is okay.  Do not smoke. Avoid secondhand smoke. Exercise regularly. Ask your doctor about a cardiac rehab program. This information is not intended to replace advice given to you by your health care provider. Make sure you discuss any questions you have with your health care provider. Document Revised: 12/31/2020 Document Reviewed: 12/31/2020 Elsevier Patient Education  2023 [employer].    ,

## 2021-12-30 NOTE — Assessment & Plan Note (Signed)
Presentation is most consistent cholelithiasis, GERD.  Differential also include gastroparesis.  Pending labs, CT abdomen pelvis particular with a history of diverticulitis, renal stone though I think this diagnosis would be less likely.  Advised her to start Protonix 20 mg every morning.  She will let me know how she is doing.

## 2021-12-30 NOTE — Assessment & Plan Note (Signed)
Chest pain has improved with increase of Zoloft to 100 mg.  Chest pain is nonexertional.  EKG shows normal sinus rhythm without acute changes from prior EKG May 03, 2016.  Patient does however have a strong family history of heart disease.  Urgent referral placed to cardiology for further evaluation and consideration of CT calcium score.  Advised patient chest pain were to recur she would need to go immediately to emergency room for further evaluation serial EKG monitoring and cardiac enzyme monitoring.  Patient and husband verbalized understanding

## 2021-12-31 ENCOUNTER — Other Ambulatory Visit: Payer: Self-pay

## 2021-12-31 ENCOUNTER — Other Ambulatory Visit: Payer: Self-pay | Admitting: Family

## 2021-12-31 ENCOUNTER — Encounter: Payer: Self-pay | Admitting: Family

## 2021-12-31 DIAGNOSIS — I10 Essential (primary) hypertension: Secondary | ICD-10-CM

## 2021-12-31 DIAGNOSIS — K760 Fatty (change of) liver, not elsewhere classified: Secondary | ICD-10-CM | POA: Insufficient documentation

## 2021-12-31 DIAGNOSIS — N281 Cyst of kidney, acquired: Secondary | ICD-10-CM

## 2021-12-31 DIAGNOSIS — R1013 Epigastric pain: Secondary | ICD-10-CM

## 2021-12-31 DIAGNOSIS — N83209 Unspecified ovarian cyst, unspecified side: Secondary | ICD-10-CM

## 2021-12-31 LAB — CA 125: CA 125: 15 U/mL (ref <35)

## 2021-12-31 MED ORDER — PANTOPRAZOLE SODIUM 20 MG PO TBEC: 20.0000 mg | DELAYED_RELEASE_TABLET | Freq: Every day | ORAL | 2 refills | Status: DC

## 2022-01-03 LAB — H. PYLORI BREATH TEST: H. pylori Breath Test: NOT DETECTED

## 2022-01-03 NOTE — Telephone Encounter (Signed)
Call pt Please sch appt in couple  of weeks to discuss all findings of ct a/p from 12/30/21

## 2022-01-04 ENCOUNTER — Encounter: Payer: Self-pay | Admitting: Family

## 2022-01-04 NOTE — Telephone Encounter (Signed)
Spoke to patient and scheduled appointment for 7/723   to discuss CT findings.

## 2022-01-05 ENCOUNTER — Ambulatory Visit
Admission: RE | Admit: 2022-01-05 | Discharge: 2022-01-05 | Disposition: A | Discharge status: Home / Self Care | Payer: BLUE CROSS/BLUE SHIELD | Source: Ambulatory Visit | Attending: Family | Admitting: Family

## 2022-01-05 ENCOUNTER — Ambulatory Visit: Payer: Self-pay | Admitting: Family

## 2022-01-05 ENCOUNTER — Other Ambulatory Visit: Payer: Self-pay | Admitting: Family

## 2022-01-05 DIAGNOSIS — N281 Cyst of kidney, acquired: Secondary | ICD-10-CM | POA: Diagnosis not present

## 2022-01-05 DIAGNOSIS — N83209 Unspecified ovarian cyst, unspecified side: Secondary | ICD-10-CM

## 2022-01-05 DIAGNOSIS — Z90710 Acquired absence of both cervix and uterus: Secondary | ICD-10-CM | POA: Diagnosis not present

## 2022-01-05 DIAGNOSIS — N200 Calculus of kidney: Secondary | ICD-10-CM | POA: Diagnosis not present

## 2022-01-05 DIAGNOSIS — N8302 Follicular cyst of left ovary: Secondary | ICD-10-CM | POA: Diagnosis not present

## 2022-01-10 ENCOUNTER — Encounter: Payer: Self-pay | Admitting: Family

## 2022-01-12 ENCOUNTER — Other Ambulatory Visit: Payer: Self-pay

## 2022-01-12 ENCOUNTER — Other Ambulatory Visit: Payer: Self-pay | Admitting: Family

## 2022-01-12 ENCOUNTER — Encounter: Payer: Self-pay | Admitting: Family

## 2022-01-12 DIAGNOSIS — R079 Chest pain, unspecified: Secondary | ICD-10-CM

## 2022-01-12 DIAGNOSIS — F418 Other specified anxiety disorders: Secondary | ICD-10-CM

## 2022-01-12 MED ORDER — MONTELUKAST SODIUM 10 MG PO TABS: 10.0000 mg | ORAL_TABLET | Freq: Every day | ORAL | 0 refills | Status: DC

## 2022-01-12 MED ORDER — SERTRALINE HCL 100 MG PO TABS: 100.0000 mg | ORAL_TABLET | Freq: Every day | ORAL | 1 refills | Status: DC

## 2022-01-13 NOTE — Telephone Encounter (Signed)
Annette Gonzales from [facility] called requesting clarification for a medication-sertraline  [phone]

## 2022-01-18 ENCOUNTER — Other Ambulatory Visit: Payer: Self-pay | Admitting: Family

## 2022-01-18 ENCOUNTER — Telehealth: Payer: Self-pay | Admitting: Family

## 2022-01-18 DIAGNOSIS — I10 Essential (primary) hypertension: Secondary | ICD-10-CM

## 2022-01-18 DIAGNOSIS — R079 Chest pain, unspecified: Secondary | ICD-10-CM

## 2022-01-19 ENCOUNTER — Encounter: Payer: Self-pay | Admitting: Family

## 2022-01-19 ENCOUNTER — Other Ambulatory Visit: Payer: Self-pay

## 2022-01-19 ENCOUNTER — Other Ambulatory Visit: Payer: Self-pay | Admitting: Family

## 2022-01-19 DIAGNOSIS — R079 Chest pain, unspecified: Secondary | ICD-10-CM

## 2022-01-19 MED ORDER — TRAZODONE HCL 50 MG PO TABS: 50.0000 mg | ORAL_TABLET | Freq: Every day | ORAL | 3 refills | Status: DC

## 2022-01-24 ENCOUNTER — Other Ambulatory Visit: Payer: Self-pay

## 2022-01-24 MED ORDER — TRAZODONE HCL 50 MG PO TABS: 50.0000 mg | ORAL_TABLET | Freq: Every day | ORAL | 3 refills | Status: DC

## 2022-01-27 ENCOUNTER — Other Ambulatory Visit: Payer: BLUE CROSS/BLUE SHIELD

## 2022-01-28 ENCOUNTER — Ambulatory Visit: Payer: BLUE CROSS/BLUE SHIELD | Admitting: Family

## 2022-02-03 ENCOUNTER — Other Ambulatory Visit: Payer: BLUE CROSS/BLUE SHIELD

## 2022-02-08 ENCOUNTER — Telehealth: Payer: Self-pay | Admitting: Physician Assistant

## 2022-02-08 DIAGNOSIS — L255 Unspecified contact dermatitis due to plants, except food: Secondary | ICD-10-CM

## 2022-02-08 MED ORDER — TRIAMCINOLONE ACETONIDE 0.1 % EX CREA: 1.0000 | TOPICAL_CREAM | Freq: Two times a day (BID) | CUTANEOUS | 0 refills | Status: DC

## 2022-02-08 NOTE — Progress Notes (Signed)
I have spent 5 minutes in review of e-visit questionnaire, review and updating patient chart, medical decision making and response to patient.   Masie Bermingham Cody Natalyn Szymanowski, PA-C    

## 2022-02-08 NOTE — Progress Notes (Signed)
E-Visit for [employer]  We are sorry that you are not feeing well.  Here is how we plan to help!  Based on what you have shared with me it looks like you have had an allergic reaction to the oily resin from a group of plants.  This resin is very sticky, so it easily attaches to your skin, clothing, tools equipment, and pet's fur.    This blistering rash is often called poison ivy rash although it can come from contact with the leaves, stems and roots of poison ivy, poison oak and poison sumac.  The oily resin contains urushiol (u-ROO-she-ol) that produces a skin rash on exposed skin.  The severity of the rash depends on the amount of urushiol that gets on your skin.  A section of skin with more urushiol on it may develop a rash sooner.  The rash usually develops 12-48 hours after exposure and can last two to three weeks.  Your skin must come in direct contact with the plant's oil to be affected.  Blister fluid doesn't spread the rash.  However, if you come into contact with a piece of clothing or pet fur that has urushiol on it, the rash may spread out.  You can also transfer the oil to other parts of your body with your fingers.  Often the rash looks like a straight line because of the way the plant brushes against your skin.  Most poison ivy treatments are usually limited to self-care methods. Small areas of the rash will typically go away on its own in two to three weeks.  Since your rash is limited, I am recommending that you follow these recommendations:  Make sure that the clothes you were wearing and any towels or sheets that may have come in contact with the oil (urushiol) are washed in detergent and hot water.  Apply Benadryl or Caladryl lotion to the rash.  You may apply these as often as needed to control the itching.  Cool baths also often help with itching.  You may also apply an over-the-counter corticosteroid cream for the first few days.  Take oral antihistamines, such as diphenhydramine  (Benadryl, others), which may also help you sleep better.  Soak in a cool-water bath containing an oatmeal-based bath product (Aveeno).  Place Cool, wet compresses on the affected area for 15 to 30 minutes several times a day.  Avoid a hot shower or bath as this may increase your itching.     I am prescribing a topical, potent steroid cream to apply to the areas as directed to speed up resolution of this rash, especially since you cannot take prednisone (oral steroid)  What can you do to prevent this rash?  Avoid the plants.  Learn how to identify poison ivy, poison oak and poison sumac in all seasons.  When hiking or engaging in other activities that might expose you to these plants, try to stay on cleared pathways.  If camping, make sure you pitch your tent in an area free of these plants.  Keep pets from running through wooded areas so that urushiol doesn't accidentally stick to their fur, which you may touch.  Remove or kill the plants.  In your yard, you can get rid of poison ivy by applying an herbicide or pulling it out of the ground, including the roots, while wearing heavy gloves.  Afterward remove the gloves and thoroughly wash them and your hands.  Don't burn poison ivy or related plants because the urushiol  can be carried by smoke.  Wear protective clothing.  If needed, protect your skin by wearing socks, boots, pants, long sleeves and vinyl gloves.  Wash your skin right away.  Washing off the oil with soap and water within 30 minutes of exposure may reduce your chances of getting a poison ivy rash.  Even washing after an hour or so can help reduce the severity of the rash.  If you walk through some poison ivy and then later touch your shoes, you may get some urushiol on your hands, which may then transfer to your face or body by touching or rubbing.  If the contaminated object isn't cleaned, the urushiol on it can still cause a skin reaction years later.    Be careful not to reuse  towels after you have washed your skin.  Also carefully wash clothing in detergent and hot water to remove all traces of the oil.  Handle contaminated clothing carefully so you don't transfer the urushiol to yourself, furniture, rugs or appliances.  Remember that pets can carry the oil on their fur and paws.  If you think your pet may be contaminated with urushiol, put on some long rubber gloves and give your pet a bath.  Finally, be careful not to burn these plants as the smoke can contain traces of the oil.  Inhaling the smoke may result in difficulty breathing. If that occurred you should see a physician as soon as possible.  See your doctor right away if:  The reaction is severe or widespread You inhaled the smoke from burning poison ivy and are having difficulty breathing Your skin continues to swell The rash affects your eyes, mouth or genitals Blisters are oozing pus You develop a fever greater than 100 F (37.8 C) The rash doesn't get better within a few weeks.  If you scratch the poison ivy rash, bacteria under your fingernails may cause the skin to become infected.  See your doctor if pus starts oozing from the blisters.  Treatment generally includes antibiotics.  Poison ivy treatments are usually limited to self-care methods.  And the rash typically goes away on its own in two to three weeks.     If the rash is widespread or results in a large number of blisters, your doctor may prescribe an oral corticosteroid, such as prednisone.  If a bacterial infection has developed at the rash site, your doctor may give you a prescription for an oral antibiotic.  MAKE SURE YOU  Understand these instructions. Will watch your condition. Will get help right away if you are not doing well or get worse.   Thank you for choosing an e-visit.  Your e-visit answers were reviewed by a board certified advanced clinical practitioner to complete your personal care plan. Depending upon the condition,  your plan could have included both over the counter or prescription medications.  Please review your pharmacy choice. Make sure the pharmacy is open so you can pick up prescription now. If there is a problem, you may contact your provider through [employer] and have the prescription routed to another pharmacy.  Your safety is important to [place]. If you have drug allergies check your prescription carefully.   For the next 24 hours you can use MyChart to ask questions about today's visit, request a non-urgent call back, or ask for a work or school excuse. You will get an email in the next two days asking about your experience. I hope that your e-visit has been valuable  and will speed your recovery.

## 2022-02-12 ENCOUNTER — Other Ambulatory Visit: Payer: Self-pay | Admitting: Family

## 2022-02-15 ENCOUNTER — Other Ambulatory Visit: Payer: Self-pay

## 2022-02-15 MED ORDER — LISINOPRIL 10 MG PO TABS: 10.0000 mg | ORAL_TABLET | Freq: Every day | ORAL | 0 refills | Status: DC

## 2022-02-17 ENCOUNTER — Telehealth: Payer: Self-pay | Admitting: Emergency Medicine

## 2022-02-17 ENCOUNTER — Ambulatory Visit: Payer: BLUE CROSS/BLUE SHIELD | Admitting: Cardiology

## 2022-02-17 DIAGNOSIS — N898 Other specified noninflammatory disorders of vagina: Secondary | ICD-10-CM

## 2022-02-17 MED ORDER — FLUCONAZOLE 150 MG PO TABS
150.0000 mg | ORAL_TABLET | Freq: Once | ORAL | 0 refills | Status: AC
Start: 2022-02-17 — End: 2022-02-17

## 2022-02-17 NOTE — Progress Notes (Signed)

## 2022-03-16 ENCOUNTER — Ambulatory Visit: Payer: BLUE CROSS/BLUE SHIELD | Admitting: Family

## 2022-03-30 ENCOUNTER — Other Ambulatory Visit: Payer: Self-pay | Admitting: Family

## 2022-03-30 DIAGNOSIS — R1013 Epigastric pain: Secondary | ICD-10-CM

## 2022-04-05 ENCOUNTER — Telehealth: Payer: BLUE CROSS/BLUE SHIELD | Admitting: Physician Assistant

## 2022-04-05 DIAGNOSIS — H9201 Otalgia, right ear: Secondary | ICD-10-CM | POA: Diagnosis not present

## 2022-04-05 MED ORDER — AMOXICILLIN 500 MG PO TABS: 500.0000 mg | ORAL_TABLET | Freq: Two times a day (BID) | ORAL | 0 refills | Status: AC

## 2022-04-05 NOTE — Progress Notes (Signed)
I have spent 5 minutes in review of e-visit questionnaire, review and updating patient chart, medical decision making and response to patient.   Kimesha Claxton Cody Jayen Bromwell, PA-C    

## 2022-04-05 NOTE — Progress Notes (Signed)
E Visit for [facility]  We are sorry that you are not feeling well. Here is how we plan to help!  Based on what you have shared with me I am concerned for a developing middle ear infection. I am starting you on Amoxicillin to take twice daily for 10 days. Tylenol or Motrin if needed for pain.   Your symptoms should improve over the next 2-3 days and should fully resolve in about 7 days.  HOME CARE:  Wash your hands frequently. Do not place the tip of the bottle on your ear or touch it with your fingers. You can take Acetominophen 650 mg every 4-6 hours as needed for pain.  If pain is severe or moderate, you can apply a heating pad (set on low) or hot water bottle (wrapped in a towel) to outer ear for 20 minutes.  This will also increase drainage. Avoid ear plugs Do not use Q-tips After showers, help the water run out by tilting your head to one side.  GET HELP RIGHT AWAY IF:  Fever is over 102.2 degrees. You develop progressive ear pain or hearing loss. Ear symptoms persist longer than 3 days after treatment.  MAKE SURE YOU:  Understand these instructions. Will watch your condition. Will get help right away if you are not doing well or get worse.     Thank you for choosing an e-visit.  Your e-visit answers were reviewed by a board certified advanced clinical practitioner to complete your personal care plan. Depending upon the condition, your plan could have included both over the counter or prescription medications.  Please review your pharmacy choice. Make sure the pharmacy is open so you can pick up prescription now. If there is a problem, you may contact your provider through [employer] and have the prescription routed to another pharmacy.  Your safety is important to [place]. If you have drug allergies check your prescription carefully.   For the next 24 hours you can use MyChart to ask questions about today's visit, request a non-urgent call back, or ask for a work or  school excuse. You will get an email in the next two days asking about your experience. I hope that your e-visit has been valuable and will speed your recovery.

## 2022-04-13 ENCOUNTER — Other Ambulatory Visit: Payer: Self-pay | Admitting: Family

## 2022-04-19 ENCOUNTER — Telehealth: Payer: BLUE CROSS/BLUE SHIELD | Admitting: Physician Assistant

## 2022-04-19 DIAGNOSIS — U071 COVID-19: Secondary | ICD-10-CM | POA: Diagnosis not present

## 2022-04-19 MED ORDER — ALBUTEROL SULFATE HFA 108 (90 BASE) MCG/ACT IN AERS: 2.0000 | INHALATION_SPRAY | Freq: Four times a day (QID) | RESPIRATORY_TRACT | 0 refills | Status: DC | PRN

## 2022-04-19 MED ORDER — PROMETHAZINE-DM 6.25-15 MG/5ML PO SYRP: 5.0000 mL | ORAL_SOLUTION | Freq: Four times a day (QID) | ORAL | 0 refills | Status: DC | PRN

## 2022-04-19 MED ORDER — MOLNUPIRAVIR EUA 200MG CAPSULE: 4.0000 | ORAL_CAPSULE | Freq: Two times a day (BID) | ORAL | 0 refills | Status: AC

## 2022-04-19 NOTE — Progress Notes (Signed)
Virtual Visit Consent   Annette Gonzales, you are scheduled for a virtual visit with a [place] provider today. Just as with appointments in the office, your consent must be obtained to participate. Your consent will be active for this visit and any virtual visit you may have with one of our providers in the next 365 days. If you have a MyChart account, a copy of this consent can be sent to you electronically.  As this is a virtual visit, video technology does not allow for your provider to perform a traditional examination. This may limit your provider's ability to fully assess your condition. If your provider identifies any concerns that need to be evaluated in person or the need to arrange testing (such as labs, EKG, etc.), we will make arrangements to do so. Although advances in technology are sophisticated, we cannot ensure that it will always work on either your end or our end. If the connection with a video visit is poor, the visit may have to be switched to a telephone visit. With either a video or telephone visit, we are not always able to ensure that we have a secure connection.  By engaging in this virtual visit, you consent to the provision of healthcare and authorize for your insurance to be billed (if applicable) for the services provided during this visit. Depending on your insurance coverage, you may receive a charge related to this service.  I need to obtain your verbal consent now. Are you willing to proceed with your visit today? Annette Gonzales has provided verbal consent on 04/19/2022 for a virtual visit (video or telephone). Annette Gonzales, [place]  Date: 04/19/2022 9:22 AM  Virtual Visit via Video Note   I, Annette Gonzales, connected with  MARITSA HUNSUCKER  ([id number], 20-Sep-1970) on 04/19/22 at  9:15 AM EDT by a video-enabled telemedicine application and verified that I am speaking with the correct person using two identifiers.  Location: Patient: Virtual Visit  Location Patient: Home Provider: Virtual Visit Location Provider: Home Office   I discussed the limitations of evaluation and management by telemedicine and the availability of in person appointments. The patient expressed understanding and agreed to proceed.    History of Present Illness: Annette Gonzales is a 51 y.o. who identifies as a female who was assigned female at birth, and is being seen today for COVID-19. Noted symptoms starting Sunday night into Monday with fatigue and ST. No with aches, chills, fever, cough and congestion. Notes some chest tightness without chest pain or SOB. Denies GI symptoms. Home COVID test is positive this morning. Had COVID in December which was quite severe. Was on antivirals with could tolerability and progress.   HPI: HPI  Problems:  Patient Active Problem List   Diagnosis Date Noted   Hepatic steatosis 12/31/2021   Chest pain 12/30/2021   Epigastric pain 12/30/2021   COVID-19 07/23/2021   Dizziness 10/30/2020   Herpes zoster without complication 04/27/2020   Flank pain 05/09/2019   Allergic rhinitis 09/17/2018   Screen for colon cancer 04/03/2017   Stress incontinence of urine 07/07/2016   Shoulder pain 06/20/2016   History of asthma 04/24/2015   Depression with anxiety 04/24/2015   Arthralgia of both knees 04/24/2015   Diverticulosis of colon without hemorrhage 04/24/2015   Headache 04/24/2015   GERD (gastroesophageal reflux disease) 04/24/2015   History of gastric ulcer 04/24/2015   Benign essential HTN 04/24/2015   HLD (hyperlipidemia) 04/24/2015   History of nephrolithiasis 04/24/2015  Overweight 04/24/2015   Encounter to establish care 04/24/2015    Allergies:  Allergies  Allergen Reactions   Sulfa Antibiotics Other (See Comments)    Nausea and hot flashes   Medications:  Current Outpatient Medications:    molnupiravir EUA (LAGEVRIO) 200 mg CAPS capsule, Take 4 capsules (800 mg total) by mouth 2 (two) times daily for 5 days.,  Disp: 40 capsule, Rfl: 0   promethazine-dextromethorphan (PROMETHAZINE-DM) 6.25-15 MG/5ML syrup, Take 5 mLs by mouth 4 (four) times daily as needed for cough., Disp: 118 mL, Rfl: 0   albuterol (VENTOLIN HFA) 108 (90 Base) MCG/ACT inhaler, Inhale 2 puffs into the lungs every 6 (six) hours as needed for wheezing or shortness of breath., Disp: 8 g, Rfl: 0   fluticasone (FLONASE) 50 MCG/ACT nasal spray, Place 2 sprays into both nostrils daily., Disp: 16 g, Rfl: 2   guaiFENesin (MUCINEX) 600 MG 12 hr tablet, Take 2 tablets (1,200 mg total) by mouth 2 (two) times daily., Disp: 28 tablet, Rfl: 3   lidocaine (XYLOCAINE) 2 % solution, Use as directed 15 mLs in the mouth or throat as needed for mouth pain., Disp: 200 mL, Rfl: 0   lisinopril (ZESTRIL) 10 MG tablet, Take 1 tablet (10 mg total) by mouth daily., Disp: 90 tablet, Rfl: 0   montelukast (SINGULAIR) 10 MG tablet, Take 1 tablet by mouth at bedtime., Disp: 90 tablet, Rfl: 0   sertraline (ZOLOFT) 100 MG tablet, Take 1 tablet (100 mg total) by mouth at bedtime., Disp: 90 tablet, Rfl: 1   traZODone (DESYREL) 50 MG tablet, Take 1 tablet (50 mg total) by mouth at bedtime., Disp: 30 tablet, Rfl: 3  Observations/Objective: Patient is well-developed, well-nourished in no acute distress.  Resting comfortably at home.  Head is normocephalic, atraumatic.  No labored breathing. Speech is clear and coherent with logical content.  Patient is alert and oriented at baseline.   Assessment and Plan: 1. COVID-19 - promethazine-dextromethorphan (PROMETHAZINE-DM) 6.25-15 MG/5ML syrup; Take 5 mLs by mouth 4 (four) times daily as needed for cough.  Dispense: 118 mL; Refill: 0 - MyChart COVID-19 home monitoring program; Future - molnupiravir EUA (LAGEVRIO) 200 mg CAPS capsule; Take 4 capsules (800 mg total) by mouth 2 (two) times daily for 5 days.  Dispense: 40 capsule; Refill: 0 - albuterol (VENTOLIN HFA) 108 (90 Base) MCG/ACT inhaler; Inhale 2 puffs into the lungs  every 6 (six) hours as needed for wheezing or shortness of breath.  Dispense: 8 g; Refill: 0  Patient with multiple risk factors for complicated course of illness. Discussed risks/benefits of antiviral medications including most common potential ADRs. Patient voiced understanding and would like to proceed with antiviral medication. They are candidate for molnupiravir. Rx sent to pharmacy. Supportive measures, OTC medications and vitamin regimen reviewed. Promethazine-dm per orders. . Patient has been enrolled in a MyChart COVID symptom monitoring program. Samule Dry reviewed in detail. Strict ER precautions discussed with patient.    Follow Up Instructions: I discussed the assessment and treatment plan with the patient. The patient was provided an opportunity to ask questions and all were answered. The patient agreed with the plan and demonstrated an understanding of the instructions.  A copy of instructions were sent to the patient via MyChart unless otherwise noted below.    The patient was advised to call back or seek an in-person evaluation if the symptoms worsen or if the condition fails to improve as anticipated.  Time:  I spent 10 minutes with the patient via telehealth technology  discussing the above problems/concerns.    Annette Climes, PA-C

## 2022-04-19 NOTE — Patient Instructions (Signed)
Annette Gonzales, thank you for joining [name], PA-C for today's virtual visit.  While this provider is not your primary care provider (PCP), if your PCP is located in our provider database this encounter information will be shared with them immediately following your visit.  Consent: (Patient) Annette Gonzales provided verbal consent for this virtual visit at the beginning of the encounter.  Current Medications:  Current Outpatient Medications:    albuterol (VENTOLIN HFA) 108 (90 Base) MCG/ACT inhaler, Inhale 2 puffs into the lungs every 6 (six) hours as needed for wheezing or shortness of breath., Disp: 8 g, Rfl: 0   benzonatate (TESSALON) 100 MG capsule, Take 1 capsule (100 mg total) by mouth 3 (three) times daily as needed for cough., Disp: 30 capsule, Rfl: 0   chlorpheniramine-HYDROcodone (TUSSIONEX PENNKINETIC ER) 10-8 MG/5ML SUER, Take 5 mLs by mouth at bedtime as needed for cough., Disp: 70 mL, Rfl: 0   fluticasone (FLONASE) 50 MCG/ACT nasal spray, Place 2 sprays into both nostrils daily., Disp: 16 g, Rfl: 2   guaiFENesin (MUCINEX) 600 MG 12 hr tablet, Take 2 tablets (1,200 mg total) by mouth 2 (two) times daily., Disp: 28 tablet, Rfl: 3   lidocaine (XYLOCAINE) 2 % solution, Use as directed 15 mLs in the mouth or throat as needed for mouth pain., Disp: 200 mL, Rfl: 0   lisinopril (ZESTRIL) 10 MG tablet, Take 1 tablet (10 mg total) by mouth daily., Disp: 90 tablet, Rfl: 0   montelukast (SINGULAIR) 10 MG tablet, Take 1 tablet by mouth at bedtime., Disp: 90 tablet, Rfl: 0   pantoprazole (PROTONIX) 20 MG tablet, TAKE 1 TABLET (20 MG TOTAL) BY MOUTH DAILY. 1 HR PRIOR TO BREAKFAST, Disp: 90 tablet, Rfl: 0   sertraline (ZOLOFT) 100 MG tablet, Take 1 tablet (100 mg total) by mouth at bedtime., Disp: 90 tablet, Rfl: 1   traZODone (DESYREL) 50 MG tablet, Take 1 tablet (50 mg total) by mouth at bedtime., Disp: 30 tablet, Rfl: 3   triamcinolone cream (KENALOG) 0.1 %, Apply 1 Application  topically 2 (two) times daily., Disp: 30 g, Rfl: 0   Medications ordered in this encounter:  No orders of the defined types were placed in this encounter.    *If you need refills on other medications prior to your next appointment, please contact your pharmacy*  Follow-Up: Call back or seek an in-person evaluation if the symptoms worsen or if the condition fails to improve as anticipated.  [facility] [phone]  Other Instructions Please keep well-hydrated and get plenty of rest. Start a saline nasal rinse to flush out your nasal passages. You can use plain Mucinex to help thin congestion. If you have a humidifier, running in the bedroom at night. I want you to start OTC vitamin D3 1000 units daily, vitamin C 1000 mg daily, and a zinc supplement. Please take prescribed medications as directed.  You have been enrolled in a MyChart symptom monitoring program. Please answer these questions daily so we can keep track of how you are doing.  You were to quarantine for 5 days from onset of your symptoms.  After day 5, if you have had no fever and you are feeling better, you can end quarantine but need to mask for an additional 5 days. After day 5 if you have a fever or are having significant symptoms, please quarantine for full 10 days.  If you note any worsening of symptoms, any significant shortness of breath or any  chest pain, please seek ER evaluation ASAP.  Please do not delay care!  COVID-19: What to Do if You Are Sick If you test positive and are an older adult or someone who is at high risk of getting very sick from COVID-19, treatment may be available. Contact a healthcare provider right away after a positive test to determine if you are eligible, even if your symptoms are mild right now. You can also visit a Test to Treat location and, if eligible, receive a prescription from a provider. Don't delay: Treatment must be started within the first few days to be  effective. If you have a fever, cough, or other symptoms, you might have COVID-19. Most people have mild illness and are able to recover at home. If you are sick: Keep track of your symptoms. If you have an emergency warning sign (including trouble breathing), call 911. Steps to help prevent the spread of COVID-19 if you are sick If you are sick with COVID-19 or think you might have COVID-19, follow the steps below to care for yourself and to help protect other people in your home and community. Stay home except to get medical care Stay home. Most people with COVID-19 have mild illness and can recover at home without medical care. Do not leave your home, except to get medical care. Do not visit public areas and do not go to places where you are unable to wear a mask. Take care of yourself. Get rest and stay hydrated. Take over-the-counter medicines, such as acetaminophen, to help you feel better. Stay in touch with your doctor. Call before you get medical care. Be sure to get care if you have trouble breathing, or have any other emergency warning signs, or if you think it is an emergency. Avoid public transportation, ride-sharing, or taxis if possible. Get tested If you have symptoms of COVID-19, get tested. While waiting for test results, stay away from others, including staying apart from those living in your household. Get tested as soon as possible after your symptoms start. Treatments may be available for people with COVID-19 who are at risk for becoming very sick. Don't delay: Treatment must be started early to be effective--some treatments must begin within 5 days of your first symptoms. Contact your healthcare provider right away if your test result is positive to determine if you are eligible. Self-tests are one of several options for testing for the virus that causes COVID-19 and may be more convenient than laboratory-based tests and point-of-care tests. Ask your healthcare provider or your  local health department if you need help interpreting your test results. You can visit your state, tribal, local, and territorial health department's website to look for the latest local information on testing sites. Separate yourself from other people As much as possible, stay in a specific room and away from other people and pets in your home. If possible, you should use a separate bathroom. If you need to be around other people or animals in or outside of the home, wear a well-fitting mask. Tell your close contacts that they may have been exposed to COVID-19. An infected person can spread COVID-19 starting 48 hours (or 2 days) before the person has any symptoms or tests positive. By letting your close contacts know they may have been exposed to COVID-19, you are helping to protect everyone. See COVID-19 and Animals if you have questions about pets. If you are diagnosed with COVID-19, someone from the health department may call you. Answer the call  to slow the spread. Monitor your symptoms Symptoms of COVID-19 include fever, cough, or other symptoms. Follow care instructions from your healthcare provider and local health department. Your local health authorities may give instructions on checking your symptoms and reporting information. When to seek emergency medical attention Look for emergency warning signs* for COVID-19. If someone is showing any of these signs, seek emergency medical care immediately: Trouble breathing Persistent pain or pressure in the chest New confusion Inability to wake or stay awake Pale, gray, or blue-colored skin, lips, or nail beds, depending on skin tone *This list is not all possible symptoms. Please call your medical provider for any other symptoms that are severe or concerning to you. Call 911 or call ahead to your local emergency facility: Notify the operator that you are seeking care for someone who has or may have COVID-19. Call ahead before visiting your  doctor Call ahead. Many medical visits for routine care are being postponed or done by phone or telemedicine. If you have a medical appointment that cannot be postponed, call your doctor's office, and tell them you have or may have COVID-19. This will help the office protect themselves and other patients. If you are sick, wear a well-fitting mask You should wear a mask if you must be around other people or animals, including pets (even at home). Wear a mask with the best fit, protection, and comfort for you. You don't need to wear the mask if you are alone. If you can't put on a mask (because of trouble breathing, for example), cover your coughs and sneezes in some other way. Try to stay at least 6 feet away from other people. This will help protect the people around you. Masks should not be placed on young children under age 74 years, anyone who has trouble breathing, or anyone who is not able to remove the mask without help. Cover your coughs and sneezes Cover your mouth and nose with a tissue when you cough or sneeze. Throw away used tissues in a lined trash can. Immediately wash your hands with soap and water for at least 20 seconds. If soap and water are not available, clean your hands with an alcohol-based hand sanitizer that contains at least 60% alcohol. Clean your hands often Wash your hands often with soap and water for at least 20 seconds. This is especially important after blowing your nose, coughing, or sneezing; going to the bathroom; and before eating or preparing food. Use hand sanitizer if soap and water are not available. Use an alcohol-based hand sanitizer with at least 60% alcohol, covering all surfaces of your hands and rubbing them together until they feel dry. Soap and water are the best option, especially if hands are visibly dirty. Avoid touching your eyes, nose, and mouth with unwashed hands. Handwashing Tips Avoid sharing personal household items Do not share dishes,  drinking glasses, cups, eating utensils, towels, or bedding with other people in your home. Wash these items thoroughly after using them with soap and water or put in the dishwasher. Clean surfaces in your home regularly Clean and disinfect high-touch surfaces (for example, doorknobs, tables, handles, light switches, and countertops) in your "sick room" and bathroom. In shared spaces, you should clean and disinfect surfaces and items after each use by the person who is ill. If you are sick and cannot clean, a caregiver or other person should only clean and disinfect the area around you (such as your bedroom and bathroom) on an as needed basis. Your  caregiver/other person should wait as long as possible (at least several hours) and wear a mask before entering, cleaning, and disinfecting shared spaces that you use. Clean and disinfect areas that may have blood, stool, or body fluids on them. Use household cleaners and disinfectants. Clean visible dirty surfaces with household cleaners containing soap or detergent. Then, use a household disinfectant. Use a product from [employer] List N: Disinfectants for Coronavirus (COVID-19). Be sure to follow the instructions on the label to ensure safe and effective use of the product. Many products recommend keeping the surface wet with a disinfectant for a certain period of time (look at "contact time" on the product label). You may also need to wear personal protective equipment, such as gloves, depending on the directions on the product label. Immediately after disinfecting, wash your hands with soap and water for 20 seconds. For completed guidance on cleaning and disinfecting your home, visit Complete Disinfection Guidance. Take steps to improve ventilation at home Improve ventilation (air flow) at home to help prevent from spreading COVID-19 to other people in your household. Clear out COVID-19 virus particles in the air by opening windows, using air filters, and  turning on fans in your home. Use this interactive tool to learn how to improve air flow in your home. When you can be around others after being sick with COVID-19 Deciding when you can be around others is different for different situations. Find out when you can safely end home isolation. For any additional questions about your care, contact your healthcare provider or state or local health department. 10/13/2020 Content source: [facility] ([facility]), Division of Viral Diseases This information is not intended to replace advice given to you by your health care provider. Make sure you discuss any questions you have with your health care provider. Document Revised: 11/26/2020 Document Reviewed: 11/26/2020 Elsevier Patient Education  2022 [employer].      If you have been instructed to have an in-person evaluation today at a local Urgent Care facility, please use the link below. It will take you to a list of all of our available [facility], including address, phone number and hours of operation. Please do not delay care.  [facility]  If you or a family member do not have a primary care provider, use the link below to schedule a visit and establish care. When you choose a [facility] primary care physician or advanced practice provider, you gain a long-term partner in health. Find a Primary Care Provider  Learn more about [facility] in-office and virtual care options: [facility] Get Care Now

## 2022-04-20 ENCOUNTER — Telehealth: Payer: Self-pay

## 2022-04-20 NOTE — Telephone Encounter (Signed)
Called patient in regards to worse symptom of weakness indicated in the COVID questionnaire on my chart. No answer, left vm to call community line if she has questions.

## 2022-04-22 ENCOUNTER — Other Ambulatory Visit: Payer: Self-pay

## 2022-04-22 MED ORDER — MONTELUKAST SODIUM 10 MG PO TABS: 10.0000 mg | ORAL_TABLET | Freq: Every day | ORAL | 0 refills | Status: DC

## 2022-04-26 ENCOUNTER — Encounter (INDEPENDENT_AMBULATORY_CARE_PROVIDER_SITE_OTHER): Payer: Self-pay

## 2022-04-26 ENCOUNTER — Telehealth: Payer: Self-pay

## 2022-04-26 NOTE — Telephone Encounter (Signed)
Called patient in regards to the worse symptoms she is having with weakness and appetite that was indicated in [place] on my chart. No answer, left vm.

## 2022-05-17 ENCOUNTER — Other Ambulatory Visit: Payer: Self-pay

## 2022-05-17 ENCOUNTER — Emergency Department: Payer: BLUE CROSS/BLUE SHIELD

## 2022-05-17 ENCOUNTER — Emergency Department
Admission: EM | Admit: 2022-05-17 | Discharge: 2022-05-17 | Disposition: A | Discharge status: Home / Self Care | Payer: BLUE CROSS/BLUE SHIELD | Attending: Emergency Medicine | Admitting: Emergency Medicine

## 2022-05-17 DIAGNOSIS — S99912A Unspecified injury of left ankle, initial encounter: Secondary | ICD-10-CM | POA: Diagnosis not present

## 2022-05-17 DIAGNOSIS — S92015A Nondisplaced fracture of body of left calcaneus, initial encounter for closed fracture: Secondary | ICD-10-CM | POA: Diagnosis not present

## 2022-05-17 DIAGNOSIS — M7989 Other specified soft tissue disorders: Secondary | ICD-10-CM | POA: Diagnosis not present

## 2022-05-17 DIAGNOSIS — S92002A Unspecified fracture of left calcaneus, initial encounter for closed fracture: Secondary | ICD-10-CM | POA: Insufficient documentation

## 2022-05-17 DIAGNOSIS — W108XXA Fall (on) (from) other stairs and steps, initial encounter: Secondary | ICD-10-CM | POA: Diagnosis not present

## 2022-05-17 DIAGNOSIS — J45909 Unspecified asthma, uncomplicated: Secondary | ICD-10-CM | POA: Insufficient documentation

## 2022-05-17 MED ORDER — MELOXICAM 15 MG PO TABS: 15.0000 mg | ORAL_TABLET | Freq: Every day | ORAL | 0 refills | Status: AC

## 2022-05-17 MED ORDER — LISINOPRIL 10 MG PO TABS: 10.0000 mg | ORAL_TABLET | Freq: Every day | ORAL | 2 refills | Status: DC

## 2022-05-17 MED ORDER — CYCLOBENZAPRINE HCL 10 MG PO TABS: 10.0000 mg | ORAL_TABLET | Freq: Three times a day (TID) | ORAL | 0 refills | Status: AC | PRN

## 2022-05-17 NOTE — Discharge Instructions (Addendum)
-  Please continue to wear your cam boot while walking.  You may take it off when at rest to allow for ice and elevation.  -You may take the meloxicam as needed for pain and inflammation.  The cyclobenzaprine may help with surrounding muscle spasms, though use caution as it may make you dizzy/drowsy.  -Please call the orthopedic surgeon listed in these instructions (Dr. Posey Pronto) to schedule an appointment for follow-up.  Please let them know that you were seen here in the emergency department.  -Return to the emergency department anytime if you begin to experience any new or worsening symptoms.

## 2022-05-17 NOTE — ED Provider Notes (Signed)
[facility] Provider Note    Event Date/Time   First MD Initiated Contact with Patient 05/17/22 1222     (approximate)   History   Chief Complaint Ankle Pain   HPI Annette Gonzales is a 51 y.o. female, history of asthma, anxiety, depression, GERD, presents with ankle pain x1 day.  Patient states that she was walking down some steps when she twisted her ankle and fell.  Denies any head injury.  She states that she believes that her foot was everted instead of inverted.  Currently endorsing pain along lateral aspect of her metatarsal region as well as her ankle.  She is still able to ambulate on it, though with significant pain.  Denies leg pain, knee pain, thigh pain, hip pain, head injury, chest pain, shortness of breath, cold sensation in the affected foot, or numb/tingling in the affected foot.  History Limitations: No limitations.        Physical Exam  Triage Vital Signs: ED Triage Vitals  Enc Vitals Group     BP 05/17/22 1215 109/81     Pulse Rate 05/17/22 1215 87     Resp 05/17/22 1215 18     Temp 05/17/22 1215 98.2 F (36.8 C)     Temp Source 05/17/22 1215 Oral     SpO2 05/17/22 1215 97 %     Weight 05/17/22 1209 126 lb 15.8 oz (57.6 kg)     Height 05/17/22 1209 5\' 3"  (1.6 m)     Head Circumference --      Peak Flow --      Pain Score 05/17/22 1209 9     Pain Loc --      Pain Edu? --      Excl. in [place]? --     Most recent vital signs: Vitals:   05/17/22 1215  BP: 109/81  Pulse: 87  Resp: 18  Temp: 98.2 F (36.8 C)  SpO2: 97%    General: Awake, NAD.  Skin: Warm, dry. No rashes or lesions.  Eyes: PERRL. Conjunctivae normal.  CV: Good peripheral perfusion.  Resp: Normal effort.  Abd: Soft, non-tender. No distention.  Neuro: At baseline. No gross neurological deficits.  Musculoskeletal: Normal ROM of all extremities.  Focused Exam: Moderate soft tissue swelling along the lateral aspect of the left foot, particular along the  proximal fourth/fifth metatarsal region.  No significant osseous tenderness along the medial or lateral malleoli.  PMS intact distally.  No joint laxity.  Physical Exam    ED Results / Procedures / Treatments  Labs (all labs ordered are listed, but only abnormal results are displayed) Labs Reviewed - No data to display   EKG N/A.    RADIOLOGY  ED Provider Interpretation: I personally reviewed and interpreted this x-ray, fracture appreciated along the anterior calcaneus.  DG Foot Complete Left  Result Date: 05/17/2022 CLINICAL DATA:  Patient believes she had a twisting injury to left ankle/foot stepping off a porch this morning. Unable to bear weight. EXAM: LEFT FOOT - COMPLETE 3+ VIEW COMPARISON:  None Available. FINDINGS: Fracture from the anterior aspect of calcaneus is only subtly suggested on the current lateral view, better seen on the ankle radiographs. No other evidence of a fracture. Joints are normally spaced and aligned. Mild soft tissue swelling lateral to hindfoot. IMPRESSION: 1. Subtle fracture of the anterior calcaneus, better appreciated as an avulsion fracture on the ankle radiographs. Electronically Signed   By: Lajean Manes M.D.   On: 05/17/2022  13:04   DG Ankle Complete Left  Result Date: 05/17/2022 CLINICAL DATA:  Patient believes she had a twisting injury to left ankle stepping off a porch this morning. Unable to bear weight. EXAM: LEFT ANKLE COMPLETE - 3+ VIEW COMPARISON:  None Available. FINDINGS: Small avulsion fracture noted along the lateral aspect the hindfoot, 2.5 cm below the tip of the lateral malleolus, suspected to be extensor digitorum brevis avulsion fracture. No other evidence of a fracture. Ankle joint normally spaced and aligned. There is soft tissue swelling lateral to above described avulsion fracture. IMPRESSION: 1. Small avulsion fracture at appears to be from the anterior calcaneus at the origin of the extensor digitorum brevis tendon. 2. No  other evidence of a fracture.  Ankle joint normally aligned. Electronically Signed   By: Amie Portland M.D.   On: 05/17/2022 13:03    PROCEDURES:  Critical Care performed: N/A.  Procedures    MEDICATIONS ORDERED IN ED: Medications - No data to display   IMPRESSION / MDM / ASSESSMENT AND PLAN / ED COURSE  I reviewed the triage vital signs and the nursing notes.                              Differential diagnosis includes, but is not limited to, tarsal fracture, metatarsal fracture, malleolus fracture, ankle sprain.  Assessment/Plan Patient presents with left foot pain x1 day following eversion of the left foot following mechanical fall.  Physical exam shows moderate soft tissue swelling along the calcaneal region.  X-ray does show small avulsion fracture along the anterior calcaneus at the origin of the extensor digitorum brevis tendon.  Foot otherwise appears stable.  We will provide her with a cam boot and have her follow-up with orthopedics for reevaluation.  Provided her with a prescription for meloxicam and cyclobenzaprine to help manage her symptoms.  With discharge.  Provided the patient with anticipatory guidance, return precautions, and educational material. Encouraged the patient to return to the emergency department at any time if they begin to experience any new or worsening symptoms. Patient expressed understanding and agreed with the plan.   Patient's presentation is most consistent with acute complicated illness / injury requiring diagnostic workup.       FINAL CLINICAL IMPRESSION(S) / ED DIAGNOSES   Final diagnoses:  Closed nondisplaced fracture of left calcaneus, unspecified portion of calcaneus, initial encounter     Rx / DC Orders   ED Discharge Orders          Ordered    meloxicam (MOBIC) 15 MG tablet  Daily        05/17/22 1324    cyclobenzaprine (FLEXERIL) 10 MG tablet  3 times daily PRN        05/17/22 1324             Note:  This document  was prepared using Dragon voice recognition software and may include unintentional dictation errors.   Varney Daily, [place] 05/17/22 1341    Jene Every, MD 05/17/22 [phone]

## 2022-05-17 NOTE — ED Triage Notes (Signed)
Pt here stating she believes that she twisted her left ankle. Pt not able to bear weight on it.

## 2022-05-18 ENCOUNTER — Ambulatory Visit (INDEPENDENT_AMBULATORY_CARE_PROVIDER_SITE_OTHER): Payer: BLUE CROSS/BLUE SHIELD | Admitting: Orthopaedic Surgery

## 2022-05-18 ENCOUNTER — Encounter: Payer: Self-pay | Admitting: Orthopaedic Surgery

## 2022-05-18 DIAGNOSIS — M25572 Pain in left ankle and joints of left foot: Secondary | ICD-10-CM | POA: Insufficient documentation

## 2022-05-18 NOTE — Progress Notes (Signed)
Office Visit Note   Patient: Annette Gonzales           Date of Birth: 12/06/1970           MRN: [id number] Visit Date: 05/18/2022              Requested by: Burnard Hawthorne, FNP [address] [place],  [place] [address] PCP: Burnard Hawthorne, FNP   Assessment & Plan: Visit Diagnoses:  1. Pain in left ankle and joints of left foot     Plan: Impression is left ankle sprain and avulsion fracture to the calcaneus.  X-rays and condition discussed in detail and questions were answered.  We will keep her in a cam boot for couple weeks and she can wean into an ASO as tolerated.  She may use an ASO to sleep at night.  If symptoms persist are not feeling any improvement in about 4 to 6 weeks I can send in a referral for PT.  Otherwise follow-up as needed.  Follow-Up Instructions: No follow-ups on file.   Orders:  No orders of the defined types were placed in this encounter.  No orders of the defined types were placed in this encounter.     Procedures: No procedures performed   Clinical Data: No additional findings.   Subjective: Chief Complaint  Patient presents with   Left Ankle - Injury    DOI 05/17/2022    HPI Annette Gonzales is a very pleasant 51 year old female who comes in with acute left ankle injury.  She rolled her ankle yesterday and was seen at the [facility].  She is currently in a cam boot.  Pain is worse with weightbearing and standing and time on her feet.  Review of Systems  Constitutional: Negative.   HENT: Negative.    Eyes: Negative.   Respiratory: Negative.    Cardiovascular: Negative.   Endocrine: Negative.   Musculoskeletal: Negative.   Neurological: Negative.   Hematological: Negative.   Psychiatric/Behavioral: Negative.    All other systems reviewed and are negative.    Objective: Vital Signs: There were no vitals taken for this visit.  Physical Exam Vitals and nursing note reviewed.  Constitutional:       Appearance: She is well-developed.  HENT:     Head: Atraumatic.     Nose: Nose normal.  Eyes:     Extraocular Movements: Extraocular movements intact.  Cardiovascular:     Pulses: Normal pulses.  Pulmonary:     Effort: Pulmonary effort is normal.  Abdominal:     Palpations: Abdomen is soft.  Musculoskeletal:     Cervical back: Neck supple.  Skin:    General: Skin is warm.     Capillary Refill: Capillary refill takes less than 2 seconds.  Neurological:     Mental Status: She is alert. Mental status is at baseline.  Psychiatric:        Behavior: Behavior normal.        Thought Content: Thought content normal.        Judgment: Judgment normal.     Ortho Exam Examination of left ankle shows moderate to lateral foot and ankle swelling.  No neurovascular compromise.  Gentle range of motion is well-tolerated. Specialty Comments:  No specialty comments available.  Imaging: DG Foot Complete Left  Result Date: 05/17/2022 CLINICAL DATA:  Patient believes she had a twisting injury to left ankle/foot stepping off a porch this morning. Unable to bear weight. EXAM: LEFT FOOT -  COMPLETE 3+ VIEW COMPARISON:  None Available. FINDINGS: Fracture from the anterior aspect of calcaneus is only subtly suggested on the current lateral view, better seen on the ankle radiographs. No other evidence of a fracture. Joints are normally spaced and aligned. Mild soft tissue swelling lateral to hindfoot. IMPRESSION: 1. Subtle fracture of the anterior calcaneus, better appreciated as an avulsion fracture on the ankle radiographs. Electronically Signed   By: Lajean Manes M.D.   On: 05/17/2022 13:04   DG Ankle Complete Left  Result Date: 05/17/2022 CLINICAL DATA:  Patient believes she had a twisting injury to left ankle stepping off a porch this morning. Unable to bear weight. EXAM: LEFT ANKLE COMPLETE - 3+ VIEW COMPARISON:  None Available. FINDINGS: Small avulsion fracture noted along the lateral aspect the  hindfoot, 2.5 cm below the tip of the lateral malleolus, suspected to be extensor digitorum brevis avulsion fracture. No other evidence of a fracture. Ankle joint normally spaced and aligned. There is soft tissue swelling lateral to above described avulsion fracture. IMPRESSION: 1. Small avulsion fracture at appears to be from the anterior calcaneus at the origin of the extensor digitorum brevis tendon. 2. No other evidence of a fracture.  Ankle joint normally aligned. Electronically Signed   By: Lajean Manes M.D.   On: 05/17/2022 13:03     PMFS History: Patient Active Problem List   Diagnosis Date Noted   Pain in left ankle and joints of left foot 05/18/2022   Hepatic steatosis 12/31/2021   Chest pain 12/30/2021   Epigastric pain 12/30/2021   COVID-19 07/23/2021   Dizziness 10/30/2020   Herpes zoster without complication [id number]   Flank pain 05/09/2019   Allergic rhinitis 09/17/2018   Screen for colon cancer 04/03/2017   Stress incontinence of urine 07/07/2016   Shoulder pain 06/20/2016   History of asthma 04/24/2015   Depression with anxiety 04/24/2015   Arthralgia of both knees 04/24/2015   Diverticulosis of colon without hemorrhage 04/24/2015   Headache 04/24/2015   GERD (gastroesophageal reflux disease) 04/24/2015   History of gastric ulcer 04/24/2015   Benign essential HTN 04/24/2015   HLD (hyperlipidemia) 04/24/2015   History of nephrolithiasis 04/24/2015   Overweight 04/24/2015   Encounter to establish care 04/24/2015   Past Medical History:  Diagnosis Date   Asthma    Depression    Diverticulitis    Frequent headaches    GERD (gastroesophageal reflux disease)    History of kidney stones    History of stomach ulcers history of mouth ulcers   Hyperlipidemia    Hypertension    Urinary tract bacterial infections     Family History  Problem Relation Age of Onset   Arthritis Mother    Cancer Mother        Breast Cancer   Heart disease Mother        Mitral  valve disease   Hyperlipidemia Father    Heart disease Father    Stroke Father    Hypertension Father    Arthritis Maternal Grandmother    Mental illness Maternal Grandmother    Cancer Maternal Grandfather        Colon/ Prostate Cancer   Heart disease Maternal Grandfather    Stroke Maternal Grandfather    Diabetes Maternal Grandfather    Arthritis Paternal Grandmother    Hypertension Paternal Grandmother        renal artery stenosis   Heart attack Paternal Grandfather    Hypertension Sister    Heart disease Sister  tachycardia s/p ablation    Past Surgical History:  Procedure Laterality Date   ABDOMINAL HYSTERECTOMY  2005   Uterus only   [place]  2012   LEEP     TONSILLECTOMY     TONSILLECTOMY AND ADENOIDECTOMY  1989   Social History   Occupational History   Not on file  Tobacco Use   Smoking status: Never   Smokeless tobacco: Never  Vaping Use   Vaping Use: Never used  Substance and Sexual Activity   Alcohol use: Yes    Comment: occasionally   Drug use: No   Sexual activity: Not on file

## 2022-05-27 DIAGNOSIS — N959 Unspecified menopausal and perimenopausal disorder: Secondary | ICD-10-CM | POA: Diagnosis not present

## 2022-06-03 DIAGNOSIS — A09 Infectious gastroenteritis and colitis, unspecified: Secondary | ICD-10-CM | POA: Diagnosis not present

## 2022-06-06 ENCOUNTER — Telehealth: Payer: BLUE CROSS/BLUE SHIELD | Admitting: Family

## 2022-06-06 DIAGNOSIS — K649 Unspecified hemorrhoids: Secondary | ICD-10-CM

## 2022-06-06 MED ORDER — HYDROCORTISONE ACETATE 25 MG RE SUPP: 25.0000 mg | Freq: Two times a day (BID) | RECTAL | 0 refills | Status: DC

## 2022-06-06 NOTE — Progress Notes (Signed)

## 2022-06-08 ENCOUNTER — Ambulatory Visit: Payer: BLUE CROSS/BLUE SHIELD | Admitting: Family

## 2022-06-12 ENCOUNTER — Other Ambulatory Visit: Payer: Self-pay | Admitting: Family

## 2022-06-12 DIAGNOSIS — F418 Other specified anxiety disorders: Secondary | ICD-10-CM

## 2022-07-10 DIAGNOSIS — N959 Unspecified menopausal and perimenopausal disorder: Secondary | ICD-10-CM | POA: Diagnosis not present

## 2022-07-20 DIAGNOSIS — N959 Unspecified menopausal and perimenopausal disorder: Secondary | ICD-10-CM | POA: Diagnosis not present

## 2022-08-03 ENCOUNTER — Encounter: Payer: Self-pay | Admitting: Family

## 2022-08-03 ENCOUNTER — Ambulatory Visit: Payer: BC Managed Care – PPO | Admitting: Family

## 2022-08-03 VITALS — BP 108/70 | HR 86 | Temp 97.7°F | Ht 63.0 in | Wt 139.0 lb

## 2022-08-03 DIAGNOSIS — D721 Eosinophilia, unspecified: Secondary | ICD-10-CM | POA: Diagnosis not present

## 2022-08-03 DIAGNOSIS — Z8261 Family history of arthritis: Secondary | ICD-10-CM

## 2022-08-03 DIAGNOSIS — I10 Essential (primary) hypertension: Secondary | ICD-10-CM | POA: Diagnosis not present

## 2022-08-03 DIAGNOSIS — N83202 Unspecified ovarian cyst, left side: Secondary | ICD-10-CM

## 2022-08-03 DIAGNOSIS — K760 Fatty (change of) liver, not elsewhere classified: Secondary | ICD-10-CM

## 2022-08-03 DIAGNOSIS — L900 Lichen sclerosus et atrophicus: Secondary | ICD-10-CM

## 2022-08-03 DIAGNOSIS — M2550 Pain in unspecified joint: Secondary | ICD-10-CM

## 2022-08-03 DIAGNOSIS — F418 Other specified anxiety disorders: Secondary | ICD-10-CM

## 2022-08-03 DIAGNOSIS — Z636 Dependent relative needing care at home: Secondary | ICD-10-CM

## 2022-08-03 DIAGNOSIS — J4520 Mild intermittent asthma, uncomplicated: Secondary | ICD-10-CM | POA: Diagnosis not present

## 2022-08-03 DIAGNOSIS — N200 Calculus of kidney: Secondary | ICD-10-CM

## 2022-08-03 DIAGNOSIS — R7303 Prediabetes: Secondary | ICD-10-CM

## 2022-08-03 LAB — COMPREHENSIVE METABOLIC PANEL
ALT: 22 U/L (ref 0–35)
AST: 22 U/L (ref 0–37)
Albumin: 4.8 g/dL (ref 3.5–5.2)
Alkaline Phosphatase: 75 U/L (ref 39–117)
BUN: 16 mg/dL (ref 6–23)
CO2: 26 mEq/L (ref 19–32)
Calcium: 9.3 mg/dL (ref 8.4–10.5)
Chloride: 105 mEq/L (ref 96–112)
Creatinine, Ser: 0.7 mg/dL (ref 0.40–1.20)
GFR: 100.42 mL/min (ref >60.00)
Glucose, Bld: 109 mg/dL — ABNORMAL HIGH (ref 70–99)
Potassium: 4.7 mEq/L (ref 3.5–5.1)
Sodium: 140 mEq/L (ref 135–145)
Total Bilirubin: 0.4 mg/dL (ref 0.2–1.2)
Total Protein: 7.5 g/dL (ref 6.0–8.3)

## 2022-08-03 LAB — CBC WITH DIFFERENTIAL/PLATELET
Basophils Absolute: 0.1 10*3/uL (ref 0.0–0.1)
Basophils Relative: 0.9 % (ref 0.0–3.0)
Eosinophils Absolute: 0.2 10*3/uL (ref 0.0–0.7)
Eosinophils Relative: 3.7 % (ref 0.0–5.0)
HCT: 42 % (ref 36.0–46.0)
Hemoglobin: 14.2 g/dL (ref 12.0–15.0)
Lymphocytes Relative: 29.7 % (ref 12.0–46.0)
Lymphs Abs: 1.8 10*3/uL (ref 0.7–4.0)
MCHC: 33.8 g/dL (ref 30.0–36.0)
MCV: 86.7 fl (ref 78.0–100.0)
Monocytes Absolute: 0.5 10*3/uL (ref 0.1–1.0)
Monocytes Relative: 8.5 % (ref 3.0–12.0)
Neutro Abs: 3.4 10*3/uL (ref 1.4–7.7)
Neutrophils Relative %: 57.2 % (ref 43.0–77.0)
Platelets: 320 10*3/uL (ref 150.0–400.0)
RBC: 4.84 Mil/uL (ref 3.87–5.11)
RDW: 13.5 % (ref 11.5–15.5)
WBC: 6 10*3/uL (ref 4.0–10.5)

## 2022-08-03 LAB — HEMOGLOBIN A1C: Hgb A1c MFr Bld: 6.1 % (ref 4.6–6.5)

## 2022-08-03 LAB — MICROALBUMIN / CREATININE URINE RATIO
Creatinine,U: 63.9 mg/dL
Microalb Creat Ratio: 1.1 mg/g (ref 0.0–30.0)
Microalb, Ur: <0.7 mg/dL (ref 0.0–1.9)

## 2022-08-03 LAB — SEDIMENTATION RATE: Sed Rate: 17 mm/hr (ref 0–30)

## 2022-08-03 MED ORDER — SERTRALINE HCL 100 MG PO TABS: 100.0000 mg | ORAL_TABLET | Freq: Every day | ORAL | 1 refills | Status: DC

## 2022-08-03 NOTE — Patient Instructions (Addendum)
------------------------------------    A referral was placed today for psychology as well as psychiatry.  Please let us know if you have not heard back within 2 weeks about the referral.  A referral was placed today for gynecology. Please let us know if you have not heard back within 2 weeks about the referral.   ------------------------------------   Welcome to our clinic, I am happy to have you as my new patient. I am excited to continue on this healthcare journey with you.  Stop by the lab prior to leaving today. I will notify you of your results once received.   Please keep in mind Any my chart messages you send have up to a three business day turnaround for a response.  Phone calls may take up to a one full business day turnaround for a  response.   If you need a medication refill I recommend you request it through the pharmacy as this is easiest for [place] rather than sending a message and or phone call.   Due to recent changes in healthcare laws, you may see results of your imaging and/or laboratory studies on MyChart before I have had a chance to review them.  I understand that in some cases there may be results that are confusing or concerning to you. Please understand that not all results are received at the same time and often I may need to interpret multiple results in order to provide you with the best plan of care or course of treatment. Therefore, I ask that you please give me 2 business days to thoroughly review all your results before contacting my office for clarification. Should we see a critical lab result, you will be contacted sooner.   It was a pleasure seeing you today! Please do not hesitate to reach out with any questions and or concerns.  Regards,   Eugenia Pancoast FNP-C

## 2022-08-03 NOTE — Progress Notes (Signed)
Established Patient Office Visit  Subjective:  Patient ID: Annette Gonzales, female    DOB: 1970-12-08  Age: 52 y.o. MRN: [id number]  CC:  Chief Complaint  Patient presents with   Transitions Of Care    HPI Annette Gonzales is here for a transition of care visit.  Prior provider JIR:CVELFYBO Arnett FNP-C  Pt is without acute concerns.   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   C/o polyarthralgia, going through red light therapy right now which has been beneficial. Typical to have bil knee and bil hip pain and lower back. Also ankles at times with weakness and tenderness. Constantly needing to 'pop her ankles'. She was referred to rheumatology but she didn't make appointment.   chronic concerns:  HTN: lisinopril 10 mg once daily, sometimes forgets. Denies cp palp and or sob.   2019, diagnosed with lichen sclerosis, did have biopsy with gyn in the past.   Anxiety depression: had tried lexapro in the past however stopped working, now on sertraline 100 mg once daily and seems to feel stable however she does admit hard to turn off her anxiety and thoughts at night time. In the past used klonipin for breakthrough anxiety (is taking her husbands right now in full disclosure) Uses klonipin about three nights a week.      08/03/2022   11:22 AM 12/30/2021   11:35 AM 05/12/2021   12:15 PM  PHQ9 SCORE ONLY  PHQ-9 Total Score 3 0 2      12/30/2021   11:35 AM  GAD 7 : Generalized Anxiety Score  Nervous, Anxious, on Edge 0  Control/stop worrying 0  Worry too much - different things 0  Trouble relaxing 0  Restless 0  Easily annoyed or irritable 0  Afraid - awful might happen 0  Total GAD 7 Score 0  Anxiety Difficulty Not difficult at all   Fatty liver: trying to work on diet and exercise. Liver functions stable without elevation.   Past Medical History:  Diagnosis Date   Asthma    Depression    Diverticulitis    Frequent headaches    GERD  (gastroesophageal reflux disease)    History of kidney stones    History of stomach ulcers history of mouth ulcers   Hyperlipidemia    Hypertension    Urinary tract bacterial infections     Past Surgical History:  Procedure Laterality Date   ABDOMINAL HYSTERECTOMY  2005   Uterus only   APPENDECTOMY  1985   HERNIA REPAIR  2012   LEEP     TONSILLECTOMY     TONSILLECTOMY AND ADENOIDECTOMY  1989    Family History  Problem Relation Age of Onset   Arthritis Mother    Cancer Mother        Breast Cancer   Heart disease Mother        Mitral valve disease   Hyperlipidemia Father    Heart disease Father    Stroke Father    Hypertension Father    Arthritis Maternal Grandmother    Mental illness Maternal Grandmother    Cancer Maternal Grandfather        Colon/ Prostate Cancer   Heart disease Maternal Grandfather    Stroke Maternal Grandfather    Diabetes Maternal Grandfather    Arthritis Paternal Grandmother    Hypertension Paternal Grandmother        renal artery stenosis   Heart attack Paternal Grandfather    Hypertension Sister  Heart disease Sister        tachycardia s/p ablation    Social History   Socioeconomic History   Marital status: Married    Spouse name: Not on file   Number of children: Not on file   Years of education: Not on file   Highest education level: Not on file  Occupational History   Occupation: unemployed    Comment: previous patient care coordinator  Tobacco Use   Smoking status: Never   Smokeless tobacco: Never  Vaping Use   Vaping Use: Never used  Substance and Sexual Activity   Alcohol use: Yes    Comment: occasionally   Drug use: No   Sexual activity: Yes    Partners: Male  Other Topics Concern   Not on file  Social History Narrative   Married   unemployed   Some Secretary/administrator    2 children   Social Determinants of Health   Financial Resource Strain: Not on file  Food Insecurity: Not on file  Transportation Needs: Not on file   Physical Activity: Not on file  Stress: Not on file  Social Connections: Not on file  Intimate Partner Violence: Not on file    Outpatient Medications Prior to Visit  Medication Sig Dispense Refill   albuterol (VENTOLIN HFA) 108 (90 Base) MCG/ACT inhaler Inhale 2 puffs into the lungs every 6 (six) hours as needed for wheezing or shortness of breath. 8 g 0   fluticasone (FLONASE) 50 MCG/ACT nasal spray Place 2 sprays into both nostrils daily. 16 g 2   lisinopril (ZESTRIL) 10 MG tablet Take 1 tablet (10 mg total) by mouth daily. 90 tablet 2   montelukast (SINGULAIR) 10 MG tablet Take 1 tablet (10 mg total) by mouth at bedtime. 90 tablet 0   sertraline (ZOLOFT) 100 MG tablet Take 1 tablet by mouth at bedtime. 90 tablet 0   guaiFENesin (MUCINEX) 600 MG 12 hr tablet Take 2 tablets (1,200 mg total) by mouth 2 (two) times daily. 28 tablet 3   hydrocortisone (ANUSOL-HC) 25 MG suppository Place 1 suppository (25 mg total) rectally 2 (two) times daily. 12 suppository 0   lidocaine (XYLOCAINE) 2 % solution Use as directed 15 mLs in the mouth or throat as needed for mouth pain. 200 mL 0   promethazine-dextromethorphan (PROMETHAZINE-DM) 6.25-15 MG/5ML syrup Take 5 mLs by mouth 4 (four) times daily as needed for cough. 118 mL 0   traZODone (DESYREL) 50 MG tablet Take 1 tablet (50 mg total) by mouth at bedtime. (Patient not taking: Reported on 08/03/2022) 30 tablet 3   No facility-administered medications prior to visit.    Allergies  Allergen Reactions   Sulfa Antibiotics Other (See Comments)    Nausea and hot flashes    ROS Negative unless noted in HPI    Objective:    Physical Exam Vitals reviewed.  Constitutional:      Appearance: Normal appearance.  Eyes:     General:        Right eye: No discharge.        Left eye: No discharge.     Conjunctiva/sclera: Conjunctivae normal.  Cardiovascular:     Rate and Rhythm: Normal rate.  Pulmonary:     Effort: Pulmonary effort is normal. No  respiratory distress.  Musculoskeletal:        General: Normal range of motion.     Cervical back: Normal range of motion.  Neurological:     General: No focal deficit present.  Mental Status: She is alert and oriented to person, place, and time. Mental status is at baseline.  Psychiatric:        Mood and Affect: Mood normal.        Behavior: Behavior normal.        Thought Content: Thought content normal.        Judgment: Judgment normal.     BP 108/70 (BP Location: Left Arm, Patient Position: Sitting, Cuff Size: Normal)   Pulse 86   Temp 97.7 F (36.5 C) (Temporal)   Ht 5\' 3"  (1.6 m)   Wt 139 lb (63 kg)   SpO2 95%   BMI 24.62 kg/m  Wt Readings from Last 3 Encounters:  08/03/22 139 lb (63 kg)  05/17/22 126 lb 15.8 oz (57.6 kg)  12/30/21 127 lb (57.6 kg)     Health Maintenance Due  Topic Date Due   Hepatitis C Screening  Never done   DTaP/Tdap/Td (1 - Tdap) Never done   PAP SMEAR-Modifier  07/25/2020   Zoster Vaccines- Shingrix (1 of 2) Never done   COLONOSCOPY (Pts 45-58yrs Insurance coverage will need to be confirmed)  11/27/2021    There are no preventive care reminders to display for this patient.  Lab Results  Component Value Date   TSH 2.24 11/09/2020   Lab Results  Component Value Date   WBC 5.6 12/30/2021   HGB 13.8 12/30/2021   HCT 41.6 12/30/2021   MCV 87.8 12/30/2021   PLT 310.0 12/30/2021   Lab Results  Component Value Date   NA 140 12/30/2021   K 4.5 12/30/2021   CO2 26 12/30/2021   GLUCOSE 93 12/30/2021   BUN 16 12/30/2021   CREATININE 0.75 12/30/2021   BILITOT 0.5 12/30/2021   ALKPHOS 70 12/30/2021   AST 19 12/30/2021   ALT 17 12/30/2021   PROT 6.8 12/30/2021   ALBUMIN 4.3 12/30/2021   CALCIUM 9.0 12/30/2021   ANIONGAP 5 04/21/2016   GFR 92.83 12/30/2021   Lab Results  Component Value Date   CHOL 198 11/09/2020   Lab Results  Component Value Date   HDL 40 (L) 11/09/2020   Lab Results  Component Value Date   LDLCALC  126 (H) 11/09/2020   Lab Results  Component Value Date   TRIG 199 (H) 11/09/2020   Lab Results  Component Value Date   CHOLHDL 5.0 (H) 11/09/2020   Lab Results  Component Value Date   HGBA1C 5.7 12/30/2021      Assessment & Plan:   Ovarian cyst, left Assessment & Plan: Stable. Asymptomatic Unchanged    Kidney stone on left side Assessment & Plan: Stable reviewed CT June 2023   Prediabetes -     Hemoglobin A1c  Mild intermittent asthma without complication  Hepatic steatosis Assessment & Plan: Pt advised to work on diet and exercise.    Depression with anxiety Assessment & Plan: Increase sertraline 100 mg daily to 200 mg nightly.  Work on anxiety reducing techniques.  She is curious if possible ADD, as she loses focus and feels very scatter brain at times.   Orders: -     Sertraline HCl; Take 1 tablet (100 mg total) by mouth daily.  Dispense: 90 tablet; Refill: 1 -     Ambulatory referral to Psychology -     Ambulatory referral to Psychiatry  Caregiver burden -     Ambulatory referral to Psychiatry  Lichen sclerosus -     Ambulatory referral to Obstetrics / Gynecology  Polyarthralgia Assessment & Plan: Sister with +r/a  Ana sed rate and RF ordered, last ordered resulted negative in 2020.  Pending results but due to symptoms will likely refer to rheumatology for eval/treat  Orders: -     Sedimentation rate -     Rheumatoid factor -     ANA  Family history of rheumatoid arthritis -     Sedimentation rate  Eosinophilia, unspecified type -     CBC with Differential/Platelet  Benign essential HTN -     Comprehensive metabolic panel -     Microalbumin / creatinine urine ratio     Follow-up: Return in about 6 months (around 02/01/2023) for f/u CPE.    Eugenia Pancoast, FNP

## 2022-08-03 NOTE — Assessment & Plan Note (Signed)
Stable reviewed CT June 2023

## 2022-08-03 NOTE — Assessment & Plan Note (Addendum)
Increase sertraline 100 mg daily to 200 mg nightly.  Work on anxiety reducing techniques.  She is curious if possible ADD, as she loses focus and feels very scatter brain at times.

## 2022-08-03 NOTE — Assessment & Plan Note (Signed)
Asymptomatic per pt.

## 2022-08-03 NOTE — Assessment & Plan Note (Signed)
Sister with +r/a  Ana sed rate and RF ordered, last ordered resulted negative in 2020.  Pending results but due to symptoms will likely refer to rheumatology for eval/treat

## 2022-08-03 NOTE — Assessment & Plan Note (Signed)
Pt advised to work on diet and exercise.

## 2022-08-03 NOTE — Assessment & Plan Note (Signed)
Stable. Asymptomatic Unchanged

## 2022-08-04 ENCOUNTER — Encounter: Payer: Self-pay | Admitting: *Deleted

## 2022-08-04 LAB — ANA: Anti Nuclear Antibody (ANA): NEGATIVE

## 2022-08-04 LAB — RHEUMATOID FACTOR: Rheumatoid fact SerPl-aCnc: <14 IU/mL (ref <14)

## 2022-08-05 ENCOUNTER — Encounter: Payer: Self-pay | Admitting: Family

## 2022-08-05 DIAGNOSIS — I10 Essential (primary) hypertension: Secondary | ICD-10-CM

## 2022-08-05 DIAGNOSIS — J309 Allergic rhinitis, unspecified: Secondary | ICD-10-CM

## 2022-08-05 DIAGNOSIS — F418 Other specified anxiety disorders: Secondary | ICD-10-CM

## 2022-08-05 MED ORDER — MONTELUKAST SODIUM 10 MG PO TABS: 10.0000 mg | ORAL_TABLET | Freq: Every day | ORAL | 0 refills | Status: DC

## 2022-08-05 MED ORDER — SERTRALINE HCL 100 MG PO TABS: ORAL_TABLET | ORAL | 1 refills | Status: DC

## 2022-08-05 MED ORDER — LISINOPRIL 10 MG PO TABS: 10.0000 mg | ORAL_TABLET | Freq: Every day | ORAL | 2 refills | Status: DC

## 2022-08-05 MED ORDER — FLUTICASONE PROPIONATE 50 MCG/ACT NA SUSP: 2.0000 | Freq: Every day | NASAL | 2 refills | Status: AC

## 2022-08-11 ENCOUNTER — Encounter: Payer: Self-pay | Admitting: Family

## 2022-08-11 ENCOUNTER — Other Ambulatory Visit: Payer: Self-pay

## 2022-08-11 DIAGNOSIS — G479 Sleep disorder, unspecified: Secondary | ICD-10-CM

## 2022-08-11 DIAGNOSIS — F418 Other specified anxiety disorders: Secondary | ICD-10-CM

## 2022-08-11 MED ORDER — TRAZODONE HCL 50 MG PO TABS: 25.0000 mg | ORAL_TABLET | Freq: Every evening | ORAL | 0 refills | Status: DC | PRN

## 2022-08-12 ENCOUNTER — Other Ambulatory Visit: Payer: Self-pay | Admitting: Family

## 2022-08-15 MED ORDER — HYDROXYZINE PAMOATE 50 MG PO CAPS: 50.0000 mg | ORAL_CAPSULE | Freq: Three times a day (TID) | ORAL | 0 refills | Status: DC | PRN

## 2022-08-15 NOTE — Addendum Note (Signed)
Addended by: Eugenia Pancoast on: 08/15/2022 01:09 PM   Modules accepted: Orders

## 2022-08-16 ENCOUNTER — Encounter: Payer: Self-pay | Admitting: Family

## 2022-08-16 DIAGNOSIS — U071 COVID-19: Secondary | ICD-10-CM

## 2022-08-16 DIAGNOSIS — J4520 Mild intermittent asthma, uncomplicated: Secondary | ICD-10-CM

## 2022-08-17 MED ORDER — ALBUTEROL SULFATE HFA 108 (90 BASE) MCG/ACT IN AERS: 2.0000 | INHALATION_SPRAY | Freq: Four times a day (QID) | RESPIRATORY_TRACT | 0 refills | Status: DC | PRN

## 2022-08-17 NOTE — Addendum Note (Signed)
Addended by: Eugenia Pancoast on: 08/17/2022 11:48 AM   Modules accepted: Orders

## 2022-08-17 NOTE — Telephone Encounter (Signed)
Ok to refill 

## 2022-08-17 NOTE — Telephone Encounter (Signed)
Refilled

## 2022-08-18 NOTE — Telephone Encounter (Signed)
I have called patient states that the one we called in it will not cover because it has Ventolin. She states that it will go through if it is the pro air. I have reviewed patient chart and pended the one that it will cover that she got last with [facility].

## 2022-08-18 NOTE — Telephone Encounter (Signed)
Please call pt I think she is confused.  She said she could only get albuterol with insurance, but she verified that is what is at pharmacy and can not be refilled.   Can we clarify does she need ventolin instead?  Albuterol has been sent and is at pharmacy currently.

## 2022-08-18 NOTE — Addendum Note (Signed)
Addended by: Francella Solian on: 08/18/2022 04:18 PM   Modules accepted: Orders

## 2022-08-19 MED ORDER — ALBUTEROL SULFATE HFA 108 (90 BASE) MCG/ACT IN AERS: 2.0000 | INHALATION_SPRAY | RESPIRATORY_TRACT | 0 refills | Status: DC | PRN

## 2022-08-19 NOTE — Addendum Note (Signed)
Addended by: Eugenia Pancoast on: 08/19/2022 02:31 PM   Modules accepted: Orders

## 2022-08-29 ENCOUNTER — Encounter: Payer: Self-pay | Admitting: Family

## 2022-09-07 ENCOUNTER — Ambulatory Visit (INDEPENDENT_AMBULATORY_CARE_PROVIDER_SITE_OTHER): Payer: BC Managed Care – PPO | Admitting: Clinical

## 2022-09-07 DIAGNOSIS — F411 Generalized anxiety disorder: Secondary | ICD-10-CM | POA: Diagnosis not present

## 2022-09-07 DIAGNOSIS — F330 Major depressive disorder, recurrent, mild: Secondary | ICD-10-CM

## 2022-09-07 NOTE — Progress Notes (Signed)
                Amahri Dengel, LCSW 
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
## 2022-09-08 ENCOUNTER — Encounter: Payer: Self-pay | Admitting: Family

## 2022-09-08 DIAGNOSIS — T753XXA Motion sickness, initial encounter: Secondary | ICD-10-CM

## 2022-09-09 MED ORDER — SCOPOLAMINE 1 MG/3DAYS TD PT72: 1.0000 | MEDICATED_PATCH | TRANSDERMAL | 0 refills | Status: DC

## 2022-09-16 ENCOUNTER — Ambulatory Visit (INDEPENDENT_AMBULATORY_CARE_PROVIDER_SITE_OTHER): Payer: BC Managed Care – PPO | Admitting: Clinical

## 2022-09-16 DIAGNOSIS — F411 Generalized anxiety disorder: Secondary | ICD-10-CM | POA: Diagnosis not present

## 2022-09-16 DIAGNOSIS — F330 Major depressive disorder, recurrent, mild: Secondary | ICD-10-CM | POA: Diagnosis not present

## 2022-09-16 NOTE — Progress Notes (Signed)
                Aroldo Galli, LCSW 

## 2022-09-16 NOTE — Progress Notes (Signed)
[facility] Counselor/Therapist Progress Note  Patient ID: ZANEA AIRINGTON, MRN: [id number],    Date: 09/16/2022  Time Spent: 9:45am - 10:32am : 47 minutes   Treatment Type: Individual Therapy  Reported Symptoms: Patient reported recent feelings of sadness and anger.   Mental Status Exam: Appearance:  Well Groomed     Behavior: Appropriate  Motor: Normal  Speech/Language:  Clear and Coherent  Affect: Appropriate  Mood: normal  Thought process: normal  Thought content:   WNL  Sensory/Perceptual disturbances:   WNL  Orientation: oriented to person, place, and situation  Attention: Good  Concentration: Good  Memory: WNL  Fund of knowledge:  Good  Insight:   Good  Judgment:  Good  Impulse Control: Good   Risk Assessment: Danger to Self:  No Patient denied current suicidal ideation Self-injurious Behavior: No Danger to Others: No Patient denied current homicidal ideation Duty to Warn:no Physical Aggression / Violence:No  Access to Firearms a concern: No  Gang Involvement:No   Subjective: Patient stated, "I guess they've been about the same" since last session.  Patient reported "quite a few sad days" since last session. Patient stated, "I'm ok" in response to current mood. Patient reported feelings of anger and sadness in response to the anniversary of her husband disclosing information that he had an affair. Patient reported grieving the loss of her dog and reported she relied on her dog for emotional support. Patient stated, "I completely understand why I would have every one of those" in response to the diagnoses. Patient reported she is open to a referral to a psychiatrist. Patient stated, "I know I need it" in response to treatment of trauma. Patient reported she is open to a support group for caregivers specific to dementia. Patient reported she is open to marital counseling and her husband has indicated he is willing to participate as well. Patient reported  she and husband participated in marital counseling in the past but feels they did not benefit from therapy at the time.   Interventions: Clinician conducted session via WebEx video from clinician's home office. Patient provided verbal consent to proceed with telehealth session and participated in session from patient's home. Reviewed events since last session. Clinician reviewed diagnoses and treatment recommendations. Provided psycho education related to diagnoses and treatment. Discussed recommendation for caregiver support groups specific to diagnosis of Dementia.  Clinician provided referral information caregiver support groups and resources for caregivers, such as, Alzheimer's Association, [facility] [place], [facility], and Family Caregiver Support Program.   Diagnosis:Generalized anxiety disorder  Mild episode of recurrent major depressive disorder ([place]) Unspecified trauma - and - stressor related disorder    Plan: Patient is being referred to a clinician that specializes in treatment of trauma.   Katherina Right, LCSW

## 2022-10-05 NOTE — Telephone Encounter (Signed)
Error

## 2022-10-24 ENCOUNTER — Other Ambulatory Visit: Payer: Self-pay | Admitting: Family

## 2022-10-24 DIAGNOSIS — Z1231 Encounter for screening mammogram for malignant neoplasm of breast: Secondary | ICD-10-CM

## 2022-10-26 ENCOUNTER — Ambulatory Visit: Payer: BC Managed Care – PPO

## 2022-10-27 DIAGNOSIS — F4312 Post-traumatic stress disorder, chronic: Secondary | ICD-10-CM | POA: Diagnosis not present

## 2022-10-27 DIAGNOSIS — F411 Generalized anxiety disorder: Secondary | ICD-10-CM | POA: Diagnosis not present

## 2022-10-27 DIAGNOSIS — F331 Major depressive disorder, recurrent, moderate: Secondary | ICD-10-CM | POA: Diagnosis not present

## 2022-11-01 ENCOUNTER — Ambulatory Visit: Payer: BC Managed Care – PPO | Admitting: Adult Health

## 2022-11-04 DIAGNOSIS — F411 Generalized anxiety disorder: Secondary | ICD-10-CM | POA: Diagnosis not present

## 2022-11-04 DIAGNOSIS — F4312 Post-traumatic stress disorder, chronic: Secondary | ICD-10-CM | POA: Diagnosis not present

## 2022-11-04 DIAGNOSIS — F331 Major depressive disorder, recurrent, moderate: Secondary | ICD-10-CM | POA: Diagnosis not present

## 2022-11-09 DIAGNOSIS — F331 Major depressive disorder, recurrent, moderate: Secondary | ICD-10-CM | POA: Diagnosis not present

## 2022-11-09 DIAGNOSIS — F411 Generalized anxiety disorder: Secondary | ICD-10-CM | POA: Diagnosis not present

## 2022-11-09 DIAGNOSIS — F4312 Post-traumatic stress disorder, chronic: Secondary | ICD-10-CM | POA: Diagnosis not present

## 2022-11-10 ENCOUNTER — Telehealth: Payer: BC Managed Care – PPO | Admitting: Physician Assistant

## 2022-11-10 DIAGNOSIS — B9689 Other specified bacterial agents as the cause of diseases classified elsewhere: Secondary | ICD-10-CM

## 2022-11-10 DIAGNOSIS — J208 Acute bronchitis due to other specified organisms: Secondary | ICD-10-CM | POA: Diagnosis not present

## 2022-11-10 MED ORDER — PROMETHAZINE-DM 6.25-15 MG/5ML PO SYRP: 5.0000 mL | ORAL_SOLUTION | Freq: Four times a day (QID) | ORAL | 0 refills | Status: DC | PRN

## 2022-11-10 MED ORDER — AZITHROMYCIN 250 MG PO TABS: ORAL_TABLET | ORAL | 0 refills | Status: AC

## 2022-11-10 MED ORDER — ALBUTEROL SULFATE HFA 108 (90 BASE) MCG/ACT IN AERS: 1.0000 | INHALATION_SPRAY | Freq: Four times a day (QID) | RESPIRATORY_TRACT | 0 refills | Status: DC | PRN

## 2022-11-10 MED ORDER — PREDNISONE 20 MG PO TABS: 40.0000 mg | ORAL_TABLET | Freq: Every day | ORAL | 0 refills | Status: DC

## 2022-11-10 NOTE — Progress Notes (Signed)
Virtual Visit Consent   [name], you are scheduled for a virtual visit with a Annette Gonzales today. Just as with appointments in the office, your consent must be obtained to participate. Your consent will be active for this visit and any virtual visit you may have with one of our providers in the next 365 days. If you have a MyChart account, a copy of this consent can be sent to you electronically.  As this is a virtual visit, video technology does not allow for your Gonzales to perform a traditional examination. This may limit your Gonzales's ability to fully assess your condition. If your Gonzales identifies any concerns that need to be evaluated in person or the need to arrange testing (such as labs, EKG, etc.), we will make arrangements to do so. Although advances in technology are sophisticated, we cannot ensure that it will always work on either your end or our end. If the connection with a video visit is poor, the visit may have to be switched to a telephone visit. With either a video or telephone visit, we are not always able to ensure that we have a secure connection.  By engaging in this virtual visit, you consent to the provision of healthcare and authorize for your insurance to be billed (if applicable) for the services provided during this visit. Depending on your insurance coverage, you may receive a charge related to this service.  I need to obtain your verbal consent now. Are you willing to proceed with your visit today? [name] has provided verbal consent on 11/10/2022 for a virtual visit (video or telephone). Margaretann Loveless, PA-C  Date: 11/10/2022 5:03 PM  Virtual Visit via Video Note   I, Margaretann Loveless, connected with  Annette Gonzales  ([id number], 1970/10/23) on 11/10/22 at  5:00 PM EDT by a video-enabled telemedicine application and verified that I am speaking with the correct person using two identifiers.  Location: Patient: Virtual Visit  Location Patient: Home Gonzales: Virtual Visit Location Gonzales: Home Office   I discussed the limitations of evaluation and management by telemedicine and the availability of in person appointments. The patient expressed understanding and agreed to proceed.    History of Present Illness: Annette Gonzales is a 52 y.o. who identifies as a female who was assigned female at birth, and is being seen today for cough.  HPI: Cough This is a new problem. The current episode started 1 to 4 weeks ago (worsened yesterday). The problem has been gradually worsening. The problem occurs constantly. The cough is Non-productive (deep, bronchial, painful). Associated symptoms include headaches, myalgias, nasal congestion, postnasal drip, rhinorrhea, a sore throat (did, but improved) and wheezing (with coughing only). Pertinent negatives include no chills, ear congestion, ear pain, fever or shortness of breath. The symptoms are aggravated by lying down. Treatments tried: promethazine dm, rest, benadryl, cough drops, flonase, steam, singulair. The treatment provided no relief. Her past medical history is significant for bronchitis.   Negative at home Covid 19 test   Problems:  Patient Active Problem List   Diagnosis Date Noted   Kidney stone on left side 08/03/2022   Ovarian cyst, left 08/03/2022   Prediabetes 08/03/2022   Mild intermittent asthma without complication 08/03/2022   Eosinophilia 08/03/2022   Polyarthralgia 08/03/2022   Lichen sclerosus 08/03/2022   Hepatic steatosis 12/31/2021   Allergic rhinitis 09/17/2018   Stress incontinence of urine 07/07/2016   Depression with anxiety 04/24/2015   Diverticulosis of colon without  hemorrhage 04/24/2015   GERD (gastroesophageal reflux disease) 04/24/2015   History of gastric ulcer 04/24/2015   Benign essential HTN 04/24/2015   HLD (hyperlipidemia) 04/24/2015   Overweight 04/24/2015    Allergies:  Allergies  Allergen Reactions   Sulfa Antibiotics  Other (See Comments)    Nausea and hot flashes   Medications:  Current Outpatient Medications:    albuterol (VENTOLIN HFA) 108 (90 Base) MCG/ACT inhaler, Inhale 1-2 puffs into the lungs every 6 (six) hours as needed for wheezing or shortness of breath., Disp: 18 g, Rfl: 0   azithromycin (ZITHROMAX) 250 MG tablet, Take 2 tablets on day 1, then 1 tablet daily on days 2 through 5, Disp: 6 tablet, Rfl: 0   predniSONE (DELTASONE) 20 MG tablet, Take 2 tablets (40 mg total) by mouth daily with breakfast., Disp: 10 tablet, Rfl: 0   promethazine-dextromethorphan (PROMETHAZINE-DM) 6.25-15 MG/5ML syrup, Take 5 mLs by mouth 4 (four) times daily as needed., Disp: 118 mL, Rfl: 0   scopolamine (TRANSDERM-SCOP) 1 MG/3DAYS, Place 1 patch (1.5 mg total) onto the skin every 3 (three) days., Disp: 4 patch, Rfl: 0   fluticasone (FLONASE) 50 MCG/ACT nasal spray, Place 2 sprays into both nostrils daily., Disp: 16 g, Rfl: 2   hydrOXYzine (VISTARIL) 50 MG capsule, Take 1 capsule (50 mg total) by mouth 3 (three) times daily as needed., Disp: 30 capsule, Rfl: 0   lisinopril (ZESTRIL) 10 MG tablet, Take 1 tablet (10 mg total) by mouth daily., Disp: 90 tablet, Rfl: 2   montelukast (SINGULAIR) 10 MG tablet, Take 1 tablet (10 mg total) by mouth at bedtime., Disp: 90 tablet, Rfl: 0   sertraline (ZOLOFT) 100 MG tablet, Take two tablets once daily, Disp: 180 tablet, Rfl: 1  Observations/Objective: Patient is well-developed, well-nourished in no acute distress.  Resting comfortably at home.  Head is normocephalic, atraumatic.  No labored breathing.  Speech is clear and coherent with logical content.  Patient is alert and oriented at baseline.    Assessment and Plan: 1. Acute bacterial bronchitis - azithromycin (ZITHROMAX) 250 MG tablet; Take 2 tablets on day 1, then 1 tablet daily on days 2 through 5  Dispense: 6 tablet; Refill: 0 - albuterol (VENTOLIN HFA) 108 (90 Base) MCG/ACT inhaler; Inhale 1-2 puffs into the lungs  every 6 (six) hours as needed for wheezing or shortness of breath.  Dispense: 18 g; Refill: 0 - promethazine-dextromethorphan (PROMETHAZINE-DM) 6.25-15 MG/5ML syrup; Take 5 mLs by mouth 4 (four) times daily as needed.  Dispense: 118 mL; Refill: 0 - predniSONE (DELTASONE) 20 MG tablet; Take 2 tablets (40 mg total) by mouth daily with breakfast.  Dispense: 10 tablet; Refill: 0  - Worsening over a week despite OTC medications - Will treat with Z-pack, albuterol, Prednisone and Promethazine DM - Can continue Mucinex  - Push fluids.  - Rest.  - Steam and humidifier can help - Seek in person evaluation if worsening or symptoms fail to improve    Follow Up Instructions: I discussed the assessment and treatment plan with the patient. The patient was provided an opportunity to ask questions and all were answered. The patient agreed with the plan and demonstrated an understanding of the instructions.  A copy of instructions were sent to the patient via MyChart unless otherwise noted below.    The patient was advised to call back or seek an in-person evaluation if the symptoms worsen or if the condition fails to improve as anticipated.  Time:  I spent 11 minutes with  the patient via telehealth technology discussing the above problems/concerns.    Mar Daring, PA-C

## 2022-11-10 NOTE — Patient Instructions (Signed)
Annette Gonzales, thank you for joining Margaretann Loveless, PA-C for today's virtual visit.  While this provider is not your primary care provider (PCP), if your PCP is located in our provider database this encounter information will be shared with them immediately following your visit.   A [facility] MyChart account gives you access to today's visit and all your visits, tests, and labs performed at [facility] " click here if you don't have a [facility] MyChart account or go to [email or web address]  Consent: (Patient) Annette Gonzales provided verbal consent for this virtual visit at the beginning of the encounter.  Current Medications:  Current Outpatient Medications:    albuterol (VENTOLIN HFA) 108 (90 Base) MCG/ACT inhaler, Inhale 1-2 puffs into the lungs every 6 (six) hours as needed for wheezing or shortness of breath., Disp: 18 g, Rfl: 0   azithromycin (ZITHROMAX) 250 MG tablet, Take 2 tablets on day 1, then 1 tablet daily on days 2 through 5, Disp: 6 tablet, Rfl: 0   predniSONE (DELTASONE) 20 MG tablet, Take 2 tablets (40 mg total) by mouth daily with breakfast., Disp: 10 tablet, Rfl: 0   promethazine-dextromethorphan (PROMETHAZINE-DM) 6.25-15 MG/5ML syrup, Take 5 mLs by mouth 4 (four) times daily as needed., Disp: 118 mL, Rfl: 0   scopolamine (TRANSDERM-SCOP) 1 MG/3DAYS, Place 1 patch (1.5 mg total) onto the skin every 3 (three) days., Disp: 4 patch, Rfl: 0   fluticasone (FLONASE) 50 MCG/ACT nasal spray, Place 2 sprays into both nostrils daily., Disp: 16 g, Rfl: 2   hydrOXYzine (VISTARIL) 50 MG capsule, Take 1 capsule (50 mg total) by mouth 3 (three) times daily as needed., Disp: 30 capsule, Rfl: 0   lisinopril (ZESTRIL) 10 MG tablet, Take 1 tablet (10 mg total) by mouth daily., Disp: 90 tablet, Rfl: 2   montelukast (SINGULAIR) 10 MG tablet, Take 1 tablet (10 mg total) by mouth at bedtime., Disp: 90 tablet, Rfl: 0   sertraline (ZOLOFT) 100 MG tablet, Take two tablets  once daily, Disp: 180 tablet, Rfl: 1   Medications ordered in this encounter:  Meds ordered this encounter  Medications   azithromycin (ZITHROMAX) 250 MG tablet    Sig: Take 2 tablets on day 1, then 1 tablet daily on days 2 through 5    Dispense:  6 tablet    Refill:  0    Order Specific Question:   Supervising Provider    Answer:   Merrilee Jansky [[id number]]   albuterol (VENTOLIN HFA) 108 (90 Base) MCG/ACT inhaler    Sig: Inhale 1-2 puffs into the lungs every 6 (six) hours as needed for wheezing or shortness of breath.    Dispense:  18 g    Refill:  0    Order Specific Question:   Supervising Provider    Answer:   Merrilee Jansky [id number]   promethazine-dextromethorphan (PROMETHAZINE-DM) 6.25-15 MG/5ML syrup    Sig: Take 5 mLs by mouth 4 (four) times daily as needed.    Dispense:  118 mL    Refill:  0    Order Specific Question:   Supervising Provider    Answer:   Merrilee Jansky [[id number]]   predniSONE (DELTASONE) 20 MG tablet    Sig: Take 2 tablets (40 mg total) by mouth daily with breakfast.    Dispense:  10 tablet    Refill:  0    Order Specific Question:   Supervising Provider    Answer:   Merrilee Jansky [[id number]]     *  If you need refills on other medications prior to your next appointment, please contact your pharmacy*  Follow-Up: Call back or seek an in-person evaluation if the symptoms worsen or if the condition fails to improve as anticipated.  [facility] [phone]  Other Instructions  Acute Bronchitis, Adult  Acute bronchitis is sudden inflammation of the main airways (bronchi) that come off the windpipe (trachea) in the lungs. The swelling causes the airways to get smaller and make more mucus than normal. This can make it hard to breathe and can cause coughing or noisy breathing (wheezing). Acute bronchitis may last several weeks. The cough may last longer. Allergies, asthma, and exposure to smoke may make the condition worse. What  are the causes? This condition can be caused by germs and by substances that irritate the lungs, including: Cold and flu viruses. The most common cause of this condition is the virus that causes the common cold. Bacteria. This is less common. Breathing in substances that irritate the lungs, including: Smoke from cigarettes and other forms of tobacco. Dust and pollen. Fumes from household cleaning products, gases, or burned fuel. Indoor or outdoor air pollution. What increases the risk? The following factors may make you more likely to develop this condition: A weak body's defense system, also called the immune system. A condition that affects your lungs and breathing, such as asthma. What are the signs or symptoms? Common symptoms of this condition include: Coughing. This may bring up clear, yellow, or green mucus from your lungs (sputum). Wheezing. Runny or stuffy nose. Having too much mucus in your lungs (chest congestion). Shortness of breath. Aches and pains, including sore throat or chest. How is this diagnosed? This condition is usually diagnosed based on: Your symptoms and medical history. A physical exam. You may also have other tests, including tests to rule out other conditions, such as pneumonia. These tests include: A test of lung function. Test of a mucus sample to look for the presence of bacteria. Tests to check the oxygen level in your blood. Blood tests. Chest X-ray. How is this treated? Most cases of acute bronchitis clear up over time without treatment. Your health care provider may recommend: Drinking more fluids to help thin your mucus so it is easier to cough up. Taking inhaled medicine (inhaler) to improve air flow in and out of your lungs. Using a vaporizer or a humidifier. These are machines that add water to the air to help you breathe better. Taking a medicine that thins mucus and clears congestion (expectorant). Taking a medicine that prevents or stops  coughing (cough suppressant). It is not common to take an antibiotic medicine for this condition. Follow these instructions at home:  Take over-the-counter and prescription medicines only as told by your health care provider. Use an inhaler, vaporizer, or humidifier as told by your health care provider. Take two teaspoons (10 mL) of honey at bedtime to lessen coughing at night. Drink enough fluid to keep your urine pale yellow. Do not use any products that contain nicotine or tobacco. These products include cigarettes, chewing tobacco, and vaping devices, such as e-cigarettes. If you need help quitting, ask your health care provider. Get plenty of rest. Return to your normal activities as told by your health care provider. Ask your health care provider what activities are safe for you. Keep all follow-up visits. This is important. How is this prevented? To lower your risk of getting this condition again: Wash your hands often with soap and  water for at least 20 seconds. If soap and water are not available, use hand sanitizer. Avoid contact with people who have cold symptoms. Try not to touch your mouth, nose, or eyes with your hands. Avoid breathing in smoke or chemical fumes. Breathing smoke or chemical fumes will make your condition worse. Get the flu shot every year. Contact a health care provider if: Your symptoms do not improve after 2 weeks. You have trouble coughing up the mucus. Your cough keeps you awake at night. You have a fever. Get help right away if you: Cough up blood. Feel pain in your chest. Have severe shortness of breath. Faint or keep feeling like you are going to faint. Have a severe headache. Have a fever or chills that get worse. These symptoms may represent a serious problem that is an emergency. Do not wait to see if the symptoms will go away. Get medical help right away. Call your local emergency services (911 in the [place] Do not drive yourself to the  hospital. Summary Acute bronchitis is inflammation of the main airways (bronchi) that come off the windpipe (trachea) in the lungs. The swelling causes the airways to get smaller and make more mucus than normal. Drinking more fluids can help thin your mucus so it is easier to cough up. Take over-the-counter and prescription medicines only as told by your health care provider. Do not use any products that contain nicotine or tobacco. These products include cigarettes, chewing tobacco, and vaping devices, such as e-cigarettes. If you need help quitting, ask your health care provider. Contact a health care provider if your symptoms do not improve after 2 weeks. This information is not intended to replace advice given to you by your health care provider. Make sure you discuss any questions you have with your health care provider. Document Revised: 10/21/2021 Document Reviewed: 11/11/2020 Elsevier Patient Education  2023 Elsevier Inc.    If you have been instructed to have an in-person evaluation today at a local Urgent Care facility, please use the link below. It will take you to a list of all of our available [facility], including address, phone number and hours of operation. Please do not delay care.  [facility]  If you or a family member do not have a primary care provider, use the link below to schedule a visit and establish care. When you choose a [facility] primary care physician or advanced practice provider, you gain a long-term partner in health. Find a Primary Care Provider  Learn more about [facility]'s in-office and virtual care options: [facility] - Get Care Now

## 2022-11-16 DIAGNOSIS — F331 Major depressive disorder, recurrent, moderate: Secondary | ICD-10-CM | POA: Diagnosis not present

## 2022-11-16 DIAGNOSIS — F4312 Post-traumatic stress disorder, chronic: Secondary | ICD-10-CM | POA: Diagnosis not present

## 2022-11-16 DIAGNOSIS — F411 Generalized anxiety disorder: Secondary | ICD-10-CM | POA: Diagnosis not present

## 2022-11-17 ENCOUNTER — Encounter: Payer: Self-pay | Admitting: Family

## 2022-11-17 DIAGNOSIS — F418 Other specified anxiety disorders: Secondary | ICD-10-CM

## 2022-11-17 DIAGNOSIS — J309 Allergic rhinitis, unspecified: Secondary | ICD-10-CM

## 2022-11-17 NOTE — Telephone Encounter (Signed)
sertraline (ZOLOFT) 100 MG tablet   LR- 08/05/22 ( 180 tabs/ 1 refill) LV- 08/03/22 NV- Not scheduled

## 2022-11-18 MED ORDER — SERTRALINE HCL 100 MG PO TABS: ORAL_TABLET | ORAL | 1 refills | Status: DC

## 2022-11-25 ENCOUNTER — Ambulatory Visit: Payer: BC Managed Care – PPO

## 2022-12-02 DIAGNOSIS — F411 Generalized anxiety disorder: Secondary | ICD-10-CM | POA: Diagnosis not present

## 2022-12-02 DIAGNOSIS — F4312 Post-traumatic stress disorder, chronic: Secondary | ICD-10-CM | POA: Diagnosis not present

## 2022-12-02 DIAGNOSIS — F331 Major depressive disorder, recurrent, moderate: Secondary | ICD-10-CM | POA: Diagnosis not present

## 2022-12-07 DIAGNOSIS — F4312 Post-traumatic stress disorder, chronic: Secondary | ICD-10-CM | POA: Diagnosis not present

## 2022-12-07 DIAGNOSIS — F331 Major depressive disorder, recurrent, moderate: Secondary | ICD-10-CM | POA: Diagnosis not present

## 2022-12-07 DIAGNOSIS — F4001 Agoraphobia with panic disorder: Secondary | ICD-10-CM | POA: Diagnosis not present

## 2022-12-15 DIAGNOSIS — F4310 Post-traumatic stress disorder, unspecified: Secondary | ICD-10-CM | POA: Diagnosis not present

## 2022-12-20 MED ORDER — MONTELUKAST SODIUM 10 MG PO TABS: 10.0000 mg | ORAL_TABLET | Freq: Every day | ORAL | 0 refills | Status: DC

## 2022-12-20 MED ORDER — LISINOPRIL 10 MG PO TABS: 10.0000 mg | ORAL_TABLET | Freq: Every day | ORAL | 2 refills | Status: DC

## 2022-12-20 NOTE — Addendum Note (Signed)
Addended by: Mort Sawyers on: 12/20/2022 03:30 PM   Modules accepted: Orders

## 2022-12-21 DIAGNOSIS — F4312 Post-traumatic stress disorder, chronic: Secondary | ICD-10-CM | POA: Diagnosis not present

## 2022-12-21 DIAGNOSIS — F4001 Agoraphobia with panic disorder: Secondary | ICD-10-CM | POA: Diagnosis not present

## 2022-12-21 DIAGNOSIS — F331 Major depressive disorder, recurrent, moderate: Secondary | ICD-10-CM | POA: Diagnosis not present

## 2022-12-21 MED ORDER — MONTELUKAST SODIUM 10 MG PO TABS: 10.0000 mg | ORAL_TABLET | Freq: Every day | ORAL | 0 refills | Status: DC

## 2022-12-21 MED ORDER — LISINOPRIL 10 MG PO TABS: 10.0000 mg | ORAL_TABLET | Freq: Every day | ORAL | 2 refills | Status: DC

## 2022-12-21 NOTE — Addendum Note (Signed)
Addended by: Mort Sawyers on: 12/21/2022 12:25 PM   Modules accepted: Orders

## 2022-12-22 DIAGNOSIS — F4310 Post-traumatic stress disorder, unspecified: Secondary | ICD-10-CM | POA: Diagnosis not present

## 2022-12-26 ENCOUNTER — Other Ambulatory Visit: Payer: Self-pay

## 2022-12-26 ENCOUNTER — Emergency Department: Payer: BC Managed Care – PPO

## 2022-12-26 ENCOUNTER — Emergency Department
Admission: EM | Admit: 2022-12-26 | Discharge: 2022-12-26 | Disposition: A | Discharge status: Home / Self Care | Payer: BC Managed Care – PPO | Attending: Emergency Medicine | Admitting: Emergency Medicine

## 2022-12-26 DIAGNOSIS — J45909 Unspecified asthma, uncomplicated: Secondary | ICD-10-CM | POA: Insufficient documentation

## 2022-12-26 DIAGNOSIS — R7989 Other specified abnormal findings of blood chemistry: Secondary | ICD-10-CM | POA: Diagnosis not present

## 2022-12-26 DIAGNOSIS — K529 Noninfective gastroenteritis and colitis, unspecified: Secondary | ICD-10-CM | POA: Diagnosis not present

## 2022-12-26 DIAGNOSIS — K5730 Diverticulosis of large intestine without perforation or abscess without bleeding: Secondary | ICD-10-CM | POA: Diagnosis not present

## 2022-12-26 DIAGNOSIS — R109 Unspecified abdominal pain: Secondary | ICD-10-CM | POA: Diagnosis not present

## 2022-12-26 DIAGNOSIS — D72829 Elevated white blood cell count, unspecified: Secondary | ICD-10-CM | POA: Insufficient documentation

## 2022-12-26 DIAGNOSIS — I10 Essential (primary) hypertension: Secondary | ICD-10-CM | POA: Diagnosis not present

## 2022-12-26 LAB — URINALYSIS, ROUTINE W REFLEX MICROSCOPIC
Bilirubin Urine: NEGATIVE
Glucose, UA: NEGATIVE mg/dL
Hgb urine dipstick: NEGATIVE
Ketones, ur: 5 mg/dL — AB
Nitrite: NEGATIVE
Protein, ur: NEGATIVE mg/dL
Specific Gravity, Urine: 1.029 (ref 1.005–1.030)
pH: 5 (ref 5.0–8.0)

## 2022-12-26 LAB — CBC
HCT: 45.8 % (ref 36.0–46.0)
Hemoglobin: 15.2 g/dL — ABNORMAL HIGH (ref 12.0–15.0)
MCH: 28.7 pg (ref 26.0–34.0)
MCHC: 33.2 g/dL (ref 30.0–36.0)
MCV: 86.6 fL (ref 80.0–100.0)
Platelets: 364 10*3/uL (ref 150–400)
RBC: 5.29 MIL/uL — ABNORMAL HIGH (ref 3.87–5.11)
RDW: 13.8 % (ref 11.5–15.5)
WBC: 16.2 10*3/uL — ABNORMAL HIGH (ref 4.0–10.5)
nRBC: 0 % (ref 0.0–0.2)

## 2022-12-26 LAB — COMPREHENSIVE METABOLIC PANEL
ALT: 19 U/L (ref 0–44)
AST: 21 U/L (ref 15–41)
Albumin: 4.5 g/dL (ref 3.5–5.0)
Alkaline Phosphatase: 82 U/L (ref 38–126)
Anion gap: 9 (ref 5–15)
BUN: 21 mg/dL — ABNORMAL HIGH (ref 6–20)
CO2: 24 mmol/L (ref 22–32)
Calcium: 8.8 mg/dL — ABNORMAL LOW (ref 8.9–10.3)
Chloride: 103 mmol/L (ref 98–111)
Creatinine, Ser: 0.78 mg/dL (ref 0.44–1.00)
GFR, Estimated: >60 mL/min (ref >60)
Glucose, Bld: 167 mg/dL — ABNORMAL HIGH (ref 70–99)
Potassium: 4 mmol/L (ref 3.5–5.1)
Sodium: 136 mmol/L (ref 135–145)
Total Bilirubin: 0.7 mg/dL (ref 0.3–1.2)
Total Protein: 8 g/dL (ref 6.5–8.1)

## 2022-12-26 LAB — LIPASE, BLOOD: Lipase: 56 U/L — ABNORMAL HIGH (ref 11–51)

## 2022-12-26 MED ORDER — ONDANSETRON HCL 4 MG/2ML IJ SOLN
4.0000 mg | Freq: Once | INTRAMUSCULAR | Status: AC
  Administered 2022-12-26: 4 mg via INTRAVENOUS
  Filled 2022-12-26: qty 2

## 2022-12-26 MED ORDER — HYDROMORPHONE HCL 1 MG/ML IJ SOLN
0.5000 mg | Freq: Once | INTRAMUSCULAR | Status: AC
  Administered 2022-12-26: 0.5 mg via INTRAVENOUS
  Filled 2022-12-26: qty 0.5

## 2022-12-26 MED ORDER — IOHEXOL 300 MG/ML  SOLN
100.0000 mL | Freq: Once | INTRAMUSCULAR | Status: AC | PRN
  Administered 2022-12-26: 100 mL via INTRAVENOUS

## 2022-12-26 MED ORDER — ONDANSETRON 4 MG PO TBDP: 4.0000 mg | ORAL_TABLET | Freq: Three times a day (TID) | ORAL | 0 refills | Status: DC | PRN

## 2022-12-26 MED ORDER — SODIUM CHLORIDE 0.9 % IV BOLUS
1000.0000 mL | Freq: Once | INTRAVENOUS | Status: AC
  Administered 2022-12-26: 1000 mL via INTRAVENOUS

## 2022-12-26 NOTE — Discharge Instructions (Addendum)
Return to the ER for new, worsening, or persistent severe vomiting, abdominal pain, fever, weakness, or any other new or worsening symptoms that concern you. 

## 2022-12-26 NOTE — ED Provider Notes (Signed)
[facility] Provider Note    None    (approximate)   History   Abdominal Pain (Since 6pm)   HPI  Annette Gonzales is a 52 y.o. female with a history of ureteral stones, prediabetes, GERD, hyperlipidemia, hypertension, asthma, anxiety, depression who presents with nausea and vomiting over the last several hours associated with abdominal pain and diarrhea.  The patient states that initially this evening she started to feel constipated.  She then had diffuse abdominal pain and started vomiting.  She has had 2 episodes of vomiting prior to coming to the ED.  She reports abdominal distention.  She denies any fever.  She states that after the pain started she began to have diarrhea.    I reviewed the past medical records.  The patient's most recent outpatient encounter was on 4/18 with family medicine for evaluation of a cough.   Physical Exam   Triage Vital Signs: ED Triage Vitals  Enc Vitals Group     BP 12/26/22 0207 (!) 150/99     Pulse Rate 12/26/22 0204 98     Resp 12/26/22 0204 16     Temp 12/26/22 0204 97.8 F (36.6 C)     Temp Source 12/26/22 0204 Oral     SpO2 12/26/22 0204 98 %     Weight 12/26/22 0204 138 lb 14.2 oz (63 kg)     Height 12/26/22 0204 5\' 3"  (1.6 m)     Head Circumference --      Peak Flow --      Pain Score 12/26/22 0204 10     Pain Loc --      Pain Edu? --      Excl. in GC? --     Most recent vital signs: Vitals:   12/26/22 0207 12/26/22 0330  BP: (!) 150/99 (!) 148/90  Pulse:  85  Resp:  16  Temp:    SpO2:  96%     General: Alert and oriented, uncomfortable appearing but in no acute distress. CV:  Good peripheral perfusion.  Resp:  Normal effort.  Abd:  No distention.  Soft with mild periumbilical tenderness. Other:  No jaundice or scleral icterus.  Somewhat dry mucous membranes.   ED Results / Procedures / Treatments   Labs (all labs ordered are listed, but only abnormal results are displayed) Labs  Reviewed  LIPASE, BLOOD - Abnormal; Notable for the following components:      Result Value   Lipase 56 (*)    All other components within normal limits  COMPREHENSIVE METABOLIC PANEL - Abnormal; Notable for the following components:   Glucose, Bld 167 (*)    BUN 21 (*)    Calcium 8.8 (*)    All other components within normal limits  CBC - Abnormal; Notable for the following components:   WBC 16.2 (*)    RBC 5.29 (*)    Hemoglobin 15.2 (*)    All other components within normal limits  URINALYSIS, ROUTINE W REFLEX MICROSCOPIC - Abnormal; Notable for the following components:   Color, Urine YELLOW (*)    APPearance CLOUDY (*)    Ketones, ur 5 (*)    Leukocytes,Ua TRACE (*)    Bacteria, UA RARE (*)    All other components within normal limits     EKG     RADIOLOGY  CT abdomen/pelvis: I independently viewed and interpreted the images; there are no dilated bowel loops or any free air or free fluid.  Radiology  report indicates the following:  IMPRESSION:  1. Multiple actively inflamed small bowel loops, most pronounced in  the left lower abdomen. Appearance favors Infectious/inflammatory  Enteritis.  Small volume free fluid in the small bowel mesentery. No bowel  obstruction or other complicating features.    2. No other acute or inflammatory process identified in the abdomen  or pelvis.  Diverticulosis of the large bowel without active inflammation.     PROCEDURES:  Critical Care performed: No  Procedures   MEDICATIONS ORDERED IN ED: Medications  ondansetron (ZOFRAN) injection 4 mg (4 mg Intravenous Given 12/26/22 0321)  sodium chloride 0.9 % bolus 1,000 mL (1,000 mLs Intravenous New Bag/Given 12/26/22 0324)  HYDROmorphone (DILAUDID) injection 0.5 mg (0.5 mg Intravenous Given 12/26/22 0325)  iohexol (OMNIPAQUE) 300 MG/ML solution 100 mL (100 mLs Intravenous Contrast Given 12/26/22 0402)     IMPRESSION / MDM / ASSESSMENT AND PLAN / ED COURSE  I reviewed the triage  vital signs and the nursing notes.  52 year old female with PMH as noted above presents with acute onset of nausea and vomiting associated with abdominal pain and some diarrhea.  During my exam the patient vomited several times.  Her vital signs are normal except for hypertension.  Abdomen is soft with mild periumbilical discomfort..    Differential diagnosis includes, but is not limited to, colitis, gastroenteritis, diverticulitis, gastritis, pancreatitis, other hepatobiliary cause, small bowel obstruction, volvulus.  We will obtain lab workup, CT abdomen/pelvis, give fluids, analgesia, and antiemetics and reassess.  Patient's presentation is most consistent with acute presentation with potential threat to life or bodily function.  ----------------------------------------- 6:10 AM on 12/26/2022 -----------------------------------------  Lab workup shows leukocytosis.  LFTs are normal.  Lipase is minimally elevated.  Urinalysis shows significant WBCs but only rare bacteria and no nitrites.  The patient has no dysuria or any clinical evidence of UTI.  Therefore, I do not see any strong indication for treatment at this time.  CT shows enteritis with no other acute findings.  On reassessment the patient is feeling much better.  She is now tolerating p.o.  She would like to home.  She is stable for discharge at this time.  I counseled the patient on the results of the workup and plan of care.  I have prescribed Zofran.  I gave strict return precautions and she expressed understanding.   FINAL CLINICAL IMPRESSION(S) / ED DIAGNOSES   Final diagnoses:  Gastroenteritis     Rx / DC Orders   ED Discharge Orders          Ordered    ondansetron (ZOFRAN-ODT) 4 MG disintegrating tablet  Every 8 hours PRN        12/26/22 0608             Note:  This document was prepared using Dragon voice recognition software and may include unintentional dictation errors.    Dionne Bucy,  MD 12/26/22 [phone]

## 2022-12-26 NOTE — ED Notes (Signed)
Ice water given to patient at this time. Patient declines additional needs.

## 2022-12-26 NOTE — ED Triage Notes (Signed)
Pt to ed from home for abdominal pain since about 6 pm. Pt ambulatory in lobby. Pt is caox4, in no acute distress. Pt has had one episode of vomiting. Pt states she has HX of kidney stones.

## 2022-12-26 NOTE — ED Notes (Addendum)
Pt being verbally abusive to staff. Pt called this RN a Research officer, political party.   This RN and tech attempted blood draw and was unsuccessful.   I advised they would have to go to the lobby as we didn't have a room available at this time. Pt husband stated " You can't make me go to the fucking lobby. This is ridiculous I would like to speak to your supervisor. From the moment we walked in you guys have been nothing but rude to [place]". I told them I would call my charge nurse directly.   When the pt and spouse entered the lobby originally, the spouse was very rude to registration and was slamming stuff down in the lobby, witnessed by multiple other staff members.

## 2022-12-27 ENCOUNTER — Telehealth: Payer: Self-pay

## 2022-12-27 NOTE — Transitions of Care (Post Inpatient/ED Visit) (Signed)
   12/27/2022  Name: Annette Gonzales MRN: [id number] DOB: 12/13/1970  Today's TOC FU Call Status: Today's TOC FU Call Status:: Successful TOC FU Call Competed TOC FU Call Complete Date: 12/27/22  Transition Care Management Follow-up Telephone Call Date of Discharge: 12/26/22 Discharge Facility: [facility]) Type of Discharge: Emergency Department Reason for ED Visit: Other: (Gastroenteritis) How have you been since you were released from the hospital?: Better Any questions or concerns?: No  Items Reviewed: Did you receive and understand the discharge instructions provided?: Yes Medications obtained,verified, and reconciled?: Yes (Medications Reviewed) Any new allergies since your discharge?: No Dietary orders reviewed?: Yes Do you have support at home?: Yes  Medications Reviewed Today: Medications Reviewed Today     Reviewed by Merleen Nicely, LPN (Licensed Practical Nurse) on 12/27/22 at 1337  Med List Status: <None>   Medication Order Taking? Sig Documenting Provider Last Dose Status Informant  albuterol (VENTOLIN HFA) 108 (90 Base) MCG/ACT inhaler [id number] Yes Inhale 1-2 puffs into the lungs every 6 (six) hours as needed for wheezing or shortness of breath. Joycelyn Man M, PA-C Taking Active   fluticasone (FLONASE) 50 MCG/ACT nasal spray [id number] Yes Place 2 sprays into both nostrils daily. Mort Sawyers, FNP Taking Active   hydrOXYzine (VISTARIL) 50 MG capsule [id number] Yes Take 1 capsule (50 mg total) by mouth 3 (three) times daily as needed. Mort Sawyers, FNP Taking Active   lisinopril (ZESTRIL) 10 MG tablet [id number] Yes Take 1 tablet (10 mg total) by mouth daily. Mort Sawyers, FNP Taking Active   montelukast (SINGULAIR) 10 MG tablet [id number] Yes Take 1 tablet (10 mg total) by mouth at bedtime. Mort Sawyers, FNP Taking Active   ondansetron (ZOFRAN-ODT) 4 MG disintegrating tablet [id number] Yes Take 1 tablet (4 mg total) by mouth  every 8 (eight) hours as needed for nausea or vomiting. Dionne Bucy, MD Taking Active   predniSONE (DELTASONE) 20 MG tablet [id number] Yes Take 2 tablets (40 mg total) by mouth daily with breakfast. Margaretann Loveless, PA-C Taking Active   promethazine-dextromethorphan (PROMETHAZINE-DM) 6.25-15 MG/5ML syrup [id number] Yes Take 5 mLs by mouth 4 (four) times daily as needed. Margaretann Loveless, PA-C Taking Active   scopolamine (TRANSDERM-SCOP) 1 MG/3DAYS [id number] Yes Place 1 patch (1.5 mg total) onto the skin every 3 (three) days. Mort Sawyers, FNP Taking Active   sertraline (ZOLOFT) 100 MG tablet [id number] Yes Take two tablets once daily Mort Sawyers, FNP Taking Active             Home Care and Equipment/Supplies: Were Home Health Services Ordered?: NA Any new equipment or medical supplies ordered?: NA  Functional Questionnaire: Do you need assistance with bathing/showering or dressing?: No Do you need assistance with meal preparation?: No Do you need assistance with eating?: No Do you have difficulty maintaining continence: No Do you need assistance with getting out of bed/getting out of a chair/moving?: No Do you have difficulty managing or taking your medications?: No  Follow up appointments reviewed: PCP Follow-up appointment confirmed?: No (pt refused) MD Provider Line Number:[phone] Given: Yes Specialist Hospital Follow-up appointment confirmed?: No Do you understand care options if your condition(s) worsen?: Yes-patient verbalized understanding    SIGNATURE  Woodfin Ganja LPN [facility] Nurse Health Advisor Direct Dial [phone]

## 2022-12-29 DIAGNOSIS — F4310 Post-traumatic stress disorder, unspecified: Secondary | ICD-10-CM | POA: Diagnosis not present

## 2023-01-02 DIAGNOSIS — F4310 Post-traumatic stress disorder, unspecified: Secondary | ICD-10-CM | POA: Diagnosis not present

## 2023-01-03 DIAGNOSIS — F4001 Agoraphobia with panic disorder: Secondary | ICD-10-CM | POA: Diagnosis not present

## 2023-01-03 DIAGNOSIS — F331 Major depressive disorder, recurrent, moderate: Secondary | ICD-10-CM | POA: Diagnosis not present

## 2023-01-16 ENCOUNTER — Telehealth: Payer: Self-pay

## 2023-01-16 ENCOUNTER — Encounter: Payer: Self-pay | Admitting: Family

## 2023-01-16 DIAGNOSIS — Z1211 Encounter for screening for malignant neoplasm of colon: Secondary | ICD-10-CM

## 2023-01-16 NOTE — Addendum Note (Signed)
Addended by: Mort Sawyers on: 01/16/2023 01:43 PM   Modules accepted: Orders

## 2023-01-16 NOTE — Telephone Encounter (Signed)
Gastroenterology Pre-Procedure Review  Request Date: TBD after office visit with Inetta Fermo Requesting Physician: Dr. Jodelle Gross  PATIENT REVIEW QUESTIONS: The patient responded to the following health history questions as indicated:    1. Are you having any GI issues? Abdominal pain 2. Do you have a personal history of Polyps? no 3. Do you have a family history of Colon Cancer or Polyps? yes (grandfather colon cancer) 4. Diabetes Mellitus? no 5. Joint replacements in the past 12 months?no 6. Major health problems in the past 3 months?no 7. Any artificial heart valves, MVP, or defibrillator?no    MEDICATIONS & ALLERGIES:    Patient reports the following regarding taking any anticoagulation/antiplatelet therapy:   Plavix, Coumadin, Eliquis, Xarelto, Lovenox, Pradaxa, Brilinta, or Effient? no Aspirin? no  Patient confirms/reports the following medications:  Current Outpatient Medications  Medication Sig Dispense Refill   albuterol (VENTOLIN HFA) 108 (90 Base) MCG/ACT inhaler Inhale 1-2 puffs into the lungs every 6 (six) hours as needed for wheezing or shortness of breath. 18 g 0   fluticasone (FLONASE) 50 MCG/ACT nasal spray Place 2 sprays into both nostrils daily. 16 g 2   hydrOXYzine (VISTARIL) 50 MG capsule Take 1 capsule (50 mg total) by mouth 3 (three) times daily as needed. 30 capsule 0   lisinopril (ZESTRIL) 10 MG tablet Take 1 tablet (10 mg total) by mouth daily. 90 tablet 2   montelukast (SINGULAIR) 10 MG tablet Take 1 tablet (10 mg total) by mouth at bedtime. 90 tablet 0   ondansetron (ZOFRAN-ODT) 4 MG disintegrating tablet Take 1 tablet (4 mg total) by mouth every 8 (eight) hours as needed for nausea or vomiting. 15 tablet 0   predniSONE (DELTASONE) 20 MG tablet Take 2 tablets (40 mg total) by mouth daily with breakfast. 10 tablet 0   promethazine-dextromethorphan (PROMETHAZINE-DM) 6.25-15 MG/5ML syrup Take 5 mLs by mouth 4 (four) times daily as needed. 118 mL 0   scopolamine  (TRANSDERM-SCOP) 1 MG/3DAYS Place 1 patch (1.5 mg total) onto the skin every 3 (three) days. 4 patch 0   sertraline (ZOLOFT) 100 MG tablet Take two tablets once daily 180 tablet 1   No current facility-administered medications for this visit.    Patient confirms/reports the following allergies:  Allergies  Allergen Reactions   Sulfa Antibiotics Other (See Comments)    Nausea and hot flashes    No orders of the defined types were placed in this encounter.   AUTHORIZATION INFORMATION Primary Insurance: 1D#: Group #:  Secondary Insurance: 1D#: Group #:  SCHEDULE INFORMATION: Date: TBD Time: Location: TBD

## 2023-01-19 DIAGNOSIS — F4310 Post-traumatic stress disorder, unspecified: Secondary | ICD-10-CM | POA: Diagnosis not present

## 2023-01-30 DIAGNOSIS — Z7182 Exercise counseling: Secondary | ICD-10-CM | POA: Diagnosis not present

## 2023-01-30 DIAGNOSIS — Z713 Dietary counseling and surveillance: Secondary | ICD-10-CM | POA: Diagnosis not present

## 2023-02-02 ENCOUNTER — Other Ambulatory Visit: Payer: Self-pay

## 2023-02-02 DIAGNOSIS — F4310 Post-traumatic stress disorder, unspecified: Secondary | ICD-10-CM | POA: Diagnosis not present

## 2023-02-06 NOTE — Progress Notes (Unsigned)
Annette Amy, PA-C [address]  [address]  [place], [place] [address]  Main: [phone]  Fax: [phone]   Gastroenterology Consultation  Referring Provider:     Mort Sawyers, FNP Primary Care Physician:  Mort Sawyers, FNP Primary Gastroenterologist:  Annette Amy, PA-C / Dr. Wyline Mood   Reason for Consultation:     Abdominal pain        HPI:   Annette Gonzales is a 52 y.o. y/o female referred for consultation & management  by Mort Sawyers, FNP.    She went to [facility] 12/26/2022 to evaluate abdominal pain.  Had constipation and vomiting.  After that had diarrhea.  Abdominal pelvic CT showed multiple actively inflamed small bowel loops, favoring infectious versus inflammatory enteritis.  Diverticulosis with no diverticulitis.  Elevated white count 16.2.  Normal hemoglobin 15.2.  Lipase mildly elevated at 56.  Was diagnosed with gastroenteritis.  Last colonoscopy and EGD done by Dr. Mechele Collin in 2013 were reportedly normal.  Results unavailable.  Patient states she has had chronic intermittent GI symptoms for many years.  She has episodes of diarrhea, upper abdominal pain in the epigastrium and LUQ.  Occasional episode of vomiting.  She had recent episode of constipation which resolved after she took MiraLAX.  Currently she is having normal bowel movements.  These GI episodes occur 2 or 3 times per month.  She avoids milk and dairy which caused diarrhea.  She denies rectal bleeding or unintentional weight loss.  H. pylori breath test was negative last year through her PCP.  She is concerned she may have irritable bowel syndrome, yet not officially diagnosed.  She has not had any test for celiac or alpha gal.  She has had a tick bite in the past.  No known food allergies.  She avoids seeds and nuts due to having diverticulitis many years ago.  She is here today with her husband Annette Gonzales.  They have city water.  She recently returned from a cruise.  No other travel.   Past Medical  History:  Diagnosis Date   Asthma    Depression    Diverticulitis    Frequent headaches    GERD (gastroesophageal reflux disease)    History of kidney stones    History of stomach ulcers history of mouth ulcers   Hyperlipidemia    Hypertension    Urinary tract bacterial infections     Past Surgical History:  Procedure Laterality Date   ABDOMINAL HYSTERECTOMY  2005   Uterus only   APPENDECTOMY  1985   HERNIA REPAIR  2012   LEEP     TONSILLECTOMY     TONSILLECTOMY AND ADENOIDECTOMY  1989    Prior to Admission medications   Medication Sig Start Date End Date Taking? Authorizing Provider  albuterol (VENTOLIN HFA) 108 (90 Base) MCG/ACT inhaler Inhale 1-2 puffs into the lungs every 6 (six) hours as needed for wheezing or shortness of breath. 11/10/22   Margaretann Loveless, PA-C  fluticasone (FLONASE) 50 MCG/ACT nasal spray Place 2 sprays into both nostrils daily. 08/05/22   Mort Sawyers, FNP  hydrOXYzine (VISTARIL) 50 MG capsule Take 1 capsule (50 mg total) by mouth 3 (three) times daily as needed. 08/15/22   Mort Sawyers, FNP  lisinopril (ZESTRIL) 10 MG tablet Take 1 tablet (10 mg total) by mouth daily. 12/21/22   Mort Sawyers, FNP  LORazepam (ATIVAN) 0.5 MG tablet Take 0.5 mg by mouth daily as needed. 01/10/23   [provider]  mirtazapine (  REMERON) 15 MG tablet Take 15 mg by mouth at bedtime. 01/08/23   [provider]  montelukast (SINGULAIR) 10 MG tablet Take 1 tablet (10 mg total) by mouth at bedtime. 12/21/22   Mort Sawyers, FNP  ondansetron (ZOFRAN-ODT) 4 MG disintegrating tablet Take 1 tablet (4 mg total) by mouth every 8 (eight) hours as needed for nausea or vomiting. 12/26/22   Dionne Bucy, MD  predniSONE (DELTASONE) 20 MG tablet Take 2 tablets (40 mg total) by mouth daily with breakfast. 11/10/22   Margaretann Loveless, PA-C  promethazine-dextromethorphan (PROMETHAZINE-DM) 6.25-15 MG/5ML syrup Take 5 mLs by mouth 4 (four) times daily as needed.  11/10/22   Margaretann Loveless, PA-C  scopolamine (TRANSDERM-SCOP) 1 MG/3DAYS Place 1 patch (1.5 mg total) onto the skin every 3 (three) days. 09/09/22   Mort Sawyers, FNP  sertraline (ZOLOFT) 100 MG tablet Take two tablets once daily 11/18/22   Mort Sawyers, FNP    Family History  Problem Relation Age of Onset   Arthritis Mother    Cancer Mother        Breast Cancer   Heart disease Mother        Mitral valve disease   Hyperlipidemia Father    Heart disease Father    Stroke Father    Hypertension Father    Arthritis Maternal Grandmother    Mental illness Maternal Grandmother    Cancer Maternal Grandfather        Colon/ Prostate Cancer   Heart disease Maternal Grandfather    Stroke Maternal Grandfather    Diabetes Maternal Grandfather    Arthritis Paternal Grandmother    Hypertension Paternal Grandmother        renal artery stenosis   Heart attack Paternal Grandfather    Hypertension Sister    Heart disease Sister        tachycardia s/p ablation     Social History   Tobacco Use   Smoking status: Never   Smokeless tobacco: Never  Vaping Use   Vaping status: Never Used  Substance Use Topics   Alcohol use: Yes    Comment: occasionally   Drug use: No    Allergies as of 02/07/2023 - Review Complete 01/16/2023  Allergen Reaction Noted   Sulfa antibiotics Other (See Comments) 04/21/2016    Review of Systems:    All systems reviewed and negative except where noted in HPI.   Physical Exam:  There were no vitals taken for this visit. No LMP recorded. Patient has had a hysterectomy. Psych:  Alert and cooperative. Normal mood and affect. General:   Alert,  Well-developed, well-nourished, pleasant and cooperative in NAD Head:  Normocephalic and atraumatic. Eyes:  Sclera clear, no icterus.   Conjunctiva pink. Neck:  Supple; no masses or thyromegaly. Lungs:  Respirations even and unlabored.  Clear throughout to auscultation.   No wheezes, crackles, or rhonchi. No acute  distress. Heart:  Regular rate and rhythm; no murmurs, clicks, rubs, or gallops. Abdomen:  Normal bowel sounds.  No bruits.  Soft, and non-distended without masses, hepatosplenomegaly or hernias noted.  Moderate Epigastric Tenderness.  Mild bilateral lower abdominal tenderness.  No guarding or rebound tenderness.    Neurologic:  Alert and oriented x3;  grossly normal neurologically. Psych:  Alert and cooperative. Normal mood and affect.  Imaging Studies: No results found.  Assessment and Plan:   Annette Gonzales is a 52 y.o. y/o female has been referred for follow-up of acute abdominal pain with gastroenteritis which was evaluated at  [facility] ED 12/26/2022.  Lipase was mildly elevated.  Inflammation in the small intestine on CT scan.  Differential included infectious versus inflammatory enteritis.  She has had chronic intermittent GI symptoms for many years.  Differential also includes irritable bowel syndrome, celiac, and alpha gal.  Ordering lab work, EGD, colonoscopy for further evaluation.   1.  Enteritis   Appears to be currently resolved.  We discussed test results from the ED.  2.  Abnormal CT abdomen: small intestine inflammation; Overdue for 10-year repeat screening colonoscopy.  Scheduling Colonoscopy I discussed risks of EGD and colonoscopy with patient to include risk of bleeding, perforation, and risk of sedation.  Patient expressed understanding and agrees to proceed with procedures.    3.  Upper Abdominal pain - Epigastric and LUQ  Lab: CBC, CMP  Scheduling EGD I discussed risks of EGD with patient to include risk of bleeding, perforation, and risk of sedation.  Patient expressed understanding and agrees to proceed with EGD.   4.  Elevated lipase  Lab: Lipase  Avoid alcohol.  Recent CT showed no evidence of pancreatitis.  5.  Leukocytosis  Lab: CBC  6.  Chronic intermittent diarrhea - Episodic; Currently resolved.  Lab: Celiac, Alpha Gal    Follow up 1 month after EGD  and colonoscopy procedures with TG.  Annette Amy, PA-C

## 2023-02-07 ENCOUNTER — Encounter: Payer: Self-pay | Admitting: Physician Assistant

## 2023-02-07 ENCOUNTER — Ambulatory Visit: Payer: BC Managed Care – PPO | Admitting: Physician Assistant

## 2023-02-07 VITALS — BP 130/86 | HR 102 | Temp 98.4°F | Ht 62.0 in | Wt 146.8 lb

## 2023-02-07 DIAGNOSIS — D72829 Elevated white blood cell count, unspecified: Secondary | ICD-10-CM

## 2023-02-07 DIAGNOSIS — R1013 Epigastric pain: Secondary | ICD-10-CM

## 2023-02-07 DIAGNOSIS — R748 Abnormal levels of other serum enzymes: Secondary | ICD-10-CM

## 2023-02-07 DIAGNOSIS — Z1211 Encounter for screening for malignant neoplasm of colon: Secondary | ICD-10-CM

## 2023-02-07 DIAGNOSIS — K529 Noninfective gastroenteritis and colitis, unspecified: Secondary | ICD-10-CM

## 2023-02-07 DIAGNOSIS — R197 Diarrhea, unspecified: Secondary | ICD-10-CM

## 2023-02-07 MED ORDER — PEG 3350-KCL-NA BICARB-NACL 420 G PO SOLR: 4000.0000 mL | Freq: Once | ORAL | 0 refills | Status: AC

## 2023-02-09 DIAGNOSIS — F4310 Post-traumatic stress disorder, unspecified: Secondary | ICD-10-CM | POA: Diagnosis not present

## 2023-02-10 ENCOUNTER — Telehealth: Payer: Self-pay

## 2023-02-10 LAB — COMPREHENSIVE METABOLIC PANEL
ALT: 22 IU/L (ref 0–32)
AST: 26 IU/L (ref 0–40)
Albumin: 4.8 g/dL (ref 3.8–4.9)
Alkaline Phosphatase: 108 IU/L (ref 44–121)
BUN/Creatinine Ratio: 21 (ref 9–23)
BUN: 15 mg/dL (ref 6–24)
Bilirubin Total: <0.2 mg/dL (ref 0.0–1.2)
CO2: 20 mmol/L (ref 20–29)
Calcium: 9.5 mg/dL (ref 8.7–10.2)
Chloride: 103 mmol/L (ref 96–106)
Creatinine, Ser: 0.72 mg/dL (ref 0.57–1.00)
Globulin, Total: 2.8 g/dL (ref 1.5–4.5)
Glucose: 156 mg/dL — ABNORMAL HIGH (ref 70–99)
Potassium: 4.4 mmol/L (ref 3.5–5.2)
Sodium: 142 mmol/L (ref 134–144)
Total Protein: 7.6 g/dL (ref 6.0–8.5)
eGFR: 101 mL/min/{1.73_m2} (ref >59)

## 2023-02-10 LAB — ALPHA-GAL PANEL
Allergen Lamb IgE: <0.1 kU/L
Beef IgE: <0.1 kU/L
IgE (Immunoglobulin E), Serum: 205 IU/mL (ref 6–495)
O215-IgE Alpha-Gal: <0.1 kU/L
Pork IgE: <0.1 kU/L

## 2023-02-10 LAB — CBC WITH DIFFERENTIAL
Basophils Absolute: 0.1 10*3/uL (ref 0.0–0.2)
Basos: 1 %
EOS (ABSOLUTE): 0.1 10*3/uL (ref 0.0–0.4)
Eos: 3 %
Hematocrit: 43.1 % (ref 34.0–46.6)
Hemoglobin: 14.5 g/dL (ref 11.1–15.9)
Immature Grans (Abs): 0 10*3/uL (ref 0.0–0.1)
Immature Granulocytes: 0 %
Lymphocytes Absolute: 1.3 10*3/uL (ref 0.7–3.1)
Lymphs: 24 %
MCH: 28.8 pg (ref 26.6–33.0)
MCHC: 33.6 g/dL (ref 31.5–35.7)
MCV: 86 fL (ref 79–97)
Monocytes Absolute: 0.5 10*3/uL (ref 0.1–0.9)
Monocytes: 10 %
Neutrophils Absolute: 3.2 10*3/uL (ref 1.4–7.0)
Neutrophils: 62 %
RBC: 5.04 x10E6/uL (ref 3.77–5.28)
RDW: 13.6 % (ref 11.7–15.4)
WBC: 5.2 10*3/uL (ref 3.4–10.8)

## 2023-02-10 LAB — CELIAC DISEASE AB SCREEN W/RFX
Antigliadin Abs, IgA: 3 units (ref 0–19)
IgA/Immunoglobulin A, Serum: 121 mg/dL (ref 87–352)
Transglutaminase IgA: <2 U/mL (ref 0–3)

## 2023-02-10 LAB — LIPASE: Lipase: 57 U/L (ref 14–72)

## 2023-02-10 LAB — C-REACTIVE PROTEIN: CRP: 2 mg/L (ref 0–10)

## 2023-02-10 NOTE — Progress Notes (Signed)
Notify pt. 1. Negative Celiac lab. 2. Negative Alpha Gal test. 3.  Normal CBC and CBC except elevated glucose. F/U with PCP for glucose.   4. Lipase pancreas enzyme has returned to normal. 5.  CRP is normal.  No evidence of inflammation. 6. C/W plan for EGD / Colon as scheduled.

## 2023-02-10 NOTE — Telephone Encounter (Signed)
Called and left a message for call back  

## 2023-02-10 NOTE — Telephone Encounter (Signed)
Patient verbalized understanding of results  

## 2023-02-10 NOTE — Telephone Encounter (Signed)
Pt left message returning call ?

## 2023-02-10 NOTE — Telephone Encounter (Signed)
-----   Message from Celso Amy sent at 02/10/2023  9:27 AM EDT ----- Notify pt. 1. Negative Celiac lab. 2. Negative Alpha Gal test. 3.  Normal CBC and CBC except elevated glucose. F/U with PCP for glucose.   4. Lipase pancreas enzyme has returned to normal. 5.  CRP is normal.  No evidence of inflammation. 6. C/W plan for EGD / Colon as scheduled.

## 2023-02-16 DIAGNOSIS — F4310 Post-traumatic stress disorder, unspecified: Secondary | ICD-10-CM | POA: Diagnosis not present

## 2023-02-20 ENCOUNTER — Encounter: Payer: Self-pay | Admitting: Family

## 2023-02-20 NOTE — Telephone Encounter (Signed)
Patient needs office visit. Recommend first available appointment with any provider, but if she refuses then Wednesday is fine but needs ED precautions.

## 2023-02-20 NOTE — Telephone Encounter (Signed)
Ok thanks. Agree with ER precautions ok for Wednesday as pt has refused another provider, as long as pt remains asymptomatic. Mack Hook for your review.

## 2023-02-20 NOTE — Telephone Encounter (Signed)
Called patient states she received notification last week but she thought it was a fluke. She states that her heart feels "funny" at times and feels weak at times. She was seen by Cardiology but has been 4 years or more. She is currently not having any symptoms. She didn't remember any abnormal symptoms and was more active last week. Reviewed all red words with patient and need to be seen in ED if any. She agreed. Didn't want to see another provider. Have scheduled with Tabitha at first available on Wednesday.  Sending message to PCP as well as Mayra Reel for review before pcp return.

## 2023-02-22 ENCOUNTER — Ambulatory Visit: Payer: BC Managed Care – PPO | Admitting: Family

## 2023-02-23 ENCOUNTER — Ambulatory Visit: Payer: BC Managed Care – PPO | Admitting: Physician Assistant

## 2023-02-23 DIAGNOSIS — F4310 Post-traumatic stress disorder, unspecified: Secondary | ICD-10-CM | POA: Diagnosis not present

## 2023-02-27 DIAGNOSIS — D2361 Other benign neoplasm of skin of right upper limb, including shoulder: Secondary | ICD-10-CM | POA: Diagnosis not present

## 2023-02-27 DIAGNOSIS — D492 Neoplasm of unspecified behavior of bone, soft tissue, and skin: Secondary | ICD-10-CM | POA: Diagnosis not present

## 2023-02-27 DIAGNOSIS — L821 Other seborrheic keratosis: Secondary | ICD-10-CM | POA: Diagnosis not present

## 2023-03-09 DIAGNOSIS — F4310 Post-traumatic stress disorder, unspecified: Secondary | ICD-10-CM | POA: Diagnosis not present

## 2023-03-16 DIAGNOSIS — F4310 Post-traumatic stress disorder, unspecified: Secondary | ICD-10-CM | POA: Diagnosis not present

## 2023-03-23 DIAGNOSIS — F4310 Post-traumatic stress disorder, unspecified: Secondary | ICD-10-CM | POA: Diagnosis not present

## 2023-03-30 ENCOUNTER — Other Ambulatory Visit: Payer: Self-pay | Admitting: Oncology

## 2023-03-30 DIAGNOSIS — F329 Major depressive disorder, single episode, unspecified: Secondary | ICD-10-CM | POA: Diagnosis not present

## 2023-03-30 DIAGNOSIS — F411 Generalized anxiety disorder: Secondary | ICD-10-CM | POA: Diagnosis not present

## 2023-03-30 DIAGNOSIS — F4310 Post-traumatic stress disorder, unspecified: Secondary | ICD-10-CM | POA: Diagnosis not present

## 2023-03-30 DIAGNOSIS — F410 Panic disorder [episodic paroxysmal anxiety] without agoraphobia: Secondary | ICD-10-CM | POA: Diagnosis not present

## 2023-03-30 DIAGNOSIS — Z006 Encounter for examination for normal comparison and control in clinical research program: Secondary | ICD-10-CM

## 2023-03-31 ENCOUNTER — Ambulatory Visit: Payer: BC Managed Care – PPO | Admitting: Anesthesiology

## 2023-03-31 ENCOUNTER — Ambulatory Visit
Admission: RE | Admit: 2023-03-31 | Discharge: 2023-03-31 | Disposition: A | Discharge status: Home / Self Care | Payer: BC Managed Care – PPO | Attending: Gastroenterology | Admitting: Gastroenterology

## 2023-03-31 ENCOUNTER — Encounter
Admission: RE | Disposition: A | Discharge status: Home / Self Care | Payer: Self-pay | Source: Home / Self Care | Attending: Gastroenterology

## 2023-03-31 DIAGNOSIS — K5730 Diverticulosis of large intestine without perforation or abscess without bleeding: Secondary | ICD-10-CM | POA: Diagnosis not present

## 2023-03-31 DIAGNOSIS — K648 Other hemorrhoids: Secondary | ICD-10-CM | POA: Diagnosis not present

## 2023-03-31 DIAGNOSIS — Z79899 Other long term (current) drug therapy: Secondary | ICD-10-CM | POA: Insufficient documentation

## 2023-03-31 DIAGNOSIS — R1013 Epigastric pain: Secondary | ICD-10-CM

## 2023-03-31 DIAGNOSIS — Z1211 Encounter for screening for malignant neoplasm of colon: Secondary | ICD-10-CM

## 2023-03-31 DIAGNOSIS — I10 Essential (primary) hypertension: Secondary | ICD-10-CM | POA: Diagnosis not present

## 2023-03-31 DIAGNOSIS — K5790 Diverticulosis of intestine, part unspecified, without perforation or abscess without bleeding: Secondary | ICD-10-CM | POA: Diagnosis not present

## 2023-03-31 DIAGNOSIS — J45909 Unspecified asthma, uncomplicated: Secondary | ICD-10-CM | POA: Diagnosis not present

## 2023-03-31 DIAGNOSIS — K2970 Gastritis, unspecified, without bleeding: Secondary | ICD-10-CM | POA: Insufficient documentation

## 2023-03-31 HISTORY — PX: COLONOSCOPY WITH PROPOFOL: SHX5780

## 2023-03-31 HISTORY — PX: BIOPSY: SHX5522

## 2023-03-31 HISTORY — PX: ESOPHAGOGASTRODUODENOSCOPY (EGD) WITH PROPOFOL: SHX5813

## 2023-03-31 SURGERY — COLONOSCOPY WITH PROPOFOL
Anesthesia: General

## 2023-03-31 MED ORDER — PROPOFOL 10 MG/ML IV BOLUS
INTRAVENOUS | Status: DC | PRN
  Administered 2023-03-31: 100 mg via INTRAVENOUS
  Administered 2023-03-31: 100 ug/kg/min via INTRAVENOUS

## 2023-03-31 MED ORDER — LIDOCAINE HCL (CARDIAC) PF 100 MG/5ML IV SOSY
PREFILLED_SYRINGE | INTRAVENOUS | Status: DC | PRN
  Administered 2023-03-31: 100 mg via INTRAVENOUS

## 2023-03-31 MED ORDER — SODIUM CHLORIDE 0.9 % IV SOLN: INTRAVENOUS | Status: DC

## 2023-03-31 NOTE — Anesthesia Postprocedure Evaluation (Signed)
Anesthesia Post Note  Patient: Annette Gonzales  Procedure(s) Performed: COLONOSCOPY WITH PROPOFOL ESOPHAGOGASTRODUODENOSCOPY (EGD) WITH PROPOFOL BIOPSY  Patient location during evaluation: Endoscopy Anesthesia Type: General Level of consciousness: awake and alert Pain management: pain level controlled Vital Signs Assessment: post-procedure vital signs reviewed and stable Respiratory status: spontaneous breathing, nonlabored ventilation, respiratory function stable and patient connected to nasal cannula oxygen Cardiovascular status: blood pressure returned to baseline and stable Postop Assessment: no apparent nausea or vomiting Anesthetic complications: no   No notable events documented.   Last Vitals:  Vitals:   03/31/23 0913 03/31/23 0922  BP: 101/70 102/70  Pulse: 84 75  Resp: 20 20  Temp:    SpO2: 98% 99%    Last Pain:  Vitals:   03/31/23 0922  TempSrc:   PainSc: 0-No pain                 Corinda Gubler

## 2023-03-31 NOTE — Anesthesia Procedure Notes (Signed)
Procedure Name: General with mask airway Date/Time: 03/31/2023 8:39 AM  Performed by: Lily Lovings, CRNAPre-anesthesia Checklist: Patient identified, Emergency Drugs available, Suction available, Patient being monitored and Timeout performed Patient Re-evaluated:Patient Re-evaluated prior to induction Oxygen Delivery Method: Simple face mask Preoxygenation: Pre-oxygenation with 100% oxygen Induction Type: IV induction

## 2023-03-31 NOTE — Op Note (Signed)
[facility] Gastroenterology Patient Name: Annette Gonzales Procedure Date: 03/31/2023 8:30 AM MRN: [id number] Account #: [id number] Date of Birth: January 12, 1971 Admit Type: Outpatient Age: 52 Room: [facility] ENDO ROOM 4 Gender: Female Note Status: Finalized Instrument Name: Nelda Marseille [id number] Procedure:             Colonoscopy Indications:           Screening for colorectal malignant neoplasm Providers:             Wyline Mood MD, MD Referring MD:          Wyline Mood MD, MD (Referring MD) Medicines:             Monitored Anesthesia Care Complications:         No immediate complications. Procedure:             Pre-Anesthesia Assessment:                        - Prior to the procedure, a History and Physical was                         performed, and patient medications, allergies and                         sensitivities were reviewed. The patient's tolerance                         of previous anesthesia was reviewed.                        - The risks and benefits of the procedure and the                         sedation options and risks were discussed with the                         patient. All questions were answered and informed                         consent was obtained.                        - ASA Grade Assessment: II - A patient with mild                         systemic disease.                        After obtaining informed consent, the colonoscope was                         passed under direct vision. Throughout the procedure,                         the patient's blood pressure, pulse, and oxygen                         saturations were monitored continuously. The                         Colonoscope was  introduced through the anus and                         advanced to the the cecum, identified by the                         appendiceal orifice. The colonoscopy was performed                         with ease. The patient tolerated the procedure well.                          The quality of the bowel preparation was excellent.                         The ileocecal valve, appendiceal orifice, and rectum                         were photographed. Findings:      The perianal and digital rectal examinations were normal.      Multiple medium-mouthed diverticula were found in the entire colon.      The exam was otherwise without abnormality on direct and retroflexion       views. Impression:            - Diverticulosis in the entire examined colon.                        - The examination was otherwise normal on direct and                         retroflexion views.                        - No specimens collected. Recommendation:        - Discharge patient to home (with escort).                        - Resume previous diet.                        - Continue present medications.                        - Repeat colonoscopy in 10 years for screening                         purposes. Procedure Code(s):     --- Professional ---                        [phone], Colonoscopy, flexible; diagnostic, including                         collection of specimen(s) by brushing or washing, when                         performed (separate procedure) Diagnosis Code(s):     --- Professional ---                        Z12.11, Encounter for screening  for malignant neoplasm                         of colon                        K57.30, Diverticulosis of large intestine without                         perforation or abscess without bleeding CPT copyright 2022 American Medical Association. All rights reserved. The codes documented in this report are preliminary and upon coder review may  be revised to meet current compliance requirements. Wyline Mood, MD Wyline Mood MD, MD 03/31/2023 9:01:23 AM This report has been signed electronically. Number of Addenda: 0 Note Initiated On: 03/31/2023 8:30 AM Scope Withdrawal Time: 0 hours 9 minutes 55 seconds  Total Procedure  Duration: 0 hours 12 minutes 8 seconds  Estimated Blood Loss:  Estimated blood loss: none.      [facility]

## 2023-03-31 NOTE — Anesthesia Preprocedure Evaluation (Signed)
Anesthesia Evaluation  Patient identified by MRN, date of birth, ID band Patient awake    Reviewed: Allergy & Precautions, NPO status , Patient's Chart, lab work & pertinent test results  History of Anesthesia Complications Negative for: history of anesthetic complications  Airway Mallampati: II  TM Distance: >3 FB Neck ROM: Full    Dental no notable dental hx. (+) Teeth Intact   Pulmonary asthma , neg sleep apnea, neg COPD, Patient abstained from smoking.Not current smoker   Pulmonary exam normal breath sounds clear to auscultation       Cardiovascular Exercise Tolerance: Good METShypertension, Pt. on medications (-) CAD and (-) Past MI (-) dysrhythmias  Rhythm:Regular Rate:Normal - Systolic murmurs    Neuro/Psych  Headaches PSYCHIATRIC DISORDERS Anxiety Depression       GI/Hepatic ,GERD  ,,(+)     (-) substance abuse    Endo/Other  neg diabetes    Renal/GU negative Renal ROS     Musculoskeletal   Abdominal   Peds  Hematology   Anesthesia Other Findings Past Medical History: No date: Asthma No date: Depression No date: Diverticulitis No date: Frequent headaches No date: GERD (gastroesophageal reflux disease) No date: History of kidney stones history of mouth ulcers: History of stomach ulcers No date: Hyperlipidemia No date: Hypertension No date: Urinary tract bacterial infections  Reproductive/Obstetrics                             Anesthesia Physical Anesthesia Plan  ASA: 2  Anesthesia Plan: General   Post-op Pain Management: Minimal or no pain anticipated   Induction: Intravenous  PONV Risk Score and Plan: 3 and Propofol infusion, TIVA and Ondansetron  Airway Management Planned: Nasal Cannula  Additional Equipment: None  Intra-op Plan:   Post-operative Plan:   Informed Consent: I have reviewed the patients History and Physical, chart, labs and discussed the  procedure including the risks, benefits and alternatives for the proposed anesthesia with the patient or authorized representative who has indicated his/her understanding and acceptance.     Dental advisory given  Plan Discussed with: CRNA and Surgeon  Anesthesia Plan Comments: (Discussed risks of anesthesia with patient, including possibility of difficulty with spontaneous ventilation under anesthesia necessitating airway intervention, PONV, and rare risks such as cardiac or respiratory or neurological events, and allergic reactions. Discussed the role of CRNA in patient's perioperative care. Patient understands.)       Anesthesia Quick Evaluation

## 2023-03-31 NOTE — Op Note (Signed)
[facility] Gastroenterology Patient Name: Annette Gonzales Procedure Date: 03/31/2023 8:30 AM MRN: [id number] Account #: [id number] Date of Birth: 11-Jan-1971 Admit Type: Outpatient Age: 52 Room: [facility] ENDO ROOM 4 Gender: Female Note Status: Finalized Instrument Name: Upper Endoscope [id number] Procedure:             Upper GI endoscopy Indications:           Dyspepsia Providers:             Wyline Mood MD, MD Referring MD:          Wyline Mood MD, MD (Referring MD) Medicines:             Monitored Anesthesia Care Complications:         No immediate complications. Procedure:             Pre-Anesthesia Assessment:                        - Prior to the procedure, a History and Physical was                         performed, and patient medications, allergies and                         sensitivities were reviewed. The patient's tolerance                         of previous anesthesia was reviewed.                        - The risks and benefits of the procedure and the                         sedation options and risks were discussed with the                         patient. All questions were answered and informed                         consent was obtained.                        - ASA Grade Assessment: II - A patient with mild                         systemic disease.                        After obtaining informed consent, the endoscope was                         passed under direct vision. Throughout the procedure,                         the patient's blood pressure, pulse, and oxygen                         saturations were monitored continuously. The Endoscope                         was introduced  through the mouth, and advanced to the                         third part of duodenum. The upper GI endoscopy was                         accomplished with ease. The patient tolerated the                         procedure well. Findings:      The esophagus was normal.       The examined duodenum was normal.      The entire examined stomach was normal. Biopsies were taken with a cold       forceps for histology. Impression:            - Normal esophagus.                        - Normal examined duodenum.                        - Normal stomach. Biopsied. Recommendation:        - Await pathology results.                        - Perform a colonoscopy today. Procedure Code(s):     --- Professional ---                        [phone], Esophagogastroduodenoscopy, flexible,                         transoral; with biopsy, single or multiple Diagnosis Code(s):     --- Professional ---                        R10.13, Epigastric pain CPT copyright 2022 American Medical Association. All rights reserved. The codes documented in this report are preliminary and upon coder review may  be revised to meet current compliance requirements. Wyline Mood, MD Wyline Mood MD, MD 03/31/2023 8:46:10 AM This report has been signed electronically. Number of Addenda: 0 Note Initiated On: 03/31/2023 8:30 AM Estimated Blood Loss:  Estimated blood loss: none.      [facility]

## 2023-03-31 NOTE — Transfer of Care (Signed)
Immediate Anesthesia Transfer of Care Note  Patient: Annette Gonzales  Procedure(s) Performed: COLONOSCOPY WITH PROPOFOL ESOPHAGOGASTRODUODENOSCOPY (EGD) WITH PROPOFOL BIOPSY  Patient Location: PACU  Anesthesia Type:MAC  Level of Consciousness: drowsy  Airway & Oxygen Therapy: Patient Spontanous Breathing  Post-op Assessment: Report given to RN and Post -op Vital signs reviewed and stable  Post vital signs: Reviewed and stable  Last Vitals:  Vitals Value Taken Time  BP 85/56 03/31/23 0904  Temp    Pulse 72 03/31/23 0904  Resp 17 03/31/23 0904  SpO2 97 % 03/31/23 0904    Last Pain:  Vitals:   03/31/23 0904  TempSrc:   PainSc: Asleep         Complications: No notable events documented.

## 2023-03-31 NOTE — H&P (Signed)
Wyline Mood, MD [address], [place], [place], [address] [address], [address], [place], [place], [address] Phone: [phone]  Fax: [phone]  Primary Care Physician:  Mort Sawyers, FNP   Pre-Procedure History & Physical: HPI:  Annette Gonzales is a 52 y.o. female is here for an endoscopy and colonoscopy    Past Medical History:  Diagnosis Date   Asthma    Depression    Diverticulitis    Frequent headaches    GERD (gastroesophageal reflux disease)    History of kidney stones    History of stomach ulcers history of mouth ulcers   Hyperlipidemia    Hypertension    Urinary tract bacterial infections     Past Surgical History:  Procedure Laterality Date   ABDOMINAL HYSTERECTOMY  2005   Uterus only   APPENDECTOMY  1985   HERNIA REPAIR  2012   LEEP     TONSILLECTOMY     TONSILLECTOMY AND ADENOIDECTOMY  1989    Prior to Admission medications   Medication Sig Start Date End Date Taking? Authorizing Provider  albuterol (VENTOLIN HFA) 108 (90 Base) MCG/ACT inhaler Inhale 1-2 puffs into the lungs every 6 (six) hours as needed for wheezing or shortness of breath. 11/10/22   Margaretann Loveless, PA-C  fluticasone (FLONASE) 50 MCG/ACT nasal spray Place 2 sprays into both nostrils daily. 08/05/22   Mort Sawyers, FNP  lisinopril (ZESTRIL) 10 MG tablet Take 1 tablet (10 mg total) by mouth daily. 12/21/22   Mort Sawyers, FNP  LORazepam (ATIVAN) 0.5 MG tablet Take 0.5 mg by mouth daily as needed. 01/10/23   [provider]  montelukast (SINGULAIR) 10 MG tablet Take 1 tablet (10 mg total) by mouth at bedtime. 12/21/22   Mort Sawyers, FNP  ondansetron (ZOFRAN-ODT) 4 MG disintegrating tablet Take 1 tablet (4 mg total) by mouth every 8 (eight) hours as needed for nausea or vomiting. 12/26/22   Dionne Bucy, MD  sertraline (ZOLOFT) 100 MG tablet Take two tablets once daily 11/18/22   Mort Sawyers, FNP    Allergies as of 02/07/2023 - Review Complete  02/07/2023  Allergen Reaction Noted   Sulfa antibiotics Other (See Comments) 04/21/2016    Family History  Problem Relation Age of Onset   Arthritis Mother    Cancer Mother        Breast Cancer   Heart disease Mother        Mitral valve disease   Hyperlipidemia Father    Heart disease Father    Stroke Father    Hypertension Father    Arthritis Maternal Grandmother    Mental illness Maternal Grandmother    Cancer Maternal Grandfather        Colon/ Prostate Cancer   Heart disease Maternal Grandfather    Stroke Maternal Grandfather    Diabetes Maternal Grandfather    Arthritis Paternal Grandmother    Hypertension Paternal Grandmother        renal artery stenosis   Heart attack Paternal Grandfather    Hypertension Sister    Heart disease Sister        tachycardia s/p ablation    Social History   Socioeconomic History   Marital status: Married    Spouse name: Not on file   Number of children: Not on file   Years of education: Not on file   Highest education level: Not on file  Occupational History   Occupation: unemployed    Comment: previous patient care coordinator  Tobacco Use   Smoking status: Never   Smokeless tobacco: Never  Vaping Use   Vaping status: Never Used  Substance and Sexual Activity   Alcohol use: Yes    Comment: occasionally   Drug use: No   Sexual activity: Yes    Partners: Male  Other Topics Concern   Not on file  Social History Narrative   Married   unemployed   Some College    2 children   Social Determinants of Health   Financial Resource Strain: Not on file  Food Insecurity: Not on file  Transportation Needs: Not on file  Physical Activity: Not on file  Stress: Not on file  Social Connections: Not on file  Intimate Partner Violence: Not on file    Review of Systems: See HPI, otherwise negative ROS  Physical Exam: There were no vitals taken for this visit. General:   Alert,  pleasant and cooperative in NAD Head:   Normocephalic and atraumatic. Neck:  Supple; no masses or thyromegaly. Lungs:  Clear throughout to auscultation, normal respiratory effort.    Heart:  +S1, +S2, Regular rate and rhythm, No edema. Abdomen:  Soft, nontender and nondistended. Normal bowel sounds, without guarding, and without rebound.   Neurologic:  Alert and  oriented x4;  grossly normal neurologically.  Impression/Plan: Annette Gonzales is here for an endoscopy and colonoscopy  to be performed for  evaluation of abdominal pain and colon cancer screening     Risks, benefits, limitations, and alternatives regarding endoscopy have been reviewed with the patient.  Questions have been answered.  All parties agreeable.   Wyline Mood, MD  03/31/2023, 8:04 AM

## 2023-04-02 ENCOUNTER — Other Ambulatory Visit: Payer: Self-pay | Admitting: Family

## 2023-04-02 DIAGNOSIS — J309 Allergic rhinitis, unspecified: Secondary | ICD-10-CM

## 2023-04-03 ENCOUNTER — Encounter: Payer: Self-pay | Admitting: Gastroenterology

## 2023-04-03 ENCOUNTER — Other Ambulatory Visit: Payer: Self-pay | Admitting: Family

## 2023-04-03 ENCOUNTER — Telehealth: Payer: BC Managed Care – PPO | Admitting: Nurse Practitioner

## 2023-04-03 DIAGNOSIS — H109 Unspecified conjunctivitis: Secondary | ICD-10-CM

## 2023-04-03 DIAGNOSIS — J309 Allergic rhinitis, unspecified: Secondary | ICD-10-CM

## 2023-04-03 MED ORDER — OFLOXACIN 0.3 % OP SOLN
1.0000 [drp] | Freq: Four times a day (QID) | OPHTHALMIC | 0 refills | Status: AC
Start: 2023-04-03 — End: 2023-04-08

## 2023-04-03 MED ORDER — MONTELUKAST SODIUM 10 MG PO TABS
10.0000 mg | ORAL_TABLET | Freq: Every day | ORAL | 0 refills | Status: DC
Start: 2023-04-03 — End: 2023-06-29

## 2023-04-03 NOTE — Group Note (Deleted)

## 2023-04-03 NOTE — Progress Notes (Signed)
noted 

## 2023-04-03 NOTE — Progress Notes (Signed)
E-Visit for [employer]   We are sorry that you are not feeling well.  Here is how we plan to help!  Based on what you have shared with me it looks like you have conjunctivitis.  Conjunctivitis is a common inflammatory or infectious condition of the eye that is often referred to as "pink eye".  In most cases it is contagious (viral or bacterial). However, not all conjunctivitis requires antibiotics (ex. Allergic).  We have made appropriate suggestions for you based upon your presentation.  You may continue to rinse your eye out between uses of the antibiotic drops, you may also find artificial tears over the counter helpful as well   I have prescribed Oflaxacin 1-2 drops 4 times a day times 5 days   If symptoms persist or with any changes in vision or worsening pain please seek follow up in person   Avoid unnecessary touching of the eye.  If you wear contact lenses, you will need to refrain from wearing them until you see no white discharge from the eye for at least 24 hours after being on medication.  You should see symptom improvement in 1-2 days after starting the medication regimen.  Call us if symptoms are not improved in 1-2 days.  Home Care: Wash your hands often! Do not wear your contacts until you complete your treatment plan. Avoid sharing towels, bed linen, personal items with a person who has pink eye. See attention for anyone in your home with similar symptoms.  Get Help Right Away If: Your symptoms do not improve. You develop blurred or loss of vision. Your symptoms worsen (increased discharge, pain or redness)   Thank you for choosing an e-visit.  Your e-visit answers were reviewed by a board certified advanced clinical practitioner to complete your personal care plan. Depending upon the condition, your plan could have included both over the counter or prescription medications.  Please review your pharmacy choice. Make sure the pharmacy is open so you can pick up  prescription now. If there is a problem, you may contact your provider through [employer] and have the prescription routed to another pharmacy.  Your safety is important to [place]. If you have drug allergies check your prescription carefully.   For the next 24 hours you can use MyChart to ask questions about today's visit, request a non-urgent call back, or ask for a work or school excuse. You will get an email in the next two days asking about your experience. I hope that your e-visit has been valuable and will speed your recovery.  Meds ordered this encounter  Medications   ofloxacin (OCUFLOX) 0.3 % ophthalmic solution    Sig: Place 1 drop into the right eye 4 (four) times daily for 5 days.    Dispense:  5 mL    Refill:  0    I spent approximately 5 minutes reviewing the patient's history, current symptoms and coordinating their care today.

## 2023-04-04 ENCOUNTER — Encounter: Payer: Self-pay | Admitting: Gastroenterology

## 2023-04-06 DIAGNOSIS — H15121 Nodular episcleritis, right eye: Secondary | ICD-10-CM | POA: Diagnosis not present

## 2023-04-10 ENCOUNTER — Other Ambulatory Visit: Payer: Self-pay | Admitting: Physician Assistant

## 2023-04-10 DIAGNOSIS — B9689 Other specified bacterial agents as the cause of diseases classified elsewhere: Secondary | ICD-10-CM

## 2023-04-11 MED ORDER — ALBUTEROL SULFATE HFA 108 (90 BASE) MCG/ACT IN AERS: 1.0000 | INHALATION_SPRAY | Freq: Four times a day (QID) | RESPIRATORY_TRACT | 0 refills | Status: DC | PRN

## 2023-04-13 DIAGNOSIS — F4310 Post-traumatic stress disorder, unspecified: Secondary | ICD-10-CM | POA: Diagnosis not present

## 2023-04-20 DIAGNOSIS — F4310 Post-traumatic stress disorder, unspecified: Secondary | ICD-10-CM | POA: Diagnosis not present

## 2023-04-27 DIAGNOSIS — F4310 Post-traumatic stress disorder, unspecified: Secondary | ICD-10-CM | POA: Diagnosis not present

## 2023-05-01 ENCOUNTER — Ambulatory Visit: Payer: BC Managed Care – PPO | Admitting: Physician Assistant

## 2023-05-02 ENCOUNTER — Encounter: Payer: Self-pay | Admitting: Family

## 2023-05-04 DIAGNOSIS — F4310 Post-traumatic stress disorder, unspecified: Secondary | ICD-10-CM | POA: Diagnosis not present

## 2023-05-05 ENCOUNTER — Ambulatory Visit: Payer: BC Managed Care – PPO | Admitting: Family

## 2023-05-08 ENCOUNTER — Other Ambulatory Visit: Payer: BC Managed Care – PPO | Attending: Oncology

## 2023-05-11 DIAGNOSIS — F4310 Post-traumatic stress disorder, unspecified: Secondary | ICD-10-CM | POA: Diagnosis not present

## 2023-05-18 DIAGNOSIS — F4310 Post-traumatic stress disorder, unspecified: Secondary | ICD-10-CM | POA: Diagnosis not present

## 2023-05-25 ENCOUNTER — Other Ambulatory Visit: Payer: Self-pay | Admitting: Family

## 2023-05-25 ENCOUNTER — Ambulatory Visit: Payer: BC Managed Care – PPO | Admitting: Family

## 2023-05-25 DIAGNOSIS — R052 Subacute cough: Secondary | ICD-10-CM | POA: Diagnosis not present

## 2023-05-25 DIAGNOSIS — B9689 Other specified bacterial agents as the cause of diseases classified elsewhere: Secondary | ICD-10-CM

## 2023-05-25 DIAGNOSIS — F4310 Post-traumatic stress disorder, unspecified: Secondary | ICD-10-CM | POA: Diagnosis not present

## 2023-05-25 DIAGNOSIS — J3089 Other allergic rhinitis: Secondary | ICD-10-CM | POA: Diagnosis not present

## 2023-05-25 NOTE — Telephone Encounter (Signed)
Spoke to pt, pt states she only uses the inhaler as needed. Pt states she isn't sure why the pharmacy requests the refills as frequent as they do. Pt states the only refill that should've been on file, was the one made by her allergist, in their most recent visit. Pt wanted to let Dugal know she will be starting her allergy shots next week. Scheduled cpe for 08/03/23

## 2023-05-26 NOTE — Telephone Encounter (Signed)
Noted  

## 2023-06-01 DIAGNOSIS — F4310 Post-traumatic stress disorder, unspecified: Secondary | ICD-10-CM | POA: Diagnosis not present

## 2023-06-08 DIAGNOSIS — F4310 Post-traumatic stress disorder, unspecified: Secondary | ICD-10-CM | POA: Diagnosis not present

## 2023-06-13 DIAGNOSIS — F321 Major depressive disorder, single episode, moderate: Secondary | ICD-10-CM | POA: Diagnosis not present

## 2023-06-13 DIAGNOSIS — F411 Generalized anxiety disorder: Secondary | ICD-10-CM | POA: Diagnosis not present

## 2023-06-15 DIAGNOSIS — F4310 Post-traumatic stress disorder, unspecified: Secondary | ICD-10-CM | POA: Diagnosis not present

## 2023-06-19 DIAGNOSIS — F410 Panic disorder [episodic paroxysmal anxiety] without agoraphobia: Secondary | ICD-10-CM | POA: Diagnosis not present

## 2023-06-19 DIAGNOSIS — F329 Major depressive disorder, single episode, unspecified: Secondary | ICD-10-CM | POA: Diagnosis not present

## 2023-06-19 DIAGNOSIS — F411 Generalized anxiety disorder: Secondary | ICD-10-CM | POA: Diagnosis not present

## 2023-06-21 DIAGNOSIS — F4310 Post-traumatic stress disorder, unspecified: Secondary | ICD-10-CM | POA: Diagnosis not present

## 2023-06-29 ENCOUNTER — Other Ambulatory Visit: Payer: Self-pay | Admitting: Family

## 2023-06-29 DIAGNOSIS — J309 Allergic rhinitis, unspecified: Secondary | ICD-10-CM

## 2023-06-29 DIAGNOSIS — F4310 Post-traumatic stress disorder, unspecified: Secondary | ICD-10-CM | POA: Diagnosis not present

## 2023-07-06 DIAGNOSIS — F4310 Post-traumatic stress disorder, unspecified: Secondary | ICD-10-CM | POA: Diagnosis not present

## 2023-07-07 DIAGNOSIS — F4310 Post-traumatic stress disorder, unspecified: Secondary | ICD-10-CM | POA: Diagnosis not present

## 2023-07-10 DIAGNOSIS — F4310 Post-traumatic stress disorder, unspecified: Secondary | ICD-10-CM | POA: Diagnosis not present

## 2023-07-24 DIAGNOSIS — F410 Panic disorder [episodic paroxysmal anxiety] without agoraphobia: Secondary | ICD-10-CM | POA: Diagnosis not present

## 2023-07-24 DIAGNOSIS — F411 Generalized anxiety disorder: Secondary | ICD-10-CM | POA: Diagnosis not present

## 2023-07-24 DIAGNOSIS — F329 Major depressive disorder, single episode, unspecified: Secondary | ICD-10-CM | POA: Diagnosis not present

## 2023-08-03 ENCOUNTER — Encounter: Payer: Self-pay | Admitting: Family

## 2023-08-03 ENCOUNTER — Ambulatory Visit (INDEPENDENT_AMBULATORY_CARE_PROVIDER_SITE_OTHER): Payer: Managed Care, Other (non HMO) | Admitting: Family

## 2023-08-03 VITALS — BP 130/82 | HR 96 | Temp 98.5°F | Ht 62.0 in | Wt 154.0 lb

## 2023-08-03 DIAGNOSIS — Z8261 Family history of arthritis: Secondary | ICD-10-CM | POA: Diagnosis not present

## 2023-08-03 DIAGNOSIS — L900 Lichen sclerosus et atrophicus: Secondary | ICD-10-CM | POA: Diagnosis not present

## 2023-08-03 DIAGNOSIS — R7303 Prediabetes: Secondary | ICD-10-CM

## 2023-08-03 DIAGNOSIS — Z23 Encounter for immunization: Secondary | ICD-10-CM

## 2023-08-03 DIAGNOSIS — M2550 Pain in unspecified joint: Secondary | ICD-10-CM | POA: Diagnosis not present

## 2023-08-03 DIAGNOSIS — Z0000 Encounter for general adult medical examination without abnormal findings: Secondary | ICD-10-CM

## 2023-08-03 DIAGNOSIS — F418 Other specified anxiety disorders: Secondary | ICD-10-CM

## 2023-08-03 DIAGNOSIS — I10 Essential (primary) hypertension: Secondary | ICD-10-CM

## 2023-08-03 DIAGNOSIS — N951 Menopausal and female climacteric states: Secondary | ICD-10-CM | POA: Insufficient documentation

## 2023-08-03 DIAGNOSIS — Z0001 Encounter for general adult medical examination with abnormal findings: Secondary | ICD-10-CM | POA: Insufficient documentation

## 2023-08-03 LAB — MICROALBUMIN / CREATININE URINE RATIO
Creatinine,U: 125.4 mg/dL
Microalb Creat Ratio: 0.6 mg/g (ref 0.0–30.0)
Microalb, Ur: <0.7 mg/dL (ref 0.0–1.9)

## 2023-08-03 LAB — BASIC METABOLIC PANEL
BUN: 13 mg/dL (ref 6–23)
CO2: 26 meq/L (ref 19–32)
Calcium: 9.3 mg/dL (ref 8.4–10.5)
Chloride: 103 meq/L (ref 96–112)
Creatinine, Ser: 0.82 mg/dL (ref 0.40–1.20)
GFR: 82.48 mL/min (ref >60.00)
Glucose, Bld: 117 mg/dL — ABNORMAL HIGH (ref 70–99)
Potassium: 4.2 meq/L (ref 3.5–5.1)
Sodium: 138 meq/L (ref 135–145)

## 2023-08-03 LAB — CBC
HCT: 43.8 % (ref 36.0–46.0)
Hemoglobin: 14.5 g/dL (ref 12.0–15.0)
MCHC: 33.1 g/dL (ref 30.0–36.0)
MCV: 87 fL (ref 78.0–100.0)
Platelets: 343 10*3/uL (ref 150.0–400.0)
RBC: 5.04 Mil/uL (ref 3.87–5.11)
RDW: 14 % (ref 11.5–15.5)
WBC: 6.4 10*3/uL (ref 4.0–10.5)

## 2023-08-03 LAB — HEMOGLOBIN A1C: Hgb A1c MFr Bld: 6.9 % — ABNORMAL HIGH (ref 4.6–6.5)

## 2023-08-03 NOTE — Assessment & Plan Note (Addendum)
 Patient Counseling(The following topics were reviewed):  Preventative care handout given to pt  Health maintenance and immunizations reviewed. Please refer to Health maintenance section. Pt advised on safe sex, wearing seatbelts in car, and proper nutrition labwork ordered today for annual Dental health: Discussed importance of regular tooth brushing, flossing, and dental visits.  Additional discussion on acute concerns totaled 21 minutes

## 2023-08-03 NOTE — Progress Notes (Signed)
 Subjective:  Patient ID: Annette Gonzales Shoulder, female    DOB: Jan 08, 1971  Age: 53 y.o. MRN: [id number]  Patient Care Team: Corwin Antu, FNP as PCP - General (Family Medicine)   CC:  Chief Complaint  Patient presents with   Annual Exam    HPI Annette Gonzales is a 53 y.o. female who presents today for an annual physical exam. She reports consuming a general diet. The patient does not participate in regular exercise at present. She generally feels well. She reports sleeping well. She does have additional problems to discuss today.   Vision:Within last year Dental:Receives regular dental care  Mammogram: overdue she will schedule  Last pap: unsure? She thinks 2019/2020. Has previous dx with biopsy and was found to have lichen sclerosis. Was referred to gynecology however she did not make appt.   TDAP: will get today  Shingles vaccine, not interested today.   Pt is with acute concerns.   Interested in hormone replacement therapy. She thinks the majority of her problems are hot flash related, joints aching, dry skin, hot flashes, and she is now gaining weight which is frustrating for her. Curious if she is a candidate because mom was in her 2's when she was diagnosed with breast cancer.   Mammogram 09/23/21. Did not schedule in 2024. She does have order in just needs to call.   Colonoscopy: 03/31/23, repeat in ten years.  EGD: 03/31/23, normal findings. Was biopsied.   Still having issues with constantly feeling stomach upset and ongoing diarrhea. Alpha gal negative and celiac negative. Has not tried FOD diet.   Depression with anxiety, did recently establish with a new psychiatrist. She is currently back on sertraline  at 50 mg once daily about to increase to 100 mg once daily. She did try viibryd but it made her feel pretty sick.   Eye doctor she states noted swelling of white of her eye (assuming sclera) he looked behind her eye and advised her that what was behind her eye 'might be a  precursor to autoimmune disease such as RA.   Advanced Directives Patient does not have advanced directives   DEPRESSION SCREENING    08/03/2023   11:11 AM 08/03/2022   11:22 AM 12/30/2021   11:35 AM 05/12/2021   12:15 PM 04/19/2021    3:19 PM 10/30/2020   12:19 PM 05/18/2020    8:04 AM  PHQ 2/9 Scores  PHQ - 2 Score 2 1 0 2 4 2  0  PHQ- 9 Score 14 3   14 9 1      ROS: Negative unless specifically indicated above in HPI.    Current Outpatient Medications:    albuterol  (VENTOLIN  HFA) 108 (90 Base) MCG/ACT inhaler, Inhale 1-2 puffs into the lungs every 6 (six) hours as needed for wheezing or shortness of breath., Disp: 18 g, Rfl: 0   ALPRAZolam (XANAX) 0.5 MG tablet, Take 0.5 mg by mouth at bedtime as needed for anxiety., Disp: , Rfl:    fluticasone  (FLONASE ) 50 MCG/ACT nasal spray, Place 2 sprays into both nostrils daily., Disp: 16 g, Rfl: 2   levocetirizine (XYZAL ) 5 MG tablet, Take 5 mg by mouth every evening., Disp: , Rfl:    lisinopril  (ZESTRIL ) 10 MG tablet, Take 1 tablet (10 mg total) by mouth daily., Disp: 90 tablet, Rfl: 2   montelukast  (SINGULAIR ) 10 MG tablet, TAKE 1 TABLET BY MOUTH EVERYDAY AT BEDTIME, Disp: 90 tablet, Rfl: 0   ondansetron  (ZOFRAN -ODT) 4 MG disintegrating tablet, Take 1 tablet (  4 mg total) by mouth every 8 (eight) hours as needed for nausea or vomiting., Disp: 15 tablet, Rfl: 0   sertraline  (ZOLOFT ) 100 MG tablet, Take two tablets once daily (Patient taking differently: Take 100 mg by mouth daily. Take two tablets once daily), Disp: 180 tablet, Rfl: 1    Objective:    BP 130/82 (BP Location: Left Arm, Patient Position: Sitting, Cuff Size: Normal)   Pulse 96   Temp 98.5 F (36.9 C) (Temporal)   Ht 5' 2 (1.575 m)   Wt 154 lb (69.9 kg)   SpO2 99%   BMI 28.17 kg/m   BP Readings from Last 3 Encounters:  08/03/23 130/82  03/31/23 102/70  02/07/23 130/86      Physical Exam Constitutional:      General: She is not in acute distress.    Appearance:  Normal appearance. She is normal weight. She is not ill-appearing.  HENT:     Head: Normocephalic.     Right Ear: Tympanic membrane normal.     Left Ear: Tympanic membrane normal.     Nose: Nose normal.     Mouth/Throat:     Mouth: Mucous membranes are moist.  Eyes:     Extraocular Movements: Extraocular movements intact.     Pupils: Pupils are equal, round, and reactive to light.  Cardiovascular:     Rate and Rhythm: Normal rate and regular rhythm.  Pulmonary:     Effort: Pulmonary effort is normal.     Breath sounds: Normal breath sounds.  Abdominal:     General: Abdomen is flat. Bowel sounds are normal.     Palpations: Abdomen is soft.     Tenderness: There is no guarding or rebound.  Musculoskeletal:        General: Normal range of motion.     Cervical back: Normal range of motion.  Skin:    General: Skin is warm.     Capillary Refill: Capillary refill takes less than 2 seconds.  Neurological:     General: No focal deficit present.     Mental Status: She is alert.  Psychiatric:        Mood and Affect: Mood normal.        Behavior: Behavior normal.        Thought Content: Thought content normal.        Judgment: Judgment normal.          Assessment & Plan:  Polyarthralgia -     ANA w/Reflex -     Rheumatoid factor  Lichen sclerosus Assessment & Plan: Pt has yet to f/u with gynecology  Needing pap as well  Referral placed pt states she will schedule  Orders: -     ANA w/Reflex -     Ambulatory referral to Gynecology  Prediabetes Assessment & Plan: Pt advised of the following: Work on a diabetic diet, try to incorporate exercise at least 20-30 a day for 3 days a week or more.  A1c ordered today pending results  Orders: -     Hemoglobin A1c  Family history of rheumatoid arthritis  Encounter for general adult medical examination with abnormal findings Assessment & Plan: Patient Counseling(The following topics were reviewed):  Preventative care  handout given to pt  Health maintenance and immunizations reviewed. Please refer to Health maintenance section. Pt advised on safe sex, wearing seatbelts in car, and proper nutrition labwork ordered today for annual Dental health: Discussed importance of regular tooth brushing, flossing, and dental visits.  Additional discussion on acute concerns totaled 21 minutes    Orders: -     Hemoglobin A1c -     Basic metabolic panel -     CBC -     Microalbumin / creatinine urine ratio  Depression with anxiety Assessment & Plan: Managed by psychiatry    Hot flashes due to menopause Assessment & Plan: Discussed otc black cohash trial  Also discussed with her that increasing sertraline  to 100 mg might be helpful as some success with symptom control shown with SSRI.  Hesitant for HRT due to mom with h/o breast cancer in her 36's.  Could consider veozah  if we could get approved with insurance.    Benign essential HTN Assessment & Plan: Stable  Cont lisinopril  10 mg once daily    Other orders -     Tdap vaccine greater than or equal to 7yo IM      Follow-up: Return in about 6 months (around 01/31/2024) for f/u blood pressure.   Ginger Patrick, FNP

## 2023-08-03 NOTE — Assessment & Plan Note (Signed)
 Pt has yet to f/u with gynecology  Needing pap as well  Referral placed pt states she will schedule

## 2023-08-03 NOTE — Assessment & Plan Note (Signed)
 Managed by psychiatry

## 2023-08-03 NOTE — Assessment & Plan Note (Signed)
Pt advised of the following: Work on a diabetic diet, try to incorporate exercise at least 20-30 a day for 3 days a week or more.  A1c ordered today pending results.

## 2023-08-03 NOTE — Assessment & Plan Note (Signed)
 Stable  Cont lisinopril 10 mg once daily

## 2023-08-03 NOTE — Assessment & Plan Note (Signed)
 Discussed otc black cohash trial  Also discussed with her that increasing sertraline  to 100 mg might be helpful as some success with symptom control shown with SSRI.  Hesitant for HRT due to mom with h/o breast cancer in her 26's.  Could consider veozah  if we could get approved with insurance.

## 2023-08-04 ENCOUNTER — Encounter: Payer: Self-pay | Admitting: Family

## 2023-08-04 ENCOUNTER — Other Ambulatory Visit: Payer: Self-pay | Admitting: Family

## 2023-08-04 DIAGNOSIS — E119 Type 2 diabetes mellitus without complications: Secondary | ICD-10-CM

## 2023-08-04 LAB — RHEUMATOID FACTOR: Rheumatoid fact SerPl-aCnc: <10 [IU]/mL (ref <14)

## 2023-08-04 LAB — ANA W/REFLEX: Anti Nuclear Antibody (ANA): NEGATIVE

## 2023-08-06 ENCOUNTER — Telehealth: Payer: Managed Care, Other (non HMO) | Admitting: Family

## 2023-08-06 DIAGNOSIS — U071 COVID-19: Secondary | ICD-10-CM

## 2023-08-06 MED ORDER — PROMETHAZINE-DM 6.25-15 MG/5ML PO SYRP
5.0000 mL | ORAL_SOLUTION | Freq: Three times a day (TID) | ORAL | 0 refills | Status: DC | PRN
Start: 2023-08-06 — End: 2023-09-25

## 2023-08-06 MED ORDER — BENZONATATE 100 MG PO CAPS
100.0000 mg | ORAL_CAPSULE | Freq: Three times a day (TID) | ORAL | 0 refills | Status: DC | PRN
Start: 2023-08-06 — End: 2023-09-25

## 2023-08-06 MED ORDER — NIRMATRELVIR/RITONAVIR (PAXLOVID)TABLET
3.0000 | ORAL_TABLET | Freq: Two times a day (BID) | ORAL | 0 refills | Status: AC
Start: 2023-08-06 — End: 2023-08-11

## 2023-08-06 NOTE — Progress Notes (Signed)
 Virtual Visit Consent   Annette Gonzales, you are scheduled for a virtual visit with a  provider today. Just as with appointments in the office, your consent must be obtained to participate. Your consent will be active for this visit and any virtual visit you may have with one of our providers in the next 365 days. If you have a MyChart account, a copy of this consent can be sent to you electronically.  As this is a virtual visit, video technology does not allow for your provider to perform a traditional examination. This may limit your provider's ability to fully assess your condition. If your provider identifies any concerns that need to be evaluated in person or the need to arrange testing (such as labs, EKG, etc.), we will make arrangements to do so. Although advances in technology are sophisticated, we cannot ensure that it will always work on either your end or our end. If the connection with a video visit is poor, the visit may have to be switched to a telephone visit. With either a video or telephone visit, we are not always able to ensure that we have a secure connection.  By engaging in this virtual visit, you consent to the provision of healthcare and authorize for your insurance to be billed (if applicable) for the services provided during this visit. Depending on your insurance coverage, you may receive a charge related to this service.  I need to obtain your verbal consent now. Are you willing to proceed with your visit today? Annette Gonzales has provided verbal consent on 08/06/2023 for a virtual visit (video or telephone). Bari Learn, FNP  Date: 08/06/2023 3:09 PM  Virtual Visit via Video Note   I, Bari Learn, connected with  Annette Gonzales  ([id number], 1971-06-21) on 08/06/23 at  3:15 PM EST by a video-enabled telemedicine application and verified that I am speaking with the correct person using two identifiers.  Location: Patient: Virtual Visit Location Patient:  Home Provider: Virtual Visit Location Provider: Home Office   I discussed the limitations of evaluation and management by telemedicine and the availability of in person appointments. The patient expressed understanding and agreed to proceed.    History of Present Illness: Annette Gonzales is a 53 y.o. who identifies as a female who was assigned female at birth, and is being seen today for COVID. She reports her symptoms started two days ago and have worsen.   HPI: URI  This is a new problem. The current episode started in the past 7 days. The problem has been gradually worsening. The maximum temperature recorded prior to her arrival was 102 - 102.9 F. Associated symptoms include congestion, coughing, ear pain, headaches, joint pain, rhinorrhea, sinus pain, sneezing, a sore throat and wheezing. She has tried acetaminophen  and decongestant for the symptoms. The treatment provided mild relief.    Problems:  Patient Active Problem List   Diagnosis Date Noted   Hot flashes due to menopause 08/03/2023   Kidney stone on left side 08/03/2022   Ovarian cyst, left 08/03/2022   Prediabetes 08/03/2022   Mild intermittent asthma without complication 08/03/2022   Eosinophilia 08/03/2022   Polyarthralgia 08/03/2022   Lichen sclerosus 08/03/2022   Hepatic steatosis 12/31/2021   Allergic rhinitis 09/17/2018   Stress incontinence of urine 07/07/2016   Depression with anxiety 04/24/2015   Diverticulosis of colon without hemorrhage 04/24/2015   GERD (gastroesophageal reflux disease) 04/24/2015   History of gastric ulcer 04/24/2015   Benign essential HTN  04/24/2015   HLD (hyperlipidemia) 04/24/2015   Overweight 04/24/2015    Allergies:  Allergies  Allergen Reactions   Sulfa Antibiotics Other (See Comments)    Nausea and hot flashes   Medications:  Current Outpatient Medications:    benzonatate  (TESSALON  PERLES) 100 MG capsule, Take 1 capsule (100 mg total) by mouth 3 (three) times daily as  needed., Disp: 20 capsule, Rfl: 0   nirmatrelvir /ritonavir  (PAXLOVID ) 20 x 150 MG & 10 x 100MG  TABS, Take 3 tablets by mouth 2 (two) times daily for 5 days. (Take nirmatrelvir  150 mg two tablets twice daily for 5 days and ritonavir  100 mg one tablet twice daily for 5 days) Patient GFR is 83, Disp: 30 tablet, Rfl: 0   promethazine -dextromethorphan (PROMETHAZINE -DM) 6.25-15 MG/5ML syrup, Take 5 mLs by mouth 3 (three) times daily as needed for cough., Disp: 118 mL, Rfl: 0   albuterol  (VENTOLIN  HFA) 108 (90 Base) MCG/ACT inhaler, Inhale 1-2 puffs into the lungs every 6 (six) hours as needed for wheezing or shortness of breath., Disp: 18 g, Rfl: 0   ALPRAZolam (XANAX) 0.5 MG tablet, Take 0.5 mg by mouth at bedtime as needed for anxiety., Disp: , Rfl:    fluticasone  (FLONASE ) 50 MCG/ACT nasal spray, Place 2 sprays into both nostrils daily., Disp: 16 g, Rfl: 2   levocetirizine (XYZAL ) 5 MG tablet, Take 5 mg by mouth every evening., Disp: , Rfl:    lisinopril  (ZESTRIL ) 10 MG tablet, Take 1 tablet (10 mg total) by mouth daily., Disp: 90 tablet, Rfl: 2   montelukast  (SINGULAIR ) 10 MG tablet, TAKE 1 TABLET BY MOUTH EVERYDAY AT BEDTIME, Disp: 90 tablet, Rfl: 0   ondansetron  (ZOFRAN -ODT) 4 MG disintegrating tablet, Take 1 tablet (4 mg total) by mouth every 8 (eight) hours as needed for nausea or vomiting., Disp: 15 tablet, Rfl: 0   sertraline  (ZOLOFT ) 100 MG tablet, Take two tablets once daily (Patient taking differently: Take 100 mg by mouth daily. Take two tablets once daily), Disp: 180 tablet, Rfl: 1  Observations/Objective: Patient is well-developed, well-nourished in no acute distress.  Resting comfortably  at home.  Head is normocephalic, atraumatic.  No labored breathing.  Speech is clear and coherent with logical content.  Patient is alert and oriented at baseline.  Coarse nonproductive cough  Assessment and Plan: 1. COVID-19 (Primary) - nirmatrelvir /ritonavir  (PAXLOVID ) 20 x 150 MG & 10 x 100MG   TABS; Take 3 tablets by mouth 2 (two) times daily for 5 days. (Take nirmatrelvir  150 mg two tablets twice daily for 5 days and ritonavir  100 mg one tablet twice daily for 5 days) Patient GFR is 83  Dispense: 30 tablet; Refill: 0 - benzonatate  (TESSALON  PERLES) 100 MG capsule; Take 1 capsule (100 mg total) by mouth 3 (three) times daily as needed.  Dispense: 20 capsule; Refill: 0 - promethazine -dextromethorphan (PROMETHAZINE -DM) 6.25-15 MG/5ML syrup; Take 5 mLs by mouth 3 (three) times daily as needed for cough.  Dispense: 118 mL; Refill: 0  COVID positive, rest, force fluids, tylenol  as needed,  report any worsening symptoms such as increased shortness of breath, swelling, or continued high fevers. Possible adverse effects discussed with antivirals.    Follow Up Instructions: I discussed the assessment and treatment plan with the patient. The patient was provided an opportunity to ask questions and all were answered. The patient agreed with the plan and demonstrated an understanding of the instructions.  A copy of instructions were sent to the patient via MyChart unless otherwise noted below.  The patient was advised to call back or seek an in-person evaluation if the symptoms worsen or if the condition fails to improve as anticipated.    Bari Learn, FNP

## 2023-08-07 MED ORDER — METFORMIN HCL 500 MG PO TABS
500.0000 mg | ORAL_TABLET | Freq: Two times a day (BID) | ORAL | 3 refills | Status: DC
Start: 2023-08-07 — End: 2024-05-29

## 2023-08-09 ENCOUNTER — Encounter: Payer: Managed Care, Other (non HMO) | Admitting: Obstetrics

## 2023-08-09 ENCOUNTER — Encounter: Payer: Self-pay | Admitting: Family

## 2023-08-09 DIAGNOSIS — R110 Nausea: Secondary | ICD-10-CM

## 2023-08-09 MED ORDER — ONDANSETRON 4 MG PO TBDP
4.0000 mg | ORAL_TABLET | Freq: Three times a day (TID) | ORAL | 0 refills | Status: DC | PRN
Start: 2023-08-09 — End: 2023-08-23

## 2023-08-13 ENCOUNTER — Telehealth: Payer: Managed Care, Other (non HMO) | Admitting: Physician Assistant

## 2023-08-13 DIAGNOSIS — J069 Acute upper respiratory infection, unspecified: Secondary | ICD-10-CM

## 2023-08-13 DIAGNOSIS — U071 COVID-19: Secondary | ICD-10-CM

## 2023-08-13 MED ORDER — CETIRIZINE HCL 10 MG PO TABS
10.0000 mg | ORAL_TABLET | Freq: Every day | ORAL | 0 refills | Status: DC
Start: 2023-08-13 — End: 2023-09-11

## 2023-08-13 MED ORDER — AZELASTINE HCL 0.1 % NA SOLN: 2.0000 | Freq: Two times a day (BID) | NASAL | 12 refills | Status: DC

## 2023-08-13 NOTE — Progress Notes (Signed)
I have spent 5 minutes in review of e-visit questionnaire, review and updating patient chart, medical decision making and response to patient.   Laure Kidney, PA-C

## 2023-08-13 NOTE — Progress Notes (Signed)
E-Visit  for Positive Covid Test Result   We are sorry you are not feeling well. We are here to help!  You have tested positive for COVID-19, meaning that you were infected with the novel coronavirus and could give the virus to others.  Most people with COVID-19 have mild illness and can recover at home without medical care. Do not leave your home, except to get medical care. Do not visit public areas and do not go to places where you are unable to wear a mask. It is important that you stay home  to take care for yourself and to help protect other people in your home and community.     Providers prescribe antibiotics to treat infections caused by bacteria. Antibiotics are very powerful in treating bacterial infections when they are used properly. To maintain their effectiveness, they should be used only when necessary. Overuse of antibiotics has resulted in the development of superbugs that are resistant to treatment!    After careful review of your answers, I would not recommend an antibiotic for your condition.  Antibiotics are not effective against viruses and therefore should not be used to treat them. Common examples of infections caused by viruses include colds, COVID, and flu    Isolation Instructions:   You are to isolate at home until you have been fever free for at least 24 hours without a fever-reducing medication, and symptoms have been steadily improving for 24 hours. At that time,  you can end isolation but need to mask for an additional 5 days.  If you must be around other household members who do not have symptoms, you need to make sure that both you and the family members are masking consistently with a high-quality mask.  If you note any worsening of symptoms despite treatment, please seek an in-person evaluation ASAP. If you note any significant shortness of breath or any chest pain, please seek ER evaluation. Please do not delay care!   Go to the nearest hospital ED for  assessment if fever/cough/breathlessness are severe or illness seems like a threat to life.    The following symptoms may appear 2-14 days after exposure: Fever Cough Shortness of breath or difficulty breathing Chills Repeated shaking with chills Muscle pain Headache Sore throat New loss of taste or smell Fatigue Congestion or runny nose Nausea or vomiting Diarrhea  You can use medication such as prescription cough medication called Tessalon Perles 100 mg. You may take 1-2 capsules every 8 hours as needed for cough and prescription for Azelastine nasal spray 2 sprays in each nostril twice per day  You may also take acetaminophen (Tylenol) as needed for fever.  HOME CARE: Only take medications as instructed by your medical team. Drink plenty of fluids and get plenty of rest. A steam or ultrasonic humidifier can help if you have congestion.   GET HELP RIGHT AWAY IF YOU HAVE EMERGENCY WARNING SIGNS.  Call 911 or proceed to your closest emergency facility if: You develop worsening high fever. Trouble breathing Bluish lips or face Persistent pain or pressure in the chest New confusion Inability to wake or stay awake You cough up blood. Your symptoms become more severe Inability to hold down food or fluids  This list is not all possible symptoms. Contact your medical provider for any symptoms that are severe or concerning to you.   Your e-visit answers were reviewed by a board certified advanced clinical practitioner to complete your personal care plan.  Depending on the condition,  your plan could have included both over the counter or prescription medications.  If there is a problem please reply once you have received a response from your provider.  Your safety is important to [place].  If you have drug allergies check your prescription carefully.    You can use MyChart to ask questions about today's visit, request a non-urgent call back, or ask for a work or school excuse for 24  hours related to this e-Visit. If it has been greater than 24 hours you will need to follow up with your provider, or enter a new e-Visit to address those concerns. You will get an e-mail in the next two days asking about your experience.  I hope that your e-visit has been valuable and will speed your recovery. Thank you for using e-visits.

## 2023-08-18 NOTE — Progress Notes (Deleted)
    GYNECOLOGY PROGRESS NOTE  Subjective:  PCP: Mort Sawyers, FNP  Patient ID: Annette Gonzales, female    DOB: 1970-09-28, 53 y.o.   MRN: [id number]  HPI  Patient is a 53 y.o. No obstetric history on file. female who presents for   {Common ambulatory SmartLinks:19316}  Review of Systems {ros; complete:30496}   Objective:   There were no vitals taken for this visit. There is no height or weight on file to calculate BMI.  General appearance: {general exam:16600} Abdomen: {abdominal exam:16834} Pelvic: {pelvic exam:16852::"cervix normal in appearance","external genitalia normal","no adnexal masses or tenderness","no cervical motion tenderness","rectovaginal septum normal","uterus normal size, shape, and consistency","vagina normal without discharge"} Extremities: {extremity exam:5109} Neurologic: {neuro exam:17854}   Assessment/Plan:   No diagnosis found.   There are no diagnoses linked to this encounter.     Julieanne Manson, DO [facility]

## 2023-08-21 ENCOUNTER — Encounter: Payer: Managed Care, Other (non HMO) | Admitting: Obstetrics

## 2023-08-22 ENCOUNTER — Ambulatory Visit: Payer: BC Managed Care – PPO | Admitting: Physician Assistant

## 2023-08-22 ENCOUNTER — Encounter: Payer: Self-pay | Admitting: Family

## 2023-08-23 ENCOUNTER — Emergency Department
Admission: EM | Admit: 2023-08-23 | Discharge: 2023-08-23 | Disposition: A | Discharge status: Home / Self Care | Payer: Managed Care, Other (non HMO) | Attending: Student in an Organized Health Care Education/Training Program | Admitting: Student in an Organized Health Care Education/Training Program

## 2023-08-23 ENCOUNTER — Other Ambulatory Visit: Payer: Self-pay

## 2023-08-23 ENCOUNTER — Emergency Department: Payer: Managed Care, Other (non HMO)

## 2023-08-23 DIAGNOSIS — R1032 Left lower quadrant pain: Secondary | ICD-10-CM | POA: Diagnosis present

## 2023-08-23 DIAGNOSIS — K5732 Diverticulitis of large intestine without perforation or abscess without bleeding: Secondary | ICD-10-CM | POA: Insufficient documentation

## 2023-08-23 DIAGNOSIS — K5792 Diverticulitis of intestine, part unspecified, without perforation or abscess without bleeding: Secondary | ICD-10-CM

## 2023-08-23 LAB — COMPREHENSIVE METABOLIC PANEL
ALT: 22 U/L (ref 0–44)
AST: 24 U/L (ref 15–41)
Albumin: 4 g/dL (ref 3.5–5.0)
Alkaline Phosphatase: 66 U/L (ref 38–126)
Anion gap: 12 (ref 5–15)
BUN: 13 mg/dL (ref 6–20)
CO2: 20 mmol/L — ABNORMAL LOW (ref 22–32)
Calcium: 8.8 mg/dL — ABNORMAL LOW (ref 8.9–10.3)
Chloride: 102 mmol/L (ref 98–111)
Creatinine, Ser: 0.77 mg/dL (ref 0.44–1.00)
GFR, Estimated: >60 mL/min (ref >60)
Glucose, Bld: 165 mg/dL — ABNORMAL HIGH (ref 70–99)
Potassium: 3.9 mmol/L (ref 3.5–5.1)
Sodium: 134 mmol/L — ABNORMAL LOW (ref 135–145)
Total Bilirubin: 0.8 mg/dL (ref 0.0–1.2)
Total Protein: 7.6 g/dL (ref 6.5–8.1)

## 2023-08-23 LAB — URINALYSIS, ROUTINE W REFLEX MICROSCOPIC
Bacteria, UA: NONE SEEN
Bilirubin Urine: NEGATIVE
Glucose, UA: NEGATIVE mg/dL
Hgb urine dipstick: NEGATIVE
Ketones, ur: NEGATIVE mg/dL
Nitrite: NEGATIVE
Protein, ur: 30 mg/dL — AB
Specific Gravity, Urine: 1.026 (ref 1.005–1.030)
pH: 5 (ref 5.0–8.0)

## 2023-08-23 LAB — CBC
HCT: 39.8 % (ref 36.0–46.0)
Hemoglobin: 13.2 g/dL (ref 12.0–15.0)
MCH: 28.1 pg (ref 26.0–34.0)
MCHC: 33.2 g/dL (ref 30.0–36.0)
MCV: 84.7 fL (ref 80.0–100.0)
Platelets: 336 10*3/uL (ref 150–400)
RBC: 4.7 MIL/uL (ref 3.87–5.11)
RDW: 13.3 % (ref 11.5–15.5)
WBC: 15.6 10*3/uL — ABNORMAL HIGH (ref 4.0–10.5)
nRBC: 0 % (ref 0.0–0.2)

## 2023-08-23 LAB — LIPASE, BLOOD: Lipase: 34 U/L (ref 11–51)

## 2023-08-23 MED ORDER — METOCLOPRAMIDE HCL 5 MG/ML IJ SOLN
10.0000 mg | Freq: Once | INTRAMUSCULAR | Status: AC
  Administered 2023-08-23: 10 mg via INTRAVENOUS
  Filled 2023-08-23 (×2): qty 2

## 2023-08-23 MED ORDER — ONDANSETRON 4 MG PO TBDP: 4.0000 mg | ORAL_TABLET | Freq: Three times a day (TID) | ORAL | 0 refills | Status: DC | PRN

## 2023-08-23 MED ORDER — OXYCODONE-ACETAMINOPHEN 5-325 MG PO TABS
1.0000 | ORAL_TABLET | ORAL | 0 refills | Status: DC | PRN
Start: 2023-08-23 — End: 2023-09-25

## 2023-08-23 MED ORDER — AMOXICILLIN-POT CLAVULANATE 875-125 MG PO TABS: 1.0000 | ORAL_TABLET | Freq: Two times a day (BID) | ORAL | 0 refills | Status: AC

## 2023-08-23 MED ORDER — SODIUM CHLORIDE 0.9 % IV BOLUS
1000.0000 mL | Freq: Once | INTRAVENOUS | Status: AC
  Administered 2023-08-23: 1000 mL via INTRAVENOUS

## 2023-08-23 MED ORDER — MORPHINE SULFATE (PF) 4 MG/ML IV SOLN
4.0000 mg | INTRAVENOUS | Status: DC | PRN
  Administered 2023-08-23: 4 mg via INTRAVENOUS
  Filled 2023-08-23: qty 1

## 2023-08-23 MED ORDER — ACETAMINOPHEN 500 MG PO TABS
1000.0000 mg | ORAL_TABLET | Freq: Once | ORAL | Status: AC
  Administered 2023-08-23: 1000 mg via ORAL
  Filled 2023-08-23: qty 2

## 2023-08-23 MED ORDER — IOHEXOL 300 MG/ML  SOLN
100.0000 mL | Freq: Once | INTRAMUSCULAR | Status: AC | PRN
  Administered 2023-08-23: 100 mL via INTRAVENOUS

## 2023-08-23 MED ORDER — ONDANSETRON 4 MG PO TBDP
4.0000 mg | ORAL_TABLET | Freq: Once | ORAL | Status: AC
  Administered 2023-08-23: 4 mg via ORAL
  Filled 2023-08-23: qty 1

## 2023-08-23 MED ORDER — PIPERACILLIN-TAZOBACTAM 3.375 G IVPB 30 MIN
3.3750 g | Freq: Once | INTRAVENOUS | Status: AC
  Administered 2023-08-23: 3.375 g via INTRAVENOUS
  Filled 2023-08-23 (×2): qty 50

## 2023-08-23 NOTE — ED Notes (Signed)
MD at bedside.

## 2023-08-23 NOTE — Telephone Encounter (Signed)
Spoke with pt and she states that she appreciates Tabitha wanting to get her in today for an appointment but she is in so much pain that she is just going to go to the ED to be evaluated.

## 2023-08-23 NOTE — ED Notes (Signed)
Messaged MD regarding pt's BP

## 2023-08-23 NOTE — ED Provider Triage Note (Signed)
Emergency Medicine Provider Triage Evaluation Note  Annette Gonzales , a 53 y.o. female  was evaluated in triage.  Pt complains of severe left sided abdominal pain x 3 days. Hx of left ovarian cyst, diverticulosis and multiple kidney stones.    Review of Systems  Positive: nausea Negative: fever  Physical Exam  BP 110/70 (BP Location: Right Arm)   Pulse (!) 123   Temp 98.5 F (36.9 C) (Oral)   Resp (!) 26   Wt 69 kg   SpO2 96%   BMI 27.82 kg/m  Gen:   Awake, no distress   Resp:  Normal effort  MSK:   Moves extremities without difficulty  Other:    Medical Decision Making  Medically screening exam initiated at 4:34 PM.  Appropriate orders placed.  [name] was informed that the remainder of the evaluation will be completed by another provider, this initial triage assessment does not replace that evaluation, and the importance of remaining in the ED until their evaluation is complete.     Romeo Apple, Makayla Confer A, PA-C 08/23/23 1635

## 2023-08-23 NOTE — ED Notes (Signed)
Messaged MD regarding pt's BP prior to medication administration

## 2023-08-23 NOTE — ED Provider Notes (Signed)
[facility] Provider Note    Event Date/Time   First MD Initiated Contact with Patient 08/23/23 1741     (approximate)   History   Abdominal Pain   HPI  Annette Gonzales is a 53 y.o. female who presents to the ER for evaluation of left lower quadrant Donnell pain.  Symptoms started about 2 to 3 days ago associate with some nausea but no vomiting.  States she started having few episodes of blood in her stool today.  She is not on any anticoagulation.  Does have a history of diverticulosis and had an episode of diverticulitis many years ago but this feels more severe.     Physical Exam   Triage Vital Signs: ED Triage Vitals  Encounter Vitals Group     BP 08/23/23 1222 110/70     Systolic BP Percentile --      Diastolic BP Percentile --      Pulse Rate 08/23/23 1222 (!) 123     Resp 08/23/23 1222 (!) 26     Temp 08/23/23 1222 98.5 F (36.9 C)     Temp Source 08/23/23 1222 Oral     SpO2 08/23/23 1222 96 %     Weight 08/23/23 1222 152 lb 1.9 oz (69 kg)     Height --      Head Circumference --      Peak Flow --      Pain Score 08/23/23 1222 8     Pain Loc --      Pain Education --      Exclude from Growth Chart --     Most recent vital signs: Vitals:   08/23/23 2041 08/23/23 2140  BP: (!) 88/65 105/68  Pulse: 86 87  Resp: 16 16  Temp:    SpO2: 96% 99%     Constitutional: Alert  Eyes: Conjunctivae are normal.  Head: Atraumatic. Nose: No congestion/rhinnorhea. Mouth/Throat: Mucous membranes are moist.   Neck: Painless ROM.  Cardiovascular:   Good peripheral circulation. Respiratory: Normal respiratory effort.  No retractions.  Gastrointestinal: Soft tenderness palpation left lower quadrant.  No guarding or rebound. Musculoskeletal:  no deformity Neurologic:  MAE spontaneously. No gross focal neurologic deficits are appreciated.  Skin:  Skin is warm, dry and intact. No rash noted. Psychiatric: Mood and affect are normal. Speech and  behavior are normal.    ED Results / Procedures / Treatments   Labs (all labs ordered are listed, but only abnormal results are displayed) Labs Reviewed  COMPREHENSIVE METABOLIC PANEL - Abnormal; Notable for the following components:      Result Value   Sodium 134 (*)    CO2 20 (*)    Glucose, Bld 165 (*)    Calcium 8.8 (*)    All other components within normal limits  CBC - Abnormal; Notable for the following components:   WBC 15.6 (*)    All other components within normal limits  URINALYSIS, ROUTINE W REFLEX MICROSCOPIC - Abnormal; Notable for the following components:   Color, Urine AMBER (*)    APPearance CLOUDY (*)    Protein, ur 30 (*)    Leukocytes,Ua MODERATE (*)    Non Squamous Epithelial PRESENT (*)    All other components within normal limits  LIPASE, BLOOD     EKG     RADIOLOGY Please see ED Course for my review and interpretation.  I personally reviewed all radiographic images ordered to evaluate for the above acute complaints and reviewed  radiology reports and findings.  These findings were personally discussed with the patient.  Please see medical record for radiology report.    PROCEDURES:  Critical Care performed:   Procedures   MEDICATIONS ORDERED IN ED: Medications  morphine (PF) 4 MG/ML injection 4 mg (4 mg Intravenous Given 08/23/23 1857)  acetaminophen (TYLENOL) tablet 1,000 mg (1,000 mg Oral Given 08/23/23 1653)  ondansetron (ZOFRAN-ODT) disintegrating tablet 4 mg (4 mg Oral Given 08/23/23 1654)  iohexol (OMNIPAQUE) 300 MG/ML solution 100 mL (100 mLs Intravenous Contrast Given 08/23/23 1750)  metoCLOPramide (REGLAN) injection 10 mg (10 mg Intravenous Given 08/23/23 1813)  sodium chloride 0.9 % bolus 1,000 mL (0 mLs Intravenous Stopped 08/23/23 1949)  piperacillin-tazobactam (ZOSYN) IVPB 3.375 g (0 g Intravenous Stopped 08/23/23 1949)  sodium chloride 0.9 % bolus 1,000 mL (0 mLs Intravenous Stopped 08/23/23 2152)     IMPRESSION / MDM /  ASSESSMENT AND PLAN / ED COURSE  I reviewed the triage vital signs and the nursing notes.                              Differential diagnosis includes, but is not limited to, diverticulitis, colitis, stone, pyelo-, perforation, abscess  Patient presenting to the ER for evaluation of symptoms as described above.  Based on symptoms, risk factors and considered above differential, this presenting complaint could reflect a potentially life-threatening illness therefore the patient will be placed on continuous pulse oximetry and telemetry for monitoring.  Laboratory evaluation will be sent to evaluate for the above complaints.      Clinical Course as of 08/23/23 2155  Wed Aug 23, 2023  1843 CT imaging with evidence of acute diverticulitis questionable abscess will await formal radiology report.  Will order IV antibiotics. [PR]  2125 Blood pressure is soft with patient's mentating well appears well-perfused no fever no tachycardia.  Patient receiving IV fluids will reassess. [PR]  2150 Patient reassessed.  Systolics in the 100s.  She is feeling well she has been up to the bathroom ambulating without any weakness or lightheadedness.  We discussed option for admission to the hospital for IV antibiotics further medical management versus a trial of outpatient management.  She would prefer trial of outpatient management she has tolerated p.o. and I think that is a reasonable plan.  They do seem reliable. [PR]    Clinical Course User Index [PR] Willy Eddy, MD     FINAL CLINICAL IMPRESSION(S) / ED DIAGNOSES   Final diagnoses:  Acute diverticulitis of intestine     Rx / DC Orders   ED Discharge Orders          Ordered    amoxicillin-clavulanate (AUGMENTIN) 875-125 MG tablet  2 times daily        08/23/23 2153    oxyCODONE-acetaminophen (PERCOCET) 5-325 MG tablet  Every 4 hours PRN        08/23/23 2153    ondansetron (ZOFRAN-ODT) 4 MG disintegrating tablet  Every 8 hours PRN         08/23/23 2153             Note:  This document was prepared using Dragon voice recognition software and may include unintentional dictation errors.    Willy Eddy, MD 08/23/23 2155

## 2023-08-23 NOTE — ED Notes (Signed)
Pt ambulatory to BR with assistance.  Pt is complaining of dizziness.  Will notify provider.

## 2023-08-23 NOTE — ED Triage Notes (Signed)
C/O LLQ and Left lower back pain x 3 days.  Also RLQ pain.  C/O bloating feeling.  C/O nausea on Monday.  Last BM yesterday morning, states passed dome bloody tissue.  Also c/O general weakness.

## 2023-08-23 NOTE — Telephone Encounter (Signed)
Can you please call pt to see if she wants to come in today for one of my same days? Did send a mychart message but this might be quicker.   This is what I messaged her just in case she didn't see it yet bc I just sent it. To eval whether er or come in to me  Quickly hop into your mychart and request appt you should be able to snag one this am with me. You can see me first and I can evaluate.    But if it is really bad and your forsee you're going to end up int he hospital anyway's for the pain and or because it is a kidney stone you can just go to the ER because you might need fluids, a CT scan and or breaking down of the stone to pass. Up to you!

## 2023-08-28 ENCOUNTER — Telehealth: Payer: Self-pay

## 2023-08-28 NOTE — Transitions of Care (Post Inpatient/ED Visit) (Unsigned)
pt seen [facility] ED 08/23/23 lt lower abd pain; pt dx with diverticulitis and pt is feeling better. pt is avoiding foods with seeds and nuts. pt taking augmentin and feels better. Pt no longer seeing blood in stool and no diarrhea or constipation now. Pt has another week of abx to take and pt will call if appt needed with PCP. Pt already has FU appt with GI on 09/25/23. UC & ED precautions given and pt voiced understanding; pt appreciated call. Sending note to Hayden Pedro FNP.          08/28/2023  Name: Annette Gonzales MRN: [id number] DOB: 1970/10/30  Today's TOC FU Call Status: Today's TOC FU Call Status:: Successful TOC FU Call Completed TOC FU Call Complete Date: 08/28/23 Patient's Name and Date of Birth confirmed.  Transition Care Management Follow-up Telephone Call Date of Discharge: 08/23/23 Discharge Facility[facility] Type of Discharge: Emergency Department Reason for ED Visit: Other: (pt seen [facility] ED 08/23/23 lt lower abd pain; pt dx with diverticulitis and pt is feeling better. pt is avoiding foods with seeds and nuts. pt taking augmentin and feels better.) How have you been since you were released from the hospital?: Better Any questions or concerns?: No  Items Reviewed: Did you receive and understand the discharge instructions provided?: Yes Medications obtained,verified, and reconciled?: Partial Review Completed Any new allergies since your discharge?: No Dietary orders reviewed?: Yes Type of Diet Ordered:: no seeds or nuts Do you have support at home?: Yes People in Home: child(ren), adult Name of Support/Comfort Primary Source: [place]  Medications Reviewed Today: Medications Reviewed Today   Medications were not reviewed in this encounter     Home Care and Equipment/Supplies: Were Home Health Services Ordered?: NA Any new equipment or medical supplies ordered?: NA  Functional Questionnaire: Do you need assistance with bathing/showering  or dressing?: No Do you need assistance with meal preparation?: No Do you need assistance with eating?: No Do you have difficulty maintaining continence: No Do you need assistance with getting out of bed/getting out of a chair/moving?: No Do you have difficulty managing or taking your medications?: No  Follow up appointments reviewed: PCP Follow-up appointment confirmed?: NA Specialist Hospital Follow-up appointment confirmed?: Yes Date of Specialist follow-up appointment?: 09/25/23 Follow-Up Specialty Provider:: Celso Amy PA Do you need transportation to your follow-up appointment?: No Do you understand care options if your condition(s) worsen?: Yes-patient verbalized understanding    SIGNATURE Lewanda Rife, LPN

## 2023-08-28 NOTE — Telephone Encounter (Signed)
Noted thank you for update agree with this plan.

## 2023-09-11 ENCOUNTER — Other Ambulatory Visit: Payer: Self-pay

## 2023-09-19 ENCOUNTER — Encounter: Payer: Managed Care, Other (non HMO) | Admitting: Obstetrics

## 2023-09-23 ENCOUNTER — Other Ambulatory Visit: Payer: Self-pay | Admitting: Family

## 2023-09-23 DIAGNOSIS — F418 Other specified anxiety disorders: Secondary | ICD-10-CM

## 2023-09-24 NOTE — Progress Notes (Unsigned)
 Celso Amy, PA-C [address]  [address]  [place], [place] [address]  Main: [phone]  Fax: [phone]   Primary Care Physician: Mort Sawyers, FNP  Primary Gastroenterologist:  Celso Amy, PA-C / Dr. Wyline Mood    CC: F/U Diverticulitis, GERD, and Gastritis  HPI: Annette Gonzales is a 53 y.o. female presents for f/u Diverticulitis.  Went to [facility] ED with 2-3 day history of 08/23/23 for LLQ pain.  Elevated WBC 15.6, Hgb 13.2g.  CT showed acute sigmoid diverticulitis with no perforation or abscess.  Treated with Augmentin 875mg  BID x 10 days.  Also had large left ovarian cyst.  Currently she is feeling a lot better.  She has no more LLQ pain.  She denies diarrhea, constipation, or rectal bleeding.  03/2023 Colonoscopy by Dr. Tobi Bastos: Pan-Diverticulosis; No Polyps; Excellent Prep.  10 year repeat.  03/2023 EGD: Normal esophagus, stomach, and duodenum.  Stomach biopsies showed mild gastritis and were negative for H. pylori.    She has occasional episode of acid reflux and epigastric burning after dietary indiscretions.  Current Outpatient Medications  Medication Sig Dispense Refill   albuterol (VENTOLIN HFA) 108 (90 Base) MCG/ACT inhaler Inhale 1-2 puffs into the lungs every 6 (six) hours as needed for wheezing or shortness of breath. 18 g 0   ALPRAZolam (XANAX) 0.5 MG tablet Take 0.5 mg by mouth at bedtime as needed for anxiety.     fluticasone (FLONASE) 50 MCG/ACT nasal spray Place 2 sprays into both nostrils daily. 16 g 2   levocetirizine (XYZAL) 5 MG tablet Take 5 mg by mouth every evening.     lisinopril (ZESTRIL) 10 MG tablet TAKE 1 TABLET BY MOUTH EVERY DAY 90 tablet 3   metFORMIN (GLUCOPHAGE) 500 MG tablet Take 1 tablet (500 mg total) by mouth 2 (two) times daily with a meal. 180 tablet 3   montelukast (SINGULAIR) 10 MG tablet TAKE 1 TABLET BY MOUTH EVERYDAY AT BEDTIME 90 tablet 0   ondansetron (ZOFRAN-ODT) 4 MG disintegrating tablet Take 1 tablet (4 mg total) by mouth  every 8 (eight) hours as needed for nausea or vomiting. 16 tablet 0   sertraline (ZOLOFT) 100 MG tablet Take two tablets once daily (Patient taking differently: Take 100 mg by mouth daily. Take two tablets once daily) 180 tablet 1   azelastine (ASTELIN) 0.1 % nasal spray Place 2 sprays into both nostrils 2 (two) times daily. Use in each nostril as directed 30 mL 12   No current facility-administered medications for this visit.    Allergies as of 09/25/2023 - Review Complete 09/25/2023  Allergen Reaction Noted   Sulfa antibiotics Other (See Comments) 04/21/2016    Past Medical History:  Diagnosis Date   Asthma    Depression    Diverticulitis    Frequent headaches    GERD (gastroesophageal reflux disease)    History of kidney stones    History of stomach ulcers history of mouth ulcers   Hyperlipidemia    Hypertension    Urinary tract bacterial infections     Past Surgical History:  Procedure Laterality Date   ABDOMINAL HYSTERECTOMY  2005   Uterus only   APPENDECTOMY  1985   BIOPSY  03/31/2023   Procedure: BIOPSY;  Surgeon: Wyline Mood, MD;  Location: [facility] ENDOSCOPY;  Service: Gastroenterology;;   COLONOSCOPY WITH PROPOFOL N/A 03/31/2023   Procedure: COLONOSCOPY WITH PROPOFOL;  Surgeon: Wyline Mood, MD;  Location: [facility] ENDOSCOPY;  Service: Gastroenterology;  Laterality: N/A;   ESOPHAGOGASTRODUODENOSCOPY (EGD)  WITH PROPOFOL N/A 03/31/2023   Procedure: ESOPHAGOGASTRODUODENOSCOPY (EGD) WITH PROPOFOL;  Surgeon: Wyline Mood, MD;  Location: [facility] ENDOSCOPY;  Service: Gastroenterology;  Laterality: N/A;   HERNIA REPAIR  2012   LEEP     TONSILLECTOMY     TONSILLECTOMY AND ADENOIDECTOMY  1989    Review of Systems:    All systems reviewed and negative except where noted in HPI.   Physical Examination:   BP 126/87   Pulse 90   Ht 5\' 2"  (1.575 m)   Wt 147 lb 11.2 oz (67 kg)   BMI 27.01 kg/m   General: Well-nourished, well-developed in no acute distress.  Neuro: Alert and oriented x  3.  Grossly intact.  Psych: Alert and cooperative, normal mood and affect.   Imaging Studies: No results found.  Assessment and Plan:   Annette Gonzales is a 53 y.o. y/o female   GERD Start OTC Pepcid 20 Mg twice daily or Prilosec 20 Mg once daily. Recommend Lifestyle Modifications to prevent Acid Reflux.  Rec. Avoid coffee, sodas, peppermint, garlic, onions, alcohol, citrus fruits, chocolate, tomatoes, fatty and spicey foods.  Avoid eating 2-3 hours before bedtime.    Gastritis  Avoid NSAIDs, spicy and acidic foods.  History of Diverticulitis -currently resolved  Patient education handout given and discussed from up-to-date.  Start Benefiber powder 1 to 2 tablespoons in a drink once daily.  Colon Cancer Screening  Negative colonoscopy 03/2023.  Repeat in 10 years.   Celso Amy, PA-C  Follow up as needed.

## 2023-09-25 ENCOUNTER — Encounter: Payer: Self-pay | Admitting: Physician Assistant

## 2023-09-25 ENCOUNTER — Other Ambulatory Visit: Payer: Self-pay | Admitting: Family

## 2023-09-25 ENCOUNTER — Ambulatory Visit: Payer: Managed Care, Other (non HMO) | Admitting: Physician Assistant

## 2023-09-25 VITALS — BP 126/87 | HR 90 | Ht 62.0 in | Wt 147.7 lb

## 2023-09-25 DIAGNOSIS — K219 Gastro-esophageal reflux disease without esophagitis: Secondary | ICD-10-CM | POA: Diagnosis not present

## 2023-09-25 DIAGNOSIS — J309 Allergic rhinitis, unspecified: Secondary | ICD-10-CM

## 2023-09-25 DIAGNOSIS — K2930 Chronic superficial gastritis without bleeding: Secondary | ICD-10-CM

## 2023-09-25 DIAGNOSIS — Z8719 Personal history of other diseases of the digestive system: Secondary | ICD-10-CM

## 2023-09-25 DIAGNOSIS — K2970 Gastritis, unspecified, without bleeding: Secondary | ICD-10-CM

## 2023-09-25 DIAGNOSIS — B9689 Other specified bacterial agents as the cause of diseases classified elsewhere: Secondary | ICD-10-CM

## 2023-09-26 ENCOUNTER — Other Ambulatory Visit: Payer: Self-pay | Admitting: Family

## 2023-09-26 DIAGNOSIS — J309 Allergic rhinitis, unspecified: Secondary | ICD-10-CM

## 2023-09-26 MED ORDER — ALBUTEROL SULFATE HFA 108 (90 BASE) MCG/ACT IN AERS: 1.0000 | INHALATION_SPRAY | Freq: Four times a day (QID) | RESPIRATORY_TRACT | 0 refills | Status: DC | PRN

## 2023-09-26 MED ORDER — MONTELUKAST SODIUM 10 MG PO TABS: 10.0000 mg | ORAL_TABLET | Freq: Every day | ORAL | 3 refills | Status: AC

## 2023-10-04 ENCOUNTER — Other Ambulatory Visit (HOSPITAL_COMMUNITY)
Admission: RE | Admit: 2023-10-04 | Discharge: 2023-10-04 | Disposition: A | Discharge status: Home / Self Care | Source: Ambulatory Visit | Attending: Obstetrics | Admitting: Obstetrics

## 2023-10-04 ENCOUNTER — Ambulatory Visit: Payer: Managed Care, Other (non HMO) | Admitting: Obstetrics

## 2023-10-04 ENCOUNTER — Encounter: Payer: Self-pay | Admitting: Obstetrics

## 2023-10-04 VITALS — BP 109/72 | HR 94 | Ht 62.0 in | Wt 147.0 lb

## 2023-10-04 DIAGNOSIS — Z803 Family history of malignant neoplasm of breast: Secondary | ICD-10-CM

## 2023-10-04 DIAGNOSIS — Z124 Encounter for screening for malignant neoplasm of cervix: Secondary | ICD-10-CM | POA: Diagnosis present

## 2023-10-04 DIAGNOSIS — N951 Menopausal and female climacteric states: Secondary | ICD-10-CM | POA: Diagnosis not present

## 2023-10-04 DIAGNOSIS — L900 Lichen sclerosus et atrophicus: Secondary | ICD-10-CM

## 2023-10-04 MED ORDER — VEOZAH 45 MG PO TABS: 1.0000 | ORAL_TABLET | Freq: Every day | ORAL | 0 refills | Status: DC

## 2023-10-04 MED ORDER — CLOBETASOL PROPIONATE 0.05 % EX OINT: 1.0000 | TOPICAL_OINTMENT | Freq: Two times a day (BID) | CUTANEOUS | 5 refills | Status: AC

## 2023-10-04 NOTE — Progress Notes (Signed)
 GYNECOLOGY PROGRESS NOTE  Subjective:  PCP: Mort Sawyers, FNP  Patient ID: Annette Gonzales, female    DOB: [birth year], 53 y.o.   MRN: [id number]  HPI  Patient is a 53 y.o. G51P0202 female who presents as a referral from NP Tabitah Dugal for Lichen Sclerosus. Pt diagnosed 09/13/18 by biopsy and has been well-controlled on Temovate ointment with flares only, needs refills today. Does occasional self-checks and no new lesions. Over the past 52yrs, reports 3-4 flares. Currently without any symptoms.  Also experiencing hot flashes interfering with her ADLs. Also experiencing dry skin. Denies vaginal dryness and moods stable, is on Zoloft managed by psychiatry. Is s/p hysterectomy but still has cervix and needs pap updated. Mammograms done with PCP and pt states she is scheduled. Her mother has hx of breast cancer, dx in her 23s, had lumpectomy and radiation, no chemo required. Pt unsure of genetic status and interested in BRCA testing today. States her PCP said she would not feel comfortable prescribing HRT.   OB History     Gravida  2   Para  2   Term      Preterm  2   AB      Living  2      SAB      IAB      Ectopic      Multiple      Live Births             Past Medical History:  Diagnosis Date   Asthma    Depression    Diverticulitis    Frequent headaches    GERD (gastroesophageal reflux disease)    History of kidney stones    History of stomach ulcers history of mouth ulcers   Hyperlipidemia    Hypertension    Urinary tract bacterial infections    Past Surgical History:  Procedure Laterality Date   ABDOMINAL HYSTERECTOMY  2005   Uterus only   APPENDECTOMY  1985   BIOPSY  03/31/2023   Procedure: BIOPSY;  Surgeon: Wyline Mood, MD;  Location: [facility] ENDOSCOPY;  Service: Gastroenterology;;   COLONOSCOPY WITH PROPOFOL N/A 03/31/2023   Procedure: COLONOSCOPY WITH PROPOFOL;  Surgeon: Wyline Mood, MD;  Location: [facility] ENDOSCOPY;  Service: Gastroenterology;   Laterality: N/A;   ESOPHAGOGASTRODUODENOSCOPY (EGD) WITH PROPOFOL N/A 03/31/2023   Procedure: ESOPHAGOGASTRODUODENOSCOPY (EGD) WITH PROPOFOL;  Surgeon: Wyline Mood, MD;  Location: [facility] ENDOSCOPY;  Service: Gastroenterology;  Laterality: N/A;   HERNIA REPAIR  2012   LEEP     TONSILLECTOMY     TONSILLECTOMY AND ADENOIDECTOMY  1989   Family History  Problem Relation Age of Onset   Arthritis Mother    Breast cancer Mother    Heart disease Mother        Mitral valve disease   Hyperlipidemia Father    Heart disease Father    Stroke Father    Hypertension Father    Hypertension Sister    Heart disease Sister        tachycardia s/p ablation   Arthritis Maternal Grandmother    Mental illness Maternal Grandmother    Cancer Maternal Grandfather        Colon/ Prostate Cancer   Heart disease Maternal Grandfather    Stroke Maternal Grandfather    Diabetes Maternal Grandfather    Arthritis Paternal Grandmother    Hypertension Paternal Grandmother        renal artery stenosis   Heart attack Paternal Grandfather    Social History  Socioeconomic History   Marital status: Married    Spouse name: Not on file   Number of children: Not on file   Years of education: Not on file   Highest education level: Not on file  Occupational History   Occupation: unemployed    Comment: previous patient care coordinator  Tobacco Use   Smoking status: Never   Smokeless tobacco: Never  Vaping Use   Vaping status: Never Used  Substance and Sexual Activity   Alcohol use: Yes    Comment: occasionally   Drug use: No   Sexual activity: Yes    Partners: Male  Other Topics Concern   Not on file  Social History Narrative   Married   unemployed   Some College    2 children   Social Drivers of Health   Financial Resource Strain: Low Risk  (07/30/2023)   Overall Financial Resource Strain (CARDIA)    Difficulty of Paying Living Expenses: Not hard at all  Food Insecurity: No Food Insecurity (07/30/2023)    Hunger Vital Sign    Worried About Running Out of Food in the Last Year: Never true    Ran Out of Food in the Last Year: Never true  Transportation Needs: No Transportation Needs (07/30/2023)   PRAPARE - Administrator, Civil Service (Medical): No    Lack of Transportation (Non-Medical): No  Physical Activity: Insufficiently Active (07/30/2023)   Exercise Vital Sign    Days of Exercise per Week: 3 days    Minutes of Exercise per Session: 10 min  Stress: Stress Concern Present (07/30/2023)   [employer] of Occupational Health - Occupational Stress Questionnaire    Feeling of Stress : To some extent  Social Connections: Unknown (07/30/2023)   Social Connection and Isolation Panel [NHANES]    Frequency of Communication with Friends and Family: Patient declined    Frequency of Social Gatherings with Friends and Family: Patient declined    Attends Religious Services: Patient declined    Database administrator or Organizations: Patient declined    Attends Engineer, structural: Not on file    Marital Status: Married  Catering manager Violence: Not on file   Current Outpatient Medications on File Prior to Visit  Medication Sig Dispense Refill   albuterol (VENTOLIN HFA) 108 (90 Base) MCG/ACT inhaler Inhale 1-2 puffs into the lungs every 6 (six) hours as needed for wheezing or shortness of breath. 18 g 0   ALPRAZolam (XANAX) 0.5 MG tablet Take 0.5 mg by mouth at bedtime as needed for anxiety.     fluticasone (FLONASE) 50 MCG/ACT nasal spray Place 2 sprays into both nostrils daily. 16 g 2   levocetirizine (XYZAL) 5 MG tablet Take 5 mg by mouth every evening.     lisinopril (ZESTRIL) 10 MG tablet TAKE 1 TABLET BY MOUTH EVERY DAY 90 tablet 3   metFORMIN (GLUCOPHAGE) 500 MG tablet Take 1 tablet (500 mg total) by mouth 2 (two) times daily with a meal. 180 tablet 3   montelukast (SINGULAIR) 10 MG tablet Take 1 tablet (10 mg total) by mouth at bedtime. 90 tablet 3   ondansetron  (ZOFRAN-ODT) 4 MG disintegrating tablet Take 1 tablet (4 mg total) by mouth every 8 (eight) hours as needed for nausea or vomiting. 16 tablet 0   sertraline (ZOLOFT) 100 MG tablet Take two tablets once daily (Patient taking differently: Take 100 mg by mouth daily. Take two tablets once daily) 180 tablet 1   No  current facility-administered medications on file prior to visit.   Allergies  Allergen Reactions   Sulfa Antibiotics Other (See Comments)    Nausea and hot flashes   Review of Systems Pertinent items are noted in HPI.   Objective:   Blood pressure 109/72, pulse 94, height 5\' 2"  (1.575 m), weight 147 lb (66.7 kg). Body mass index is 26.89 kg/m.  General appearance: alert, cooperative, and no distress Abdomen: soft, non-tender; bowel sounds normal; no masses,  no organomegaly Pelvic: cervix normal in appearance, external genitalia normal, no adnexal masses or tenderness, no cervical motion tenderness, rectovaginal septum normal, uterus surgically absent, vagina normal without discharge, and no evidence of lichen sclerosus at this time.  Extremities: extremities normal, atraumatic, no cyanosis or edema Neurologic: Grossly normal  Assessment/Plan:   1. Lichen sclerosus   2. Hot flashes due to menopause   3. Cervical cancer screening   4. Family history of malignant neoplasm of female breast     Annette Gonzales is a 53 y.o. [phone] with chronic, well-controlled LS, menopausal hot flashes, and wanting to discuss HRT, [facility] of breast cancer in her mother.  LS: -Stable, recommend monthly SVE w/mirror and reporting any visual changes or new symptoms -Temovate refilled, use during flares  2. Hot flashes:  -Reviewed risks of HRT and feel pt would be a reasonable candidate if she stays current on her pap/mammogram/colon screenings. Because her main symptoms are vasomotor, we discussed alternatives to HRT today. Is stable on Zoloft, would not introduce another SSRI/SNRI at this time. We  discussed Veozah and pt would like to trial. 2wk samples given, Rx sent, coupon card provided. Risks of liver injury discussed.  -CMP today, then at 3/6/9 mos  3. Cervical cancer screen:  -Pap updated today  4. [facility] of breast cancer:  -MyRisk testing done today  Follow up in 3 mos for hot flashes and CMP, sooner prn.    Julieanne Manson, DO [facility]

## 2023-10-05 LAB — COMPREHENSIVE METABOLIC PANEL
ALT: 16 IU/L (ref 0–32)
AST: 22 IU/L (ref 0–40)
Albumin: 4.6 g/dL (ref 3.8–4.9)
Alkaline Phosphatase: 110 IU/L (ref 44–121)
BUN/Creatinine Ratio: 15 (ref 9–23)
BUN: 10 mg/dL (ref 6–24)
Bilirubin Total: 0.3 mg/dL (ref 0.0–1.2)
CO2: 20 mmol/L (ref 20–29)
Calcium: 9.8 mg/dL (ref 8.7–10.2)
Chloride: 103 mmol/L (ref 96–106)
Creatinine, Ser: 0.68 mg/dL (ref 0.57–1.00)
Globulin, Total: 2.6 g/dL (ref 1.5–4.5)
Glucose: 88 mg/dL (ref 70–99)
Potassium: 4.6 mmol/L (ref 3.5–5.2)
Sodium: 141 mmol/L (ref 134–144)
Total Protein: 7.2 g/dL (ref 6.0–8.5)
eGFR: 105 mL/min/{1.73_m2} (ref >59)

## 2023-10-06 LAB — CYTOLOGY - PAP
Comment: NEGATIVE
Diagnosis: NEGATIVE
High risk HPV: NEGATIVE

## 2023-10-09 ENCOUNTER — Encounter: Payer: Self-pay | Admitting: Obstetrics

## 2023-12-12 ENCOUNTER — Encounter: Payer: Self-pay | Admitting: Family

## 2023-12-22 ENCOUNTER — Encounter: Payer: Self-pay | Admitting: Obstetrics

## 2023-12-24 ENCOUNTER — Encounter: Payer: Self-pay | Admitting: Family

## 2023-12-25 ENCOUNTER — Other Ambulatory Visit: Payer: Self-pay | Admitting: Family

## 2023-12-25 DIAGNOSIS — Z1231 Encounter for screening mammogram for malignant neoplasm of breast: Secondary | ICD-10-CM

## 2023-12-26 ENCOUNTER — Other Ambulatory Visit: Payer: Self-pay | Admitting: Family

## 2023-12-26 ENCOUNTER — Other Ambulatory Visit
Admission: RE | Admit: 2023-12-26 | Discharge: 2023-12-26 | Disposition: A | Discharge status: Home / Self Care | Payer: Self-pay | Source: Ambulatory Visit | Attending: Oncology | Admitting: Oncology

## 2023-12-26 DIAGNOSIS — Z1231 Encounter for screening mammogram for malignant neoplasm of breast: Secondary | ICD-10-CM

## 2023-12-26 DIAGNOSIS — Z006 Encounter for examination for normal comparison and control in clinical research program: Secondary | ICD-10-CM | POA: Insufficient documentation

## 2023-12-28 ENCOUNTER — Telehealth: Payer: Self-pay | Admitting: Medical Genetics

## 2023-12-28 DIAGNOSIS — Z006 Encounter for examination for normal comparison and control in clinical research program: Secondary | ICD-10-CM

## 2023-12-28 NOTE — Telephone Encounter (Signed)
 [place] [facility]  12/28/2023 11:17 AM  Confirmed I was speaking with Barbette Boom [id number] by using name and DOB. Informed participant the reason for this call is to follow-up on a recent sample the participant provided at one of the [facility] lab locations. Informed participant the test was not able to be completed with this sample and apologized for the inconvenience. Participant was requested to provide a new sample at one of our participating labs at no cost so that participant can continue participation and receive test results. Participant agreed to provide another sample. Participant was provided the [employer] program website to learn why this may have happened. Participant was thanked for their time and continued support of the above study.

## 2023-12-29 ENCOUNTER — Other Ambulatory Visit

## 2024-01-01 ENCOUNTER — Other Ambulatory Visit

## 2024-01-03 ENCOUNTER — Encounter

## 2024-01-13 ENCOUNTER — Other Ambulatory Visit: Payer: Self-pay | Attending: Medical Genetics

## 2024-01-15 ENCOUNTER — Ambulatory Visit: Admitting: Obstetrics

## 2024-03-06 DIAGNOSIS — K219 Gastro-esophageal reflux disease without esophagitis: Secondary | ICD-10-CM | POA: Diagnosis not present

## 2024-03-06 DIAGNOSIS — Z882 Allergy status to sulfonamides status: Secondary | ICD-10-CM | POA: Diagnosis not present

## 2024-03-06 DIAGNOSIS — K5792 Diverticulitis of intestine, part unspecified, without perforation or abscess without bleeding: Secondary | ICD-10-CM | POA: Diagnosis not present

## 2024-03-06 DIAGNOSIS — N949 Unspecified condition associated with female genital organs and menstrual cycle: Secondary | ICD-10-CM | POA: Diagnosis not present

## 2024-03-06 DIAGNOSIS — E785 Hyperlipidemia, unspecified: Secondary | ICD-10-CM | POA: Diagnosis not present

## 2024-03-06 DIAGNOSIS — K5732 Diverticulitis of large intestine without perforation or abscess without bleeding: Secondary | ICD-10-CM | POA: Diagnosis not present

## 2024-03-06 DIAGNOSIS — I10 Essential (primary) hypertension: Secondary | ICD-10-CM | POA: Diagnosis not present

## 2024-03-06 DIAGNOSIS — N838 Other noninflammatory disorders of ovary, fallopian tube and broad ligament: Secondary | ICD-10-CM | POA: Diagnosis not present

## 2024-03-06 DIAGNOSIS — R7303 Prediabetes: Secondary | ICD-10-CM | POA: Diagnosis not present

## 2024-03-07 DIAGNOSIS — R109 Unspecified abdominal pain: Secondary | ICD-10-CM | POA: Diagnosis not present

## 2024-04-22 NOTE — Progress Notes (Unsigned)
    GYNECOLOGY PROGRESS NOTE  Subjective:  PCP: Corwin Antu, FNP  Patient ID: Annette Gonzales, female    DOB: 03-25-1971, 53 y.o.   MRN: [id number]  HPI  Patient is a 53 y.o. G2P0202 female who presents for discussion of menopausal symptoms  She was seen in March '25 and started on Veozah  for hot flashes, unable to get the Veozah  due to insurance issues.  She has started using Estradiol patches prescribed through an online clinic since June.  Was prescribed Climara 0.075.  She feels like it has been helpful for her hot flashes, brain fog, moodiness, and achiness.  She would like to get those through [facility] from now on. Is s/p abd hysterectomy, still has cervix.   Last pap: 10/04/23 NILM, HPV Last mammogram: 2023, was scheduled Jun '25, but lost insurance [facility] of breast cancer: mother; insurance denied [facility] MyRisk testing  The following portions of the patient's history were reviewed and updated as appropriate: allergies, current medications, past family history, past medical history, past social history, past surgical history, and problem list.  Review of Systems Pertinent items are noted in HPI.   Objective:   Blood pressure (!) 142/97, pulse (!) 111, height 5' 2 (1.575 m), weight 152 lb 12.8 oz (69.3 kg). Body mass index is 27.95 kg/m.  General appearance: alert and cooperative Abdomen: soft, non-tender; bowel sounds normal; no masses,  no organomegaly Pelvic: deferred Extremities: extremities normal, atraumatic, no cyanosis or edema Neurologic: Grossly normal   Assessment/Plan:   1. Menopausal and female climacteric states   2. Encounter for screening mammogram for malignant neoplasm of breast    53 y.o. G2P0202 with PMH of stable LS, menopausal hot flashes, and [facility] of breast cancer in her mother.  Last visit we had discussed HRT and felt pt was a reasonable candidate. She preferred to go the non-hormonal route due to her [facility] and we pursued MyRisk genetic testing,  her insurance wouldn't cover, and she was unable to obtain Veozah  due to insurance.  Today she has already been taking Climara for the past 3 mos through an online service and would like to continue. She is s/p hysterectomy and is currently on the 0.075mg  and is requesting an increase for symptoms. Also working through some environmental stressors (spouse's work, ageing parents) and some of her symptoms may not be hormonally related.  -Will increase Climara to 0.1mg  for 3 mos; Rx sent today. Pt will message/call us  and let us  know which dose she prefers, will send in 9 additional months supply at that time.  -Mammogram order placed today - if not completed within the next 3 mos, will not refill until done.  -Follow up 12 mos for annual and refills, sooner prn.   Total time was 30 minutes. That includes chart review before the visit, the actual patient visit, and time spent on documentation after the visit. Time excludes procedures, if any.     Estil Mangle, DO [facility]

## 2024-04-24 ENCOUNTER — Encounter: Payer: Self-pay | Admitting: Obstetrics

## 2024-04-24 ENCOUNTER — Ambulatory Visit: Payer: Self-pay | Admitting: Obstetrics

## 2024-04-24 ENCOUNTER — Ambulatory Visit (INDEPENDENT_AMBULATORY_CARE_PROVIDER_SITE_OTHER): Admitting: Obstetrics

## 2024-04-24 VITALS — BP 142/97 | HR 111 | Ht 62.0 in | Wt 152.8 lb

## 2024-04-24 DIAGNOSIS — N951 Menopausal and female climacteric states: Secondary | ICD-10-CM

## 2024-04-24 DIAGNOSIS — Z1231 Encounter for screening mammogram for malignant neoplasm of breast: Secondary | ICD-10-CM

## 2024-04-24 MED ORDER — ESTRADIOL 0.1 MG/24HR TD PTWK: 0.1000 mg | MEDICATED_PATCH | TRANSDERMAL | 0 refills | Status: DC

## 2024-04-24 NOTE — Patient Instructions (Signed)
 Menopause: What to Know Menopause is the time in your life when your menstrual periods stop. It marks the end of your ability to get pregnant. It can be defined as not having a period for 12 months without another medical cause. The time when you start to move into menopause is called perimenopause. It often happens between ages 67-55. It can last for many years. During perimenopause, hormone levels change in your body. This can cause symptoms and affect your health. Menopause may make you more likely to have: Bones that are weak and break more easily. Depression. This is when you feel sad or hopeless. Arteries that harden and get narrow. These can cause heart attacks and strokes. What are the causes? In most cases, menopause is a natural change to your body and hormone levels that happens as you get older. But in some cases, it may be caused by changes that aren't natural. These include: Surgery to take out both ovaries. Side effects from some medicines. What increases the risk? You're more likely to go through menopause early if: You have an abnormal growth (tumor) of the pituitary gland in your brain. You have a disease that affects your ovaries. You've had certain treatments for cancer. These include: Chemotherapy. Hormone therapy. Radiation therapy on the area between your hips (pelvis). You smoke a lot or drink a lot of alcohol. Other people in your family have gone through menopause early. You're very thin. What are the signs or symptoms? You may have: Hot flashes. Irregular periods. Night sweats. Changes in how you feel about sex. You may: Have less of a sex drive. Feel more discomfort around your sexuality. Vaginal dryness and thinning of the vaginal walls. This may make it hurt to have sex. Skin changes, such as: Dry skin. New wrinkles. Headaches. Other symptoms may include: Trouble sleeping. Mood swings. Memory problems. Weight gain. Hair growth on your face and  chest. Bladder infections or trouble peeing. How is this diagnosed? You may be diagnosed based on: Your medical history. An exam. Your age. Your history of menstrual periods. Your symptoms. Hormone tests. How is this treated? In some cases, no treatment is needed. Talk with your health care provider about if you should get treated. Treatments may include: Menopausal hormone therapy (MHT). Medicines to treat certain symptoms. Acupuncture. Vitamin or herbal supplements. Before you start treatment, let your provider know if you or anyone in your family has or has had: Heart disease. Breast cancer. Blood clots. Diabetes. Osteoporosis. Follow these instructions at home: Eating and drinking  Eat a balanced diet. It should include: Fresh fruits and vegetables. Whole grains. Lean protein. Low-fat dairy. Eat lots of foods that have calcium and vitamin D in them. These can help keep your bones healthy. Foods and drinks that are rich in calcium include: Yogurt and low-fat milk. Beans. Almonds. Sardines. Broccoli and kale. To help prevent hot flashes, stay away from: Alcohol. Drinks with caffeine in them. Spicy foods. Lifestyle Do not smoke, vape, or use nicotine or tobacco. Get 7-8 hours of sleep each night. If you have hot flashes, you may want to: Dress in layers. Avoid things that may trigger hot flashes, like warm places or stress. Take slow, deep breaths when a hot flash starts. Keep a fan in your home and office. Find ways to manage stress. You may want to try: Deep breathing. Meditation. Writing in a journal. Ask your provider about going to group therapy. Therapy can help you get support from others who are going  through menopause. General instructions  Talk with your provider before you take any herbal supplements. Keep track of your symptoms. Track: When they start. How often you have them. How long they last. Use vaginal lubricants or moisturizers. These  can help with: Vaginal dryness. Comfort during sex. Contact a health care provider if: You're older than 55 and still get periods. You have pain during sex. You haven't had a period for 12 months and then start to bleed from your vagina. It hurts to pee. You get very bad headaches. Get help right away if: You're very depressed. You have a lot of bleeding from your vagina. Your heart is beating too fast. You have very bad belly pain or indigestion that doesn't go away with medicines. This information is not intended to replace advice given to you by your health care provider. Make sure you discuss any questions you have with your health care provider. Document Revised: 03/16/2023 Document Reviewed: 03/16/2023 Elsevier Patient Education  2024 [employer].

## 2024-04-25 ENCOUNTER — Ambulatory Visit
Admission: RE | Admit: 2024-04-25 | Discharge: 2024-04-25 | Disposition: A | Discharge status: Home / Self Care | Source: Ambulatory Visit | Attending: Family | Admitting: Family

## 2024-04-25 ENCOUNTER — Encounter: Payer: Self-pay | Admitting: Family

## 2024-04-25 DIAGNOSIS — F418 Other specified anxiety disorders: Secondary | ICD-10-CM

## 2024-04-25 DIAGNOSIS — Z1231 Encounter for screening mammogram for malignant neoplasm of breast: Secondary | ICD-10-CM | POA: Insufficient documentation

## 2024-04-29 MED ORDER — SERTRALINE HCL 50 MG PO TABS: 50.0000 mg | ORAL_TABLET | Freq: Every day | ORAL | 0 refills | Status: DC

## 2024-04-29 NOTE — Addendum Note (Signed)
 Addended by: CORWIN ANTU on: 04/29/2024 01:51 PM   Modules accepted: Orders

## 2024-04-30 ENCOUNTER — Other Ambulatory Visit: Payer: Self-pay | Admitting: Family

## 2024-04-30 DIAGNOSIS — R928 Other abnormal and inconclusive findings on diagnostic imaging of breast: Secondary | ICD-10-CM

## 2024-05-01 ENCOUNTER — Ambulatory Visit
Admission: RE | Admit: 2024-05-01 | Discharge: 2024-05-01 | Disposition: A | Discharge status: Home / Self Care | Source: Ambulatory Visit | Attending: Family | Admitting: Family

## 2024-05-01 DIAGNOSIS — R92333 Mammographic heterogeneous density, bilateral breasts: Secondary | ICD-10-CM | POA: Diagnosis not present

## 2024-05-01 DIAGNOSIS — N6002 Solitary cyst of left breast: Secondary | ICD-10-CM | POA: Diagnosis not present

## 2024-05-01 DIAGNOSIS — R928 Other abnormal and inconclusive findings on diagnostic imaging of breast: Secondary | ICD-10-CM | POA: Diagnosis not present

## 2024-05-02 ENCOUNTER — Ambulatory Visit: Payer: Self-pay | Admitting: Family

## 2024-05-02 NOTE — Progress Notes (Signed)
 noted

## 2024-05-15 ENCOUNTER — Encounter: Payer: Self-pay | Admitting: Family

## 2024-05-15 DIAGNOSIS — J208 Acute bronchitis due to other specified organisms: Secondary | ICD-10-CM

## 2024-05-16 MED ORDER — ALBUTEROL SULFATE HFA 108 (90 BASE) MCG/ACT IN AERS: 1.0000 | INHALATION_SPRAY | Freq: Four times a day (QID) | RESPIRATORY_TRACT | 0 refills | Status: DC | PRN
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
## 2024-05-22 ENCOUNTER — Ambulatory Visit: Admitting: Psychiatry

## 2024-05-22 ENCOUNTER — Encounter: Payer: Self-pay | Admitting: Psychiatry

## 2024-05-22 DIAGNOSIS — F411 Generalized anxiety disorder: Secondary | ICD-10-CM

## 2024-05-22 DIAGNOSIS — Z5181 Encounter for therapeutic drug level monitoring: Secondary | ICD-10-CM | POA: Diagnosis not present

## 2024-05-22 DIAGNOSIS — F330 Major depressive disorder, recurrent, mild: Secondary | ICD-10-CM

## 2024-05-22 DIAGNOSIS — F4310 Post-traumatic stress disorder, unspecified: Secondary | ICD-10-CM

## 2024-05-22 DIAGNOSIS — Z79899 Other long term (current) drug therapy: Secondary | ICD-10-CM | POA: Diagnosis not present

## 2024-05-22 NOTE — Patient Instructions (Signed)
 Continue sertraline  50 mg daily  Obtain lab (TSH, urine screening) Plan to switch from Xanax to clonazepam 0.25 mg daily as needed for anxiety after reviewing urine test Referral to therapy at our clinic Next appointment- 12/16 at 8:30

## 2024-05-24 ENCOUNTER — Other Ambulatory Visit: Payer: Self-pay | Admitting: Medical Genetics

## 2024-05-24 DIAGNOSIS — Z006 Encounter for examination for normal comparison and control in clinical research program: Secondary | ICD-10-CM

## 2024-05-29 ENCOUNTER — Other Ambulatory Visit: Payer: Self-pay

## 2024-05-29 ENCOUNTER — Ambulatory Visit

## 2024-05-29 VITALS — BP 116/82 | Resp 16 | Ht 62.0 in | Wt 152.0 lb

## 2024-05-29 DIAGNOSIS — J3089 Other allergic rhinitis: Secondary | ICD-10-CM | POA: Diagnosis not present

## 2024-05-29 DIAGNOSIS — Z23 Encounter for immunization: Secondary | ICD-10-CM | POA: Diagnosis not present

## 2024-05-29 DIAGNOSIS — F418 Other specified anxiety disorders: Secondary | ICD-10-CM

## 2024-05-29 DIAGNOSIS — E785 Hyperlipidemia, unspecified: Secondary | ICD-10-CM

## 2024-05-29 DIAGNOSIS — Z7985 Long-term (current) use of injectable non-insulin antidiabetic drugs: Secondary | ICD-10-CM | POA: Diagnosis not present

## 2024-05-29 DIAGNOSIS — E1169 Type 2 diabetes mellitus with other specified complication: Secondary | ICD-10-CM | POA: Diagnosis not present

## 2024-05-29 DIAGNOSIS — E119 Type 2 diabetes mellitus without complications: Secondary | ICD-10-CM

## 2024-05-29 DIAGNOSIS — K219 Gastro-esophageal reflux disease without esophagitis: Secondary | ICD-10-CM | POA: Diagnosis not present

## 2024-05-29 DIAGNOSIS — I10 Essential (primary) hypertension: Secondary | ICD-10-CM | POA: Diagnosis not present

## 2024-05-29 DIAGNOSIS — R5383 Other fatigue: Secondary | ICD-10-CM | POA: Diagnosis not present

## 2024-05-29 MED ORDER — SEMAGLUTIDE(0.25 OR 0.5MG/DOS) 2 MG/3ML ~~LOC~~ SOPN: 0.2500 mg | PEN_INJECTOR | SUBCUTANEOUS | 0 refills | Status: DC

## 2024-05-29 NOTE — Progress Notes (Signed)
 New patient visit   Patient: Annette Gonzales   DOB: 30-May-1971   53 y.o. Female  MRN: [id number] Visit Date: 05/29/2024  Today's healthcare provider: Isaiah DELENA Pepper, MD   Chief Complaint  Patient presents with   Transitions Of Care    TOC/ Labs( Thyroid )   Subjective    Annette Gonzales is a 52 y.o. female who presents today as a new patient to establish care.   Discussed the use of AI scribe software for clinical note transcription with the patient, who gave verbal consent to proceed.  History of Present Illness Annette Gonzales is a 53 year old female with diabetes who presents for a follow-up and lab work.  She has a history of elevated A1c levels, with the last measurement being 6.9 approximately ten months ago, leading to a diagnosis of diabetes. She was previously prescribed metformin  but experienced severe acid reflux and two episodes of diverticulitis after starting the medication, leading to its discontinuation. No further episodes of diverticulitis have occurred since stopping metformin .  She has a family history of diabetes and acknowledges a predisposition to the condition. Despite attempting dietary changes, she struggles with weight loss, particularly since menopause. She recently started on estrogen patches, which she feels may be contributing to weight gain. She is responsible for caring for her parents, which impacts her ability to focus on her own health.  She is also here to have her thyroid  checked as recommended by her psychiatric doctor, who plans to switch her from Xanax to clonazepam pending the results. She has no family history of thyroid  cancer, but her mother takes medication for thyroid  issues. No hx of pancreatitis.  She has a history of high triglycerides, inherited from her father, and is concerned about her weight and its impact on her health. She has not been on any weight loss medications before and is open to trying new treatments.  Her current  medications include Climara patch estrogen, albuterol  inhaler as needed, Flonase  as needed, Xyzal and Singulair  at night for allergies, lisinopril  for blood pressure, and Zoloft  50 mg. She also takes Pepcid as needed for acid reflux.   Past Medical History:  Diagnosis Date   Asthma    Depression    Diverticulitis    Frequent headaches    GERD (gastroesophageal reflux disease)    History of kidney stones    History of stomach ulcers history of mouth ulcers   Hyperlipidemia    Hypertension    Urinary tract bacterial infections    Past Surgical History:  Procedure Laterality Date   ABDOMINAL HYSTERECTOMY  2005   Uterus only   APPENDECTOMY  1985   BIOPSY  03/31/2023   Procedure: BIOPSY;  Surgeon: Therisa Bi, MD;  Location: [facility] ENDOSCOPY;  Service: Gastroenterology;;   COLONOSCOPY WITH PROPOFOL  N/A 03/31/2023   Procedure: COLONOSCOPY WITH PROPOFOL ;  Surgeon: Therisa Bi, MD;  Location: [facility] ENDOSCOPY;  Service: Gastroenterology;  Laterality: N/A;   ESOPHAGOGASTRODUODENOSCOPY (EGD) WITH PROPOFOL  N/A 03/31/2023   Procedure: ESOPHAGOGASTRODUODENOSCOPY (EGD) WITH PROPOFOL ;  Surgeon: Therisa Bi, MD;  Location: [facility] ENDOSCOPY;  Service: Gastroenterology;  Laterality: N/A;   HERNIA REPAIR  2012   LEEP     TONSILLECTOMY     TONSILLECTOMY AND ADENOIDECTOMY  1989   Family Status  Relation Name Status   Mother  Alive   Father  Alive   Sister  Alive   MGM  Deceased   MGF  Deceased   PGM  Alive  PGF  Alive  No partnership data on file   Family History  Problem Relation Age of Onset   Arthritis Mother    Breast cancer Mother 75   Heart disease Mother        Mitral valve disease   Hyperlipidemia Father    Heart disease Father    Stroke Father    Hypertension Father    Hypertension Sister    Heart disease Sister        tachycardia s/p ablation   Arthritis Maternal Grandmother    Mental illness Maternal Grandmother    Cancer Maternal Grandfather        Colon/ Prostate Cancer    Heart disease Maternal Grandfather    Stroke Maternal Grandfather    Diabetes Maternal Grandfather    Arthritis Paternal Grandmother    Hypertension Paternal Grandmother        renal artery stenosis   Heart attack Paternal Grandfather    Social History   Socioeconomic History   Marital status: Married    Spouse name: Not on file   Number of children: Not on file   Years of education: Not on file   Highest education level: 12th grade  Occupational History   Occupation: unemployed    Comment: previous patient care coordinator  Tobacco Use   Smoking status: Never   Smokeless tobacco: Never  Vaping Use   Vaping status: Never Used  Substance and Sexual Activity   Alcohol use: Yes    Comment: occasionally   Drug use: No   Sexual activity: Yes    Partners: Male  Other Topics Concern   Not on file  Social History Narrative   Married   unemployed   Some College    2 children   Social Drivers of Health   Financial Resource Strain: Patient Declined (05/28/2024)   Overall Financial Resource Strain (CARDIA)    Difficulty of Paying Living Expenses: Patient declined  Food Insecurity: Patient Declined (05/28/2024)   Hunger Vital Sign    Worried About Running Out of Food in the Last Year: Patient declined    Ran Out of Food in the Last Year: Patient declined  Transportation Needs: No Transportation Needs (05/28/2024)   PRAPARE - Administrator, Civil Service (Medical): No    Lack of Transportation (Non-Medical): No  Physical Activity: Insufficiently Active (05/28/2024)   Exercise Vital Sign    Days of Exercise per Week: 2 days    Minutes of Exercise per Session: 20 min  Stress: Stress Concern Present (05/28/2024)   [employer] of Occupational Health - Occupational Stress Questionnaire    Feeling of Stress: Very much  Social Connections: Moderately Isolated (05/28/2024)   Social Connection and Isolation Panel    Frequency of Communication with Friends and Family:  Three times a week    Frequency of Social Gatherings with Friends and Family: Once a week    Attends Religious Services: Never    Database Administrator or Organizations: No    Attends Engineer, Structural: Not on file    Marital Status: Married   Outpatient Medications Prior to Visit  Medication Sig   albuterol  (VENTOLIN  HFA) 108 (90 Base) MCG/ACT inhaler Inhale 1-2 puffs into the lungs every 6 (six) hours as needed for wheezing or shortness of breath.   ALPRAZolam (XANAX) 0.5 MG tablet Take 0.5 mg by mouth at bedtime as needed for anxiety.   clobetasol  ointment (TEMOVATE ) 0.05 % Apply 1 Application topically 2 (two)  times daily. Apply to affected area during flares until symptoms subside.   estradiol (CLIMARA - DOSED IN MG/24 HR) 0.1 mg/24hr patch Place 1 patch (0.1 mg total) onto the skin once a week.   fluticasone  (FLONASE ) 50 MCG/ACT nasal spray Place 2 sprays into both nostrils daily.   levocetirizine (XYZAL) 5 MG tablet Take 5 mg by mouth every evening.   lisinopril  (ZESTRIL ) 10 MG tablet TAKE 1 TABLET BY MOUTH EVERY DAY   montelukast  (SINGULAIR ) 10 MG tablet Take 1 tablet (10 mg total) by mouth at bedtime.   ondansetron  (ZOFRAN -ODT) 4 MG disintegrating tablet Take 1 tablet (4 mg total) by mouth every 8 (eight) hours as needed for nausea or vomiting.   sertraline  (ZOLOFT ) 50 MG tablet Take 1 tablet (50 mg total) by mouth daily.   [DISCONTINUED] Fezolinetant  (VEOZAH ) 45 MG TABS Take 1 tablet (45 mg total) by mouth daily. (Patient not taking: Reported on 05/29/2024)   [DISCONTINUED] metFORMIN  (GLUCOPHAGE ) 500 MG tablet Take 1 tablet (500 mg total) by mouth 2 (two) times daily with a meal. (Patient not taking: Reported on 05/29/2024)   No facility-administered medications prior to visit.   Allergies  Allergen Reactions   Trazodone  And Nefazodone Shortness Of Breath   Metformin  And Related Other (See Comments)   Sulfa Antibiotics Other (See Comments)    Nausea and hot flashes     Reviews of Systems as noted in HPI.       Objective    BP 116/82 (BP Location: Left Arm, Patient Position: Sitting)   Resp 16   Ht 5' 2 (1.575 m)   Wt 152 lb (68.9 kg)   SpO2 100%   BMI 27.80 kg/m      Physical Exam Constitutional:      Appearance: Normal appearance.  HENT:     Head: Normocephalic and atraumatic.     Mouth/Throat:     Mouth: Mucous membranes are moist.  Eyes:     Pupils: Pupils are equal, round, and reactive to light.  Cardiovascular:     Rate and Rhythm: Normal rate and regular rhythm.     Heart sounds: Normal heart sounds.  Pulmonary:     Effort: Pulmonary effort is normal.     Breath sounds: Normal breath sounds.  Skin:    General: Skin is warm.  Neurological:     General: No focal deficit present.     Mental Status: She is alert.    Depression Screen    05/29/2024    9:35 AM 08/03/2023   11:11 AM 08/03/2022   11:22 AM 12/30/2021   11:35 AM  PHQ 2/9 Scores  PHQ - 2 Score 2 2 1  0  PHQ- 9 Score 10 14 3     No results found for any visits on 05/29/24.  Assessment & Plan      Problem List Items Addressed This Visit       Cardiovascular and Mediastinum   Benign essential HTN     Respiratory   Other allergic rhinitis     Digestive   GERD (gastroesophageal reflux disease)     Endocrine   Hyperlipidemia associated with type 2 diabetes mellitus (HCC)   Relevant Orders   Lipid panel   Type 2 diabetes mellitus without complication, without long-term current use of insulin (HCC) - Primary   Relevant Orders   Hemoglobin A1c     Other   Depression with anxiety   Other Visit Diagnoses       Other fatigue  Relevant Orders   TSH     Need for vaccination       Relevant Orders   Flu vaccine trivalent PF, 6mos and older(Flulaval,Afluria,Fluarix,Fluzone) (Completed)       Assessment and Plan Assessment & Plan Type 2 diabetes mellitus A1c 6.9. Intolerant to metformin  due to acid reflux and diverticulitis. Family history  and lifestyle contribute to diabetes. - Order A1c test today - Prescribe Ozempic, start with lowest dose for one month, then increase. - Monitor for side effects: constipation, nausea. - Educated on pancreatitis risk, report severe abdominal pain. - Follow up in three months.  Hyperlipidemia Slightly elevated triglycerides, inherited. Prefers no medication for cholesterol. - Order cholesterol panel.  Gastroesophageal reflux disease (GERD) Chronic, controlled. Heartburn worsened by metformin , managed with Pepcid. - Continue Pepcid as needed. - Consider prescription medication if symptoms worsen.  Essential hypertension Chronic, well-controlled with lisinopril . - Continue lisinopril  10mg  - Monitor blood pressure regularly.  Allergic rhinitis Managed with Flonase  and Xyzal. - Continue Xyzal nightly. - Continue Flonase  as needed.  Fatigue May be related to thyroid  dysfunction. Patient's mother has hypothyroidism. - Will check TSH  Depression Managed with Zoloft  50 mg daily. Transitioning from Xanax to clonazepam, pending TSH results. Follows with psychiatry. - Continue Zoloft  50 mg daily and Xanax - Follow up with psychiatry   Return in about 3 months (around 08/29/2024) for Follow Up diabetes and HTN.      Isaiah DELENA Pepper, MD  [facility] [phone] (phone) [phone] (fax)

## 2024-05-30 ENCOUNTER — Ambulatory Visit: Payer: Self-pay

## 2024-05-30 ENCOUNTER — Other Ambulatory Visit (HOSPITAL_COMMUNITY): Payer: Self-pay

## 2024-05-30 ENCOUNTER — Telehealth: Payer: Self-pay

## 2024-05-30 LAB — LIPID PANEL
Chol/HDL Ratio: 5.6 ratio — ABNORMAL HIGH (ref 0.0–4.4)
Cholesterol, Total: 229 mg/dL — ABNORMAL HIGH (ref 100–199)
HDL: 41 mg/dL (ref >39)
LDL Chol Calc (NIH): 131 mg/dL — ABNORMAL HIGH (ref 0–99)
Triglycerides: 320 mg/dL — ABNORMAL HIGH (ref 0–149)
VLDL Cholesterol Cal: 57 mg/dL — ABNORMAL HIGH (ref 5–40)

## 2024-05-30 LAB — TSH: TSH: 2.72 u[IU]/mL (ref 0.450–4.500)

## 2024-05-30 LAB — HEMOGLOBIN A1C
Est. average glucose Bld gHb Est-mCnc: 134 mg/dL
Hgb A1c MFr Bld: 6.3 % — ABNORMAL HIGH (ref 4.8–5.6)

## 2024-05-30 NOTE — Telephone Encounter (Signed)
 Pharmacy Patient Advocate Encounter   Received notification from Physician's Office that prior authorization for Ozempic (0.25 or 0.5 MG/DOSE) 2MG /3ML pen-injectors is required/requested.   Insurance verification completed.   The patient is insured through [employer].   Per test claim: PA required; PA submitted to above mentioned insurance via Latent Key/confirmation #/EOC [id number] Status is pending

## 2024-05-30 NOTE — Telephone Encounter (Signed)
 PA request has been Submitted. New Encounter has been or will be created for follow up. For additional info see Pharmacy Prior Auth telephone encounter from 05/30/2024.

## 2024-06-03 ENCOUNTER — Other Ambulatory Visit (HOSPITAL_COMMUNITY): Payer: Self-pay

## 2024-06-05 ENCOUNTER — Other Ambulatory Visit (HOSPITAL_COMMUNITY): Payer: Self-pay

## 2024-06-05 NOTE — Telephone Encounter (Signed)
 Pharmacy Patient Advocate Encounter  Received notification from [employer] that Prior Authorization for Ozempic (0.25 or 0.5 MG/DOSE) 2MG /3ML pen-injectors  has been APPROVED from 05/30/24 to 05/30/25. Unable to obtain price due to refill too soon rejection, last fill date 05/30/24 next available fill date12/18/25   PA #/Case ID/Reference #: [id number]

## 2024-06-17 ENCOUNTER — Other Ambulatory Visit: Payer: Self-pay

## 2024-06-17 MED ORDER — LEVOCETIRIZINE DIHYDROCHLORIDE 5 MG PO TABS: 5.0000 mg | ORAL_TABLET | Freq: Every evening | ORAL | 1 refills | Status: AC

## 2024-06-26 LAB — GENECONNECT MOLECULAR SCREEN: Genetic Analysis Overall Interpretation: NEGATIVE

## 2024-07-02 ENCOUNTER — Encounter: Admitting: Internal Medicine

## 2024-07-06 ENCOUNTER — Other Ambulatory Visit: Payer: Self-pay

## 2024-07-06 DIAGNOSIS — E119 Type 2 diabetes mellitus without complications: Secondary | ICD-10-CM
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
## 2024-07-09 ENCOUNTER — Ambulatory Visit: Admitting: Psychiatry

## 2024-07-09 MED ORDER — OZEMPIC (0.25 OR 0.5 MG/DOSE) 2 MG/3ML ~~LOC~~ SOPN: 0.5000 mg | PEN_INJECTOR | SUBCUTANEOUS | 1 refills | Status: AC

## 2024-07-16 ENCOUNTER — Encounter: Payer: Self-pay | Admitting: Obstetrics

## 2024-07-16 ENCOUNTER — Other Ambulatory Visit: Payer: Self-pay | Admitting: Obstetrics

## 2024-07-16 DIAGNOSIS — N951 Menopausal and female climacteric states: Secondary | ICD-10-CM

## 2024-07-19 MED ORDER — ESTRADIOL 0.1 MG/24HR TD PTWK: 0.1000 mg | MEDICATED_PATCH | TRANSDERMAL | 2 refills | Status: AC

## 2024-07-19 NOTE — Telephone Encounter (Signed)
 Spoke with patient. Clarified that she would like the current dose sent to [facility] [address] with 2 refills sent (9 mos).

## 2024-07-26 ENCOUNTER — Other Ambulatory Visit: Payer: Self-pay

## 2024-07-26 DIAGNOSIS — F418 Other specified anxiety disorders: Secondary | ICD-10-CM

## 2024-07-26 MED ORDER — SERTRALINE HCL 50 MG PO TABS: 50.0000 mg | ORAL_TABLET | Freq: Every day | ORAL | 3 refills | Status: AC

## 2024-07-26 NOTE — Telephone Encounter (Signed)
"  Refill sent  "

## 2024-08-06 ENCOUNTER — Other Ambulatory Visit: Payer: Self-pay

## 2024-08-06 MED ORDER — ONDANSETRON 4 MG PO TBDP: 4.0000 mg | ORAL_TABLET | Freq: Three times a day (TID) | ORAL | 0 refills | Status: AC | PRN

## 2024-08-11 DIAGNOSIS — B9689 Other specified bacterial agents as the cause of diseases classified elsewhere: Secondary | ICD-10-CM

## 2024-08-12 MED ORDER — ALBUTEROL SULFATE HFA 108 (90 BASE) MCG/ACT IN AERS: 1.0000 | INHALATION_SPRAY | Freq: Four times a day (QID) | RESPIRATORY_TRACT | 2 refills | Status: AC | PRN
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
## 2024-08-27 ENCOUNTER — Telehealth: Admitting: Psychiatry

## 2024-08-27 ENCOUNTER — Encounter: Payer: Self-pay | Admitting: Psychiatry

## 2024-08-27 DIAGNOSIS — F4310 Post-traumatic stress disorder, unspecified: Secondary | ICD-10-CM

## 2024-08-27 DIAGNOSIS — F411 Generalized anxiety disorder: Secondary | ICD-10-CM | POA: Diagnosis not present

## 2024-08-27 DIAGNOSIS — F3341 Major depressive disorder, recurrent, in partial remission: Secondary | ICD-10-CM

## 2024-08-27 NOTE — Patient Instructions (Signed)
 Continue sertraline  50 mg daily  Obtain lab (urine drug screening) Next appointment- 4/9 at 10 AM

## 2024-08-30 ENCOUNTER — Ambulatory Visit

## 2024-09-02 ENCOUNTER — Ambulatory Visit

## 2024-09-11 ENCOUNTER — Ambulatory Visit: Admitting: Licensed Clinical Social Worker

## 2024-10-31 ENCOUNTER — Ambulatory Visit: Admitting: Psychiatry
